# Patient Record
Sex: Male | Born: 1935 | Race: White | Hispanic: No | Marital: Married | Smoking: Former smoker
Health system: Southern US, Community
[De-identification: ages and names within clinical notes are randomized; demographics above are authoritative.]

## PROBLEM LIST (undated history)

## (undated) DIAGNOSIS — I358 Other nonrheumatic aortic valve disorders: Secondary | ICD-10-CM

## (undated) DIAGNOSIS — J189 Pneumonia, unspecified organism: Secondary | ICD-10-CM

## (undated) DIAGNOSIS — K219 Gastro-esophageal reflux disease without esophagitis: Secondary | ICD-10-CM

## (undated) DIAGNOSIS — J349 Unspecified disorder of nose and nasal sinuses: Secondary | ICD-10-CM

## (undated) DIAGNOSIS — I509 Heart failure, unspecified: Secondary | ICD-10-CM

## (undated) DIAGNOSIS — C787 Secondary malignant neoplasm of liver and intrahepatic bile duct: Secondary | ICD-10-CM

## (undated) DIAGNOSIS — I517 Cardiomegaly: Secondary | ICD-10-CM

## (undated) DIAGNOSIS — Z9981 Dependence on supplemental oxygen: Secondary | ICD-10-CM

## (undated) DIAGNOSIS — I10 Essential (primary) hypertension: Secondary | ICD-10-CM

## (undated) DIAGNOSIS — C3491 Malignant neoplasm of unspecified part of right bronchus or lung: Secondary | ICD-10-CM

## (undated) DIAGNOSIS — R202 Paresthesia of skin: Secondary | ICD-10-CM

## (undated) DIAGNOSIS — D509 Iron deficiency anemia, unspecified: Secondary | ICD-10-CM

## (undated) DIAGNOSIS — N179 Acute kidney failure, unspecified: Secondary | ICD-10-CM

## (undated) DIAGNOSIS — H9190 Unspecified hearing loss, unspecified ear: Secondary | ICD-10-CM

## (undated) DIAGNOSIS — D649 Anemia, unspecified: Secondary | ICD-10-CM

## (undated) DIAGNOSIS — G43909 Migraine, unspecified, not intractable, without status migrainosus: Secondary | ICD-10-CM

## (undated) DIAGNOSIS — Z7189 Other specified counseling: Secondary | ICD-10-CM

## (undated) DIAGNOSIS — R0600 Dyspnea, unspecified: Secondary | ICD-10-CM

## (undated) DIAGNOSIS — Z9289 Personal history of other medical treatment: Secondary | ICD-10-CM

## (undated) DIAGNOSIS — Z95828 Presence of other vascular implants and grafts: Secondary | ICD-10-CM

## (undated) DIAGNOSIS — I340 Nonrheumatic mitral (valve) insufficiency: Secondary | ICD-10-CM

## (undated) HISTORY — DX: Unspecified hearing loss, unspecified ear: H91.90

## (undated) HISTORY — DX: Other specified counseling: Z71.89

## (undated) HISTORY — PX: CATARACT EXTRACTION W/ INTRAOCULAR LENS  IMPLANT, BILATERAL: SHX1307

## (undated) HISTORY — DX: Unspecified disorder of nose and nasal sinuses: J34.9

## (undated) HISTORY — PX: LIVER BIOPSY: SHX301

## (undated) HISTORY — DX: Malignant neoplasm of unspecified part of right bronchus or lung: C34.91

## (undated) HISTORY — PX: TONSILLECTOMY: SUR1361

## (undated) HISTORY — PX: COLONOSCOPY: SHX174

## (undated) HISTORY — PX: VASECTOMY: SHX75

## (undated) HISTORY — DX: Secondary malignant neoplasm of liver and intrahepatic bile duct: C78.7

---

## 2013-11-03 ENCOUNTER — Ambulatory Visit (INDEPENDENT_AMBULATORY_CARE_PROVIDER_SITE_OTHER): Payer: Medicare Other

## 2013-11-03 VITALS — BP 155/84 | HR 79 | Resp 18

## 2013-11-03 DIAGNOSIS — M2040 Other hammer toe(s) (acquired), unspecified foot: Secondary | ICD-10-CM

## 2013-11-03 DIAGNOSIS — Q828 Other specified congenital malformations of skin: Secondary | ICD-10-CM

## 2013-11-03 NOTE — Progress Notes (Signed)
   Subjective:    Patient ID: Harry Hinton, male    DOB: 05-19-1933, 80 y.o.   MRN: [id number]  HPI I have a 3rd toe on right foot and I went to doctor and he shaved it off and then it came back and it has been 5 years ago and painful with shoes and I have tried everything over the counter    Review of Systems  Constitutional: Negative.   HENT: Positive for hearing loss and sinus pressure.   Eyes: Negative.   Respiratory: Negative.   Cardiovascular: Negative.   Gastrointestinal:       Bloating  Endocrine: Negative.   Genitourinary: Negative.   Musculoskeletal: Negative.   Skin: Negative.   Allergic/Immunologic: Negative.   Neurological: Negative.   Hematological: Bruises/bleeds easily.  Psychiatric/Behavioral: Negative.        Objective:   Physical Exam Neurovascular status is intact with pedal pulses palpable DP postal for PT +24 bilateral Refill time 3 seconds all digits neurologically epicritic and progressive sensations intact and symmetric bilateral. There is normal plantar response DTRs not elicited dermatologically there is a distal clavus and keratoses distal aspect third digit right foot slight flexible contractures identified. Should note on the right foot is second third toes are significant longer than the hallux is slight bunion and the hallux as well the left foot has a slightly shorter second and third toe comparison to the hallux. Patient has no significant promontory changes has been wearing a size 9-1/2 or 10 shoe in the past however this time measurements taken patient is actually a size 10 a half to on more accurately an 11 D. or 11 medium shoe. Patient is advised she is to allow for space the end of the shoe from his longest toe not the great toe. No secondary infections no is a size lymphangitis although there was suspicion of a verruca this does not appear to have enucleation or verruca at this time although cannot rule out without it actually biopsy or excision.  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

## 2013-11-03 NOTE — Patient Instructions (Signed)
Corns and Calluses A thickening of the skin layer (usually over bony areas, such as toe joints) is known as a corn. Two types of corns exist: hard corns and soft corns. Calluses are painless areas of skin thickening that are caused by repeated pressure or irritation. Corns tend to affect toe joints and the skin between the toes; whereas, a callus can appear on any part of the body (especially the hands, feet, or knees).  SYMPTOMS   Corn:  Presence of a small (1/8 to 3/8 inch [3 to 10 mm in diameter]), painful bump on the side or over the joint of a toe.  Hard corns are more common on the outer portion of the little (fifth) toe at the joint.  Soft corns are more common between bony bumps (prominences), usually between the fourth and fifth toes or between the second and third toes.  Callus:  A rough, thickened area of skin that appears after repeated pressure or irritation. CAUSES  The purpose of corns and calluses is to protect an area of skin from injury caused by repeated irritation (rubbing or squeezing). The presence of pressure causes the skin cells to grow at a faster rate than the cells of unaffected areas. This leads to an overgrowth (corn or callus). As apposed to hard corns, soft corns tend to develop between toes, because there is more moisture. Soft corns are often the result of prolonged shoe wear, which leads to increased perspiration and moisture.  RISK INCREASES WITH:  Shoes that are too tight.  Occupations or sports that involve repetitive pressure on the hands (racquetball and baseball) or sudden stops on hard surfaces (track and tennis).  Sports that require the athlete to wear shoes, perspire, or wear clothing or protective gear that causes the production of heat and friction. PREVENTION  Properly fitted shoes and equipment.  Modify activities to prevent constant pressure on specific areas of skin.  If possible, wear padding over areas of skin that are exposed to  repeated pressure or irritation.  Keep the area between the toes dry (with powder or by removing shoes often).  Relieve shoe pressure by stretching the areas of the shoe that cause the pressure and or use ointments to soften leather shoes. PROGNOSIS  Corns and calluses typically subside if the activity that causes them is eliminated. Recovery may take up to 3 weeks. Recurrence is likely even with treatment if the cause is not removed.  RELATED COMPLICATIONS  If one overcompensates in an attempt to avoid pain, he or she may experience pain in other areas due to the changes in body movements (mechanics). TREATMENT  The best way to treat corns and calluses is to remove the source of pressure. Corn and callus pads may be helpful in reducing pressure on the affected skin. For soft corns, try to keep the affected area dry. If you cannot find shoes that fit properly, a shoe repair shop may be able to alter your shoes to reduce pressure. Occasionally a cushion for the bottom of the foot (metatarsal bar) worn within the shoe may relieve pressure on corns or calluses of the foot. For calluses, you may be able to peel or rub the thickened area with a pumice stone, sandstone, callus file, or with sandpaper to remove the callus; wetting the affected area may make this process more effective. Do not cut the corn or callus with a razor or knife. If the corn or callus must be removed, then a medically trained person should perform  the procedure. After peeling away the upper layers of a corn once or twice a day, it may be recommended to apply a non-prescription 5% to 84% salicylic ointment and cover the area with a bandage. It very uncommon to have the bony bumps (at toe joints) surgically removed. MEDICATION   If pain medication is necessary, nonsteroidal anti-inflammatory medications, such as aspirin and ibuprofen, or other minor pain relievers, such as acetaminophen, are often recommended. Contact your caregiver  immediately if any bleeding, stomach upset, or signs of an allergic reaction occur.  Topical salicylic ointments (5% to 10%) may be of benefit.  Prescription pain medications may be given by a caregiver. Use only as directed and only as much as you need.  Soak the foot for 20 minutes, twice a day, in a gallon of warm water. This may help to soften corns and calluses. Care should be taken to thoroughly dry the foot, especially between the toes, after soaking. SEEK MEDICAL CARE IF:   Symptoms get worse or do not improve in 2 weeks despite treatment.  Any signs of infection develop, including redness, swelling, increased pain or tenderness, or increased warmth around the corn or callus.  New, unexplained symptoms develop (drugs used in treatment may produce side effects). Document Released: 09/29/2005 Document Revised: 12/22/2011 Document Reviewed: 01/11/2009 [facility] Patient Information 2014 [place], [place].

## 2015-01-04 DIAGNOSIS — H43813 Vitreous degeneration, bilateral: Secondary | ICD-10-CM | POA: Diagnosis not present

## 2015-01-04 DIAGNOSIS — H5203 Hypermetropia, bilateral: Secondary | ICD-10-CM | POA: Diagnosis not present

## 2015-03-30 DIAGNOSIS — H25811 Combined forms of age-related cataract, right eye: Secondary | ICD-10-CM | POA: Diagnosis not present

## 2015-03-30 DIAGNOSIS — H04123 Dry eye syndrome of bilateral lacrimal glands: Secondary | ICD-10-CM | POA: Diagnosis not present

## 2015-04-10 DIAGNOSIS — Z87891 Personal history of nicotine dependence: Secondary | ICD-10-CM | POA: Diagnosis not present

## 2015-04-10 DIAGNOSIS — H25811 Combined forms of age-related cataract, right eye: Secondary | ICD-10-CM | POA: Diagnosis not present

## 2015-04-10 DIAGNOSIS — H9190 Unspecified hearing loss, unspecified ear: Secondary | ICD-10-CM | POA: Diagnosis not present

## 2015-04-10 DIAGNOSIS — Z7982 Long term (current) use of aspirin: Secondary | ICD-10-CM | POA: Diagnosis not present

## 2015-04-10 DIAGNOSIS — H259 Unspecified age-related cataract: Secondary | ICD-10-CM | POA: Diagnosis not present

## 2015-05-22 DIAGNOSIS — H269 Unspecified cataract: Secondary | ICD-10-CM | POA: Diagnosis not present

## 2015-05-22 DIAGNOSIS — H25812 Combined forms of age-related cataract, left eye: Secondary | ICD-10-CM | POA: Diagnosis not present

## 2015-05-22 DIAGNOSIS — Z7982 Long term (current) use of aspirin: Secondary | ICD-10-CM | POA: Diagnosis not present

## 2015-05-22 DIAGNOSIS — Z87891 Personal history of nicotine dependence: Secondary | ICD-10-CM | POA: Diagnosis not present

## 2015-05-22 DIAGNOSIS — H9190 Unspecified hearing loss, unspecified ear: Secondary | ICD-10-CM | POA: Diagnosis not present

## 2015-06-21 DIAGNOSIS — Z961 Presence of intraocular lens: Secondary | ICD-10-CM | POA: Diagnosis not present

## 2016-03-20 DIAGNOSIS — H353111 Nonexudative age-related macular degeneration, right eye, early dry stage: Secondary | ICD-10-CM | POA: Diagnosis not present

## 2016-03-20 DIAGNOSIS — Z961 Presence of intraocular lens: Secondary | ICD-10-CM | POA: Diagnosis not present

## 2016-11-03 DIAGNOSIS — Z961 Presence of intraocular lens: Secondary | ICD-10-CM | POA: Diagnosis not present

## 2016-11-03 DIAGNOSIS — H5203 Hypermetropia, bilateral: Secondary | ICD-10-CM | POA: Diagnosis not present

## 2017-03-09 ENCOUNTER — Encounter (HOSPITAL_COMMUNITY): Payer: Self-pay | Admitting: *Deleted

## 2017-03-09 ENCOUNTER — Inpatient Hospital Stay (HOSPITAL_COMMUNITY)
Admission: AD | Admit: 2017-03-09 | Discharge: 2017-03-12 | DRG: 166 | Disposition: A | Discharge status: Home / Self Care | Payer: Medicare Other | Source: Other Acute Inpatient Hospital | Attending: Internal Medicine | Admitting: Internal Medicine

## 2017-03-09 ENCOUNTER — Inpatient Hospital Stay (HOSPITAL_COMMUNITY): Payer: Medicare Other

## 2017-03-09 DIAGNOSIS — R918 Other nonspecific abnormal finding of lung field: Secondary | ICD-10-CM | POA: Diagnosis not present

## 2017-03-09 DIAGNOSIS — J9811 Atelectasis: Secondary | ICD-10-CM | POA: Diagnosis not present

## 2017-03-09 DIAGNOSIS — I744 Embolism and thrombosis of arteries of extremities, unspecified: Secondary | ICD-10-CM | POA: Diagnosis not present

## 2017-03-09 DIAGNOSIS — C3490 Malignant neoplasm of unspecified part of unspecified bronchus or lung: Secondary | ICD-10-CM | POA: Diagnosis not present

## 2017-03-09 DIAGNOSIS — C3402 Malignant neoplasm of left main bronchus: Secondary | ICD-10-CM | POA: Diagnosis present

## 2017-03-09 DIAGNOSIS — Z7982 Long term (current) use of aspirin: Secondary | ICD-10-CM

## 2017-03-09 DIAGNOSIS — R42 Dizziness and giddiness: Secondary | ICD-10-CM

## 2017-03-09 DIAGNOSIS — I358 Other nonrheumatic aortic valve disorders: Secondary | ICD-10-CM

## 2017-03-09 DIAGNOSIS — D638 Anemia in other chronic diseases classified elsewhere: Secondary | ICD-10-CM | POA: Diagnosis present

## 2017-03-09 DIAGNOSIS — H9190 Unspecified hearing loss, unspecified ear: Secondary | ICD-10-CM | POA: Diagnosis present

## 2017-03-09 DIAGNOSIS — E875 Hyperkalemia: Secondary | ICD-10-CM | POA: Diagnosis present

## 2017-03-09 DIAGNOSIS — R896 Abnormal cytological findings in specimens from other organs, systems and tissues: Secondary | ICD-10-CM | POA: Diagnosis not present

## 2017-03-09 DIAGNOSIS — I160 Hypertensive urgency: Secondary | ICD-10-CM | POA: Diagnosis not present

## 2017-03-09 DIAGNOSIS — R590 Localized enlarged lymph nodes: Secondary | ICD-10-CM | POA: Diagnosis not present

## 2017-03-09 DIAGNOSIS — Z720 Tobacco use: Secondary | ICD-10-CM

## 2017-03-09 DIAGNOSIS — J189 Pneumonia, unspecified organism: Secondary | ICD-10-CM | POA: Diagnosis not present

## 2017-03-09 DIAGNOSIS — I7102 Dissection of abdominal aorta: Secondary | ICD-10-CM | POA: Diagnosis not present

## 2017-03-09 DIAGNOSIS — J9809 Other diseases of bronchus, not elsewhere classified: Secondary | ICD-10-CM | POA: Diagnosis not present

## 2017-03-09 DIAGNOSIS — I10 Essential (primary) hypertension: Secondary | ICD-10-CM | POA: Diagnosis not present

## 2017-03-09 DIAGNOSIS — Z87891 Personal history of nicotine dependence: Secondary | ICD-10-CM | POA: Diagnosis not present

## 2017-03-09 DIAGNOSIS — R0789 Other chest pain: Secondary | ICD-10-CM | POA: Diagnosis not present

## 2017-03-09 DIAGNOSIS — N179 Acute kidney failure, unspecified: Secondary | ICD-10-CM | POA: Diagnosis not present

## 2017-03-09 DIAGNOSIS — R072 Precordial pain: Secondary | ICD-10-CM | POA: Diagnosis not present

## 2017-03-09 DIAGNOSIS — C779 Secondary and unspecified malignant neoplasm of lymph node, unspecified: Secondary | ICD-10-CM | POA: Diagnosis present

## 2017-03-09 DIAGNOSIS — C3432 Malignant neoplasm of lower lobe, left bronchus or lung: Secondary | ICD-10-CM | POA: Diagnosis not present

## 2017-03-09 DIAGNOSIS — R042 Hemoptysis: Secondary | ICD-10-CM | POA: Diagnosis present

## 2017-03-09 DIAGNOSIS — I714 Abdominal aortic aneurysm, without rupture, unspecified: Secondary | ICD-10-CM

## 2017-03-09 DIAGNOSIS — E860 Dehydration: Secondary | ICD-10-CM | POA: Diagnosis not present

## 2017-03-09 DIAGNOSIS — I2510 Atherosclerotic heart disease of native coronary artery without angina pectoris: Secondary | ICD-10-CM | POA: Diagnosis not present

## 2017-03-09 DIAGNOSIS — E871 Hypo-osmolality and hyponatremia: Secondary | ICD-10-CM | POA: Diagnosis not present

## 2017-03-09 DIAGNOSIS — I77811 Abdominal aortic ectasia: Secondary | ICD-10-CM | POA: Diagnosis not present

## 2017-03-09 DIAGNOSIS — I739 Peripheral vascular disease, unspecified: Secondary | ICD-10-CM | POA: Diagnosis not present

## 2017-03-09 DIAGNOSIS — R079 Chest pain, unspecified: Secondary | ICD-10-CM | POA: Diagnosis not present

## 2017-03-09 HISTORY — DX: Other nonrheumatic aortic valve disorders: I35.8

## 2017-03-09 LAB — VITAMIN B12: Vitamin B-12: 409 pg/mL (ref 180–914)

## 2017-03-09 LAB — COMPREHENSIVE METABOLIC PANEL
ALBUMIN: 3.1 g/dL — AB (ref 3.5–5.0)
ALT: 14 U/L — ABNORMAL LOW (ref 17–63)
ANION GAP: 6 (ref 5–15)
AST: 19 U/L (ref 15–41)
Alkaline Phosphatase: 88 U/L (ref 38–126)
BILIRUBIN TOTAL: 0.3 mg/dL (ref 0.3–1.2)
BUN: 20 mg/dL (ref 6–20)
CO2: 28 mmol/L (ref 22–32)
Calcium: 8.4 mg/dL — ABNORMAL LOW (ref 8.9–10.3)
Chloride: 101 mmol/L (ref 101–111)
Creatinine, Ser: 1.18 mg/dL (ref 0.61–1.24)
GFR calc Af Amer: >60 mL/min (ref >60)
GFR calc non Af Amer: 56 mL/min — ABNORMAL LOW (ref >60)
GLUCOSE: 116 mg/dL — AB (ref 65–99)
POTASSIUM: 4.4 mmol/L (ref 3.5–5.1)
SODIUM: 135 mmol/L (ref 135–145)
Total Protein: 8 g/dL (ref 6.5–8.1)

## 2017-03-09 LAB — PROTIME-INR
INR: 1.03
Prothrombin Time: 13.5 seconds (ref 11.4–15.2)

## 2017-03-09 LAB — TSH: TSH: 1.295 u[IU]/mL (ref 0.350–4.500)

## 2017-03-09 LAB — CBC
HCT: 35.9 % — ABNORMAL LOW (ref 39.0–52.0)
Hemoglobin: 11.8 g/dL — ABNORMAL LOW (ref 13.0–17.0)
MCH: 28.3 pg (ref 26.0–34.0)
MCHC: 32.9 g/dL (ref 30.0–36.0)
MCV: 86.1 fL (ref 78.0–100.0)
Platelets: 266 10*3/uL (ref 150–400)
RBC: 4.17 MIL/uL — ABNORMAL LOW (ref 4.22–5.81)
RDW: 14.1 % (ref 11.5–15.5)
WBC: 10.6 10*3/uL — ABNORMAL HIGH (ref 4.0–10.5)

## 2017-03-09 LAB — IRON AND TIBC
IRON: 28 ug/dL — AB (ref 45–182)
Saturation Ratios: 11 % — ABNORMAL LOW (ref 17.9–39.5)
TIBC: 246 ug/dL — ABNORMAL LOW (ref 250–450)
UIBC: 218 ug/dL

## 2017-03-09 LAB — LACTATE DEHYDROGENASE: LDH: 126 U/L (ref 98–192)

## 2017-03-09 LAB — RETICULOCYTES
RBC.: 4.17 MIL/uL — ABNORMAL LOW (ref 4.22–5.81)
RETIC COUNT ABSOLUTE: 50 10*3/uL (ref 19.0–186.0)
Retic Ct Pct: 1.2 % (ref 0.4–3.1)

## 2017-03-09 LAB — FOLATE: Folate: 37.5 ng/mL (ref >5.9)

## 2017-03-09 LAB — FERRITIN: FERRITIN: 107 ng/mL (ref 24–336)

## 2017-03-09 MED ORDER — ALBUTEROL SULFATE (2.5 MG/3ML) 0.083% IN NEBU: 2.5000 mg | INHALATION_SOLUTION | RESPIRATORY_TRACT | Status: DC | PRN

## 2017-03-09 MED ORDER — HYDROCODONE-ACETAMINOPHEN 5-325 MG PO TABS: 1.0000 | ORAL_TABLET | ORAL | Status: DC | PRN

## 2017-03-09 MED ORDER — NITROGLYCERIN 0.4 MG SL SUBL: 0.4000 mg | SUBLINGUAL_TABLET | SUBLINGUAL | Status: DC | PRN

## 2017-03-09 MED ORDER — ONDANSETRON HCL 4 MG/2ML IJ SOLN: 4.0000 mg | Freq: Four times a day (QID) | INTRAMUSCULAR | Status: DC | PRN

## 2017-03-09 MED ORDER — AMLODIPINE BESYLATE 10 MG PO TABS
10.0000 mg | ORAL_TABLET | Freq: Every day | ORAL | Status: DC
  Administered 2017-03-09 – 2017-03-12 (×3): 10 mg via ORAL
  Filled 2017-03-09 (×3): qty 1

## 2017-03-09 MED ORDER — CARVEDILOL 3.125 MG PO TABS
3.1250 mg | ORAL_TABLET | Freq: Two times a day (BID) | ORAL | Status: DC
  Administered 2017-03-09 – 2017-03-12 (×6): 3.125 mg via ORAL
  Filled 2017-03-09 (×6): qty 1

## 2017-03-09 MED ORDER — ONDANSETRON HCL 4 MG PO TABS: 4.0000 mg | ORAL_TABLET | Freq: Four times a day (QID) | ORAL | Status: DC | PRN

## 2017-03-09 MED ORDER — HEPARIN SODIUM (PORCINE) 5000 UNIT/ML IJ SOLN
5000.0000 [IU] | Freq: Three times a day (TID) | INTRAMUSCULAR | Status: DC
  Administered 2017-03-09 – 2017-03-11 (×6): 5000 [IU] via SUBCUTANEOUS
  Filled 2017-03-09 (×6): qty 1

## 2017-03-09 NOTE — Consult Note (Signed)
PULMONARY / CRITICAL CARE MEDICINE   Name: Harry Hinton MRN: [id number] DOB: March 29, 1933    ADMISSION DATE:  03/09/2017 CONSULTATION DATE:  03/09/17  REFERRING MD:  [facility]  CHIEF COMPLAINT:  Lung mass  HISTORY OF PRESENT ILLNESS:   43 yowm with remote smoking hx and atypical chest and abd pains fleeting in nature x one month PTA with spell of light headedness to [employer] with w/u there Pos L hilar mass with L PA involvement.  He has had sev episodes over the last month of traces of blood when he clears his throat assoc with mild epistaxis but no frank hemoptysis/ mucus plugs or excess/ purulent sputum and quite active and still playing golf one week pta.   No obvious day to day or daytime variability or assoc sob or classically pleuritc or ex  cp or chest tightness, subjective wheeze or overt sinus or hb symptoms. No unusual exp hx or h/o childhood pna/ asthma or knowledge of premature birth.  Sleeping ok without nocturnal  or early am exacerbation  of respiratory  c/o's or need for noct saba. Also denies any obvious fluctuation of symptoms with weather or environmental changes or other aggravating or alleviating factors except as outlined above   Current Medications, Allergies, Complete Past Medical History, Past Surgical History, Family History, and Social History were reviewed in [employer] record.  ROS  The following are not active complaints unless bolded sore throat, dysphagia, dental problems, itching, sneezing,  nasal congestion or excess/ purulent secretions, ear ache,   fever, chills, sweats, unintended wt loss, classically pleuritic or exertional cp,  orthopnea pnd or leg swelling, presyncope, palpitations, abdominal pain, anorexia, nausea, vomiting, diarrhea  or change in bowel or bladder habits, change in stools or urine, dysuria,hematuria,  rash, arthralgias, visual complaints, headache, numbness, weakness or ataxia or problems with walking or  coordination,  change in mood/affect or memory.         PAST MEDICAL HISTORY :  He  has a past medical history of Hearing loss and Sinus problem.  PAST SURGICAL HISTORY: He  has no past surgical history on file.  No Known Allergies  No current facility-administered medications on file prior to encounter.    No current outpatient prescriptions on file prior to encounter.    FAMILY HISTORY:  His has no family status information on file.    SOCIAL HISTORY: He  reports that he has never smoked. He has never used smokeless tobacco. He reports that he drinks alcohol. He reports that he does not use drugs.     SUBJECTIVE:  Comfortable at 45 degrees HOB on RA   VITAL SIGNS: BP 112/67 (BP Location: Right Arm)   Pulse 78   Temp 98 F (36.7 C) (Oral)   Resp 18   Ht 6\' 1"  (1.854 m)   Wt 182 lb 8.7 oz (82.8 kg)   SpO2 97%   BMI 24.08 kg/m   HEMODYNAMICS:    VENTILATOR SETTINGS:    INTAKE / OUTPUT: No intake/output data recorded.  PHYSICAL EXAMINATION: General:  Well preserved, nad  Wt Readings from Last 3 Encounters:  03/09/17 182 lb 8.7 oz (82.8 kg)    Vital signs reviewed  HEENT: nl dentition, turbinates bilaterally, and oropharynx. Nl external ear canals without cough reflex   NECK :  without JVD/Nodes/TM/ nl carotid upstrokes bilaterally   LUNGS: no acc muscle use,  Nl contour chest which is clear to A and P bilaterally without cough on insp or  exp maneuvers   CV:  RRR  no s3 or murmur or increase in P2, and no edema   ABD:  soft and nontender with nl inspiratory excursion in the supine position. No bruits or organomegaly appreciated, bowel sounds nl  MS:  Nl gait/ ext warm without deformities, calf tenderness, cyanosis or clubbing No obvious joint restrictions   SKIN: warm and dry without lesions    NEURO:  alert, approp, nl sensorium with  no motor or cerebellar deficits apparent.     LABS:  BMET  Recent Labs Lab 03/09/17 0928  NA 135  K  4.4  CL 101  CO2 28  BUN 20  CREATININE 1.18  GLUCOSE 116*    Electrolytes  Recent Labs Lab 03/09/17 0928  CALCIUM 8.4*    CBC  Recent Labs Lab 03/09/17 0928  WBC 10.6*  HGB 11.8*  HCT 35.9*  PLT 266    Coag's  Recent Labs Lab 03/09/17 0928  INR 1.03    Sepsis Markers No results for input(s): LATICACIDVEN, PROCALCITON, O2SATVEN in the last 168 hours.  ABG No results for input(s): PHART, PCO2ART, PO2ART in the last 168 hours.  Liver Enzymes  Recent Labs Lab 03/09/17 0928  AST 19  ALT 14*  ALKPHOS 88  BILITOT 0.3  ALBUMIN 3.1*    Cardiac Enzymes No results for input(s): TROPONINI, PROBNP in the last 168 hours.  Glucose No results for input(s): GLUCAP in the last 168 hours.  Imaging No results found.   I personally reviewed images and agree with radiology impression as follows:  CT available on canopy 5/28 L hilar mass involving AP window and ? 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

## 2017-03-09 NOTE — H&P (Addendum)
TRH H&P   Patient Demographics:    Harry Hinton, is a 83 y.o. male  MRN: [id number]   DOB - 07/20/1933  Admit Date - 03/09/2017  Outpatient Primary MD for the patient is Harry Broker, MD      Patient coming from: [facility]  No chief complaint on file.     HPI:    Harry Hinton  is a 83 y.o. male, With no previous medical problems, has not seen a physician in many years, and on no home medications, does smoke one pack of cigarettes daily quit about 2 weeks ago, has had issues with mild chronic cough with 2 episodes of hemoptysis about 2 weeks ago. He basically seek medical attention today after experiencing some lightheadedness this morning associated with weight chest and left-sided scapular/shoulder pain ongoing for 12-14 hours. 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  [facility] ER physician discussed the case with pulmonary physician on call Dr. Lake Hinton who requested the patient to be transferred to [facility] ER under hospitalist service. Patient currently symptom-free.    Review of systems:    In addition to the HPI above, Currently negative No Fever-chills, No Headache, No changes with Vision or hearing, No problems swallowing food or Liquids, No Chest pain, Cough or Shortness of Breath, No Abdominal pain, No  Nausea or Vommitting, Bowel movements are regular, No Blood in stool or Urine, No dysuria, No new skin rashes or bruises, No new joints pains-aches,  No new weakness, tingling, numbness in any extremity, No recent weight gain or loss, No polyuria, polydypsia or polyphagia, No significant Mental Stressors.  A full 10 point Review of Systems was done, except as stated above, all other Review of Systems were negative.   With Past History of the following :    Past Medical History:  Diagnosis Date  . Hearing loss   . Sinus problem       No past surgical history on file.    Social History:  Social History  Substance Use Topics  . Smoking status: Never Smoker  . Smokeless tobacco: Never Used  . Alcohol use Yes         Family History :   No family history of lung cancer, sister had breast cancer   Home Medications:   Prior to Admission medications   Medication Sig Start Date End Date Taking? Authorizing Provider  aspirin EC 81 MG tablet Take 81 mg by mouth daily.   Yes [provider]  Multiple Vitamins-Minerals (CENTRUM SILVER 50+MEN) TABS Take 1 tablet by mouth daily.   Yes [provider]  vitamin E 200 UNIT capsule Take 200 Units by mouth daily.   Yes [provider]     Allergies:    No Known Allergies   Physical Exam:   Vitals  Blood pressure (!) 150/75, pulse 79, temperature 97.6 F (36.4 C), temperature source Oral, height 6\' 1"  (1.854 m), weight 82.8 kg (182 lb 8.7 oz), SpO2 99 %.   1. General Middle-aged well-built white male lying in bed in NAD,     2. Normal affect and insight, Not Suicidal or Homicidal, Awake Alert, Oriented X 3.  3. No F.N deficits, ALL C.Nerves Intact, Strength 5/5 all 4 extremities, Sensation intact all 4 extremities, Plantars down going.  4. Ears and Eyes appear Normal, Conjunctivae clear, PERRLA. Moist Oral Mucosa.  5. Supple Neck, No JVD, No cervical lymphadenopathy appriciated, No Carotid  Bruits.  6. Symmetrical Chest wall movement, Good air movement bilaterally, CTAB.  7. RRR, No Gallops, Rubs or Murmurs, No Parasternal Heave.  8. Positive Bowel Sounds, Abdomen Soft, No tenderness, No organomegaly appriciated,No rebound -guarding or rigidity.  9.  No Cyanosis, Normal Skin Turgor, No Skin Rash or Bruise.  10. Good muscle tone,  joints appear normal , no effusions, Normal ROM.  11. No Palpable Lymph Nodes in Neck or Axillae      Data Review:   Labs at [facility] white count 8.8 hemoglobin 11.4 hematocrit 34.4 platelets 243, sodium 139 potassium 4.6, chloride 101 bicarbonate 28, BUN 22 creatinine 1.1, glucose 107. INR was 1.  EKG was reported as unremarkable.  ---------------------------------------------------------------------------------------------------------------  Urinalysis No results found for: COLORURINE, APPEARANCEUR, LABSPEC, [place], GLUCOSEU, HGBUR, BILIRUBINUR, KETONESUR, PROTEINUR, UROBILINOGEN, NITRITE, LEUKOCYTESUR  ----------------------------------------------------------------------------------------------------------------   Imaging Results:    No results found.  Baseline EKG ordered   Assessment & Plan:     1. Atypical chest pain due to left-sided hilar mass suspicious for primary bronchogenic lung cancer with tumor invasion into left pulmonary artery and 4 simple aortopulmonary metastatic lymphadenopathy.  Have called Dr. Shyrl Hinton pulmonary & Dr Harry Hinton Onc to evaluate the patient, most likely will require palliative radiation treatments, will involve oncology tomorrow, patient is guarded as tumor has already invaded into pulmonary artery. This was explained to the patient. I am ordering heparin for DVT prophylaxis with caution (ok by Oncology) will request pulmonary to comment on this if needed.  2. Hypertension. Placed on Coreg and Norvasc monitor.  3. Dizziness. Likely due to hypertensive urgency upon arrival, blood pressure control,  completely symptom-free now, if reoccurs will do MRI head to rule out any metastatic disease.  4. Smoking. Counseled to quit.  5. Anemia of chronic disease. Anemia panel ordered along with LDH.    DVT Prophylaxis Heparin with caution  AM Labs Ordered, also please review Full Orders  Family Communication: Admission, patients condition and plan of care including tests being ordered have been discussed with the patient and  who  indicates understanding and agree with the plan and Code Status.  Code Status Full  Likely DC to  TBD  Condition GUARDED  +++  Consults called: PCCM    Admission status: Inpt    Time spent in minutes : 30   Lala Lund M.D on 03/09/2017 at 9:36 AM  Between 7am to 7pm - Pager - [phone] ( page via [facility], text pages only, please mention full 10 digit call back number).  After 7pm go to [email or web address] - password [email or web address]  [facility] - Office  [phone]

## 2017-03-09 NOTE — Plan of Care (Signed)
83 year old male who has not been to a physician for many years presents to the ER at [facility] with chest pain. Patient is also found to be hypertensive and a nitroglycerin patch was placed following which blood pressure improved. CT angiogram of the chest abdomen and pelvis was done which shows lung mass invading the pulmonary artery. ER physician had discussed with pulmonologist at [facility]. Patient will be admitted to [facility] for further management of lung mass most likely malignant. As per ER physician patient is hemodynamically stable at transfer. Please notify PCCM after patient arrives.  Gean Birchwood.

## 2017-03-09 NOTE — Progress Notes (Signed)
Attempted calls to MRI & [place], no answer. Will f/u.

## 2017-03-09 NOTE — Consult Note (Addendum)
Referral MD  Reason for Referral: Left hilar mass with tumor thrombus extending into the left pulmonary artery   No chief complaint on file. : I feel good. I don't know what could be wrong.  HPI: Harry Hinton is a very nice 83 year old white male. He was in the [employer] for 4 years. He was in between the [place] War and [place] War. He is a true [place] hero for serving our country.  He gets his care at the [facility] hospital. He was going down to [facility]. He now will be going up to the [place] to reestablish himself. He is supposed to have an appointment on Friday.  He says been having some chest wall pressure. He had some lightheadedness. He said he coughed up a little bit of blood on 2 occasions.  He had no obvious shortness of breath. He is had no travel history. He used to work in [employer] but did not have any obvious exposure.  He thinks he may have had some asbestos exposure in the [employer].  He stopped smoking back in 1987. He probably has a 35-pack-year history of tobacco use. Says that on occasion he still may have smoked.  He does live in [place]. He went to the ER. I have no way of accessing the CT scan that was done. However, on the very thorough H&P by Dr. Candiss Norse, it is mentioned that a CT scan was done which showed a left hilar mass suspicious for bronchogenic cancer with a tumor thrombus extending into the left pulmonary artery. There might be possible chronic aortic dissection at the distal abdominal aorta with a mural thrombus. Enlarged AP window nodes were noted.  He was transferred up to [facility]. He has seen Dr. Melvyn Novas of pulmonary medicine. I think that he will undergo a bronchoscopy tomorrow.  He's had no weight loss. He's had no fever. He's had no headache. He plays golf once a week.  I must say that he looks incredibly fit. As such, aggressive intervention can be entertained.  His appetite is okay. He's had no nausea or vomiting. He's had no rashes.  Overall, his  performance status is ECOG 0.    Past Medical History:  Diagnosis Date  . Hearing loss   . Sinus problem   :  No past surgical history on file.:   Current Facility-Administered Medications:  .  albuterol (PROVENTIL) (2.5 MG/3ML) 0.083% nebulizer solution 2.5 mg, 2.5 mg, Nebulization, Q4H PRN, Lala Lund K, MD .  amLODipine (NORVASC) tablet 10 mg, 10 mg, Oral, Daily, Thurnell Lose, MD, 10 mg at 03/09/17 1100 .  carvedilol (COREG) tablet 3.125 mg, 3.125 mg, Oral, BID WC, Lala Lund K, MD, 3.125 mg at 03/09/17 1100 .  heparin injection 5,000 Units, 5,000 Units, Subcutaneous, Q8H, Thurnell Lose, MD, 5,000 Units at 03/09/17 1100 .  HYDROcodone-acetaminophen (NORCO/VICODIN) 5-325 MG per tablet 1-2 tablet, 1-2 tablet, Oral, Q4H PRN, Thurnell Lose, MD .  nitroGLYCERIN (NITROSTAT) SL tablet 0.4 mg, 0.4 mg, Sublingual, Q5 min PRN, Candiss Norse, Prashant K, MD .  ondansetron (ZOFRAN) tablet 4 mg, 4 mg, Oral, Q6H PRN **OR** ondansetron (ZOFRAN) injection 4 mg, 4 mg, Intravenous, Q6H PRN, Thurnell Lose, MD:  . amLODipine  10 mg Oral Daily  . carvedilol  3.125 mg Oral BID WC  . heparin subcutaneous  5,000 Units Subcutaneous Q8H  :  No Known Allergies:  No family history on file.:  Social History   Social History  . Marital status: Married  Spouse name: N/A  . Number of children: N/A  . Years of education: N/A   Occupational History  . Not on file.   Social History Main Topics  . Smoking status: Never Smoker  . Smokeless tobacco: Never Used  . Alcohol use Yes  . Drug use: No  . Sexual activity: Not on file   Other Topics Concern  . Not on file   Social History Narrative  . No narrative on file  :  Pertinent items are noted in HPI.  Exam: Patient Vitals for the past 24 hrs:  BP Temp Temp src Pulse Resp SpO2 Height Weight  03/09/17 0900 (!) 150/75 97.6 F (36.4 C) Oral 79 16 99 % 6\' 1"  (1.854 m) 182 lb 8.7 oz (82.8 kg)    As above    Recent  Labs  03/09/17 0928  WBC 10.6*  HGB 11.8*  HCT 35.9*  PLT 266    Recent Labs  03/09/17 0928  NA 135  K 4.4  CL 101  CO2 28  GLUCOSE 116*  BUN 20  CREATININE 1.18  CALCIUM 8.4*    Blood smear review:  None  Pathology: Pending     Assessment and Plan:  Harry Hinton is an 84 year old white male. He presents with a left hilar mass and tumor thrombus extending into the left pulmonary artery.  This certainly sounds like a malignancy. The real question is whether or not this is a bronchogenic carcinoma or if it is something different. Not many cancers can invade into the pulmonary vasculature. Bronchogenic carcinoma certainly can.  If this is bronchogenic carcinoma, we will have to decide if this is small cell or non-small cell lung cancer. Given his tobacco history, or lack of smoking for about 30 years, I would have to say that a non-small cell lung cancer would be more likely.  He clearly will need radiation therapy. Given his overall good health, I might even entertain combination chemotherapy and radiation therapy depending on the pathology.  If this is bronchogenic carcinoma, he will need an MRI of the brain.  A PET scan certainly would be helpful. However, this could not be done as an inpatient.  The aortic issue is a whole separate problem. I suppose that putting him on heparin would be okay for right now. He needs an abdominal ultrasound to better look at this problem. He may need vascular surgery to be involved.  I spoke to he and his son at length. I spent about an hour with them. Again, it really would be nice to have the CT scan.  I think MRI of the chest would help [place] out. Possibly, better definition can be obtained to see exactly what, or if, there truly is invasion into a pulmonary artery.  Again, for 83 years old, he looks incredibly fit. He plays golf weekly. He is incredibly active. As such, we can proceed aggressively.  Since he lives in [place], I would  really prefer that all of his treatments be done down there. It would be a whole lot easier for his wife if he could stay local. He may need some radiation therapy up here initially and then be transitioned down to [place]. Again, this is a somewhat complicated case.  He does have a very strong faith. This will definitely help him out.   Lattie Haw, MD  Darlyn Chamber 33:6

## 2017-03-10 ENCOUNTER — Inpatient Hospital Stay (HOSPITAL_COMMUNITY): Payer: Medicare Other

## 2017-03-10 DIAGNOSIS — R42 Dizziness and giddiness: Secondary | ICD-10-CM

## 2017-03-10 DIAGNOSIS — E871 Hypo-osmolality and hyponatremia: Secondary | ICD-10-CM

## 2017-03-10 DIAGNOSIS — N179 Acute kidney failure, unspecified: Secondary | ICD-10-CM

## 2017-03-10 DIAGNOSIS — Z720 Tobacco use: Secondary | ICD-10-CM

## 2017-03-10 DIAGNOSIS — I517 Cardiomegaly: Secondary | ICD-10-CM

## 2017-03-10 DIAGNOSIS — R072 Precordial pain: Secondary | ICD-10-CM

## 2017-03-10 HISTORY — DX: Acute kidney failure, unspecified: N17.9

## 2017-03-10 HISTORY — DX: Cardiomegaly: I51.7

## 2017-03-10 LAB — BASIC METABOLIC PANEL
ANION GAP: 8 (ref 5–15)
BUN: 23 mg/dL — ABNORMAL HIGH (ref 6–20)
CO2: 26 mmol/L (ref 22–32)
Calcium: 8.6 mg/dL — ABNORMAL LOW (ref 8.9–10.3)
Chloride: 99 mmol/L — ABNORMAL LOW (ref 101–111)
Creatinine, Ser: 1.37 mg/dL — ABNORMAL HIGH (ref 0.61–1.24)
GFR calc Af Amer: 54 mL/min — ABNORMAL LOW (ref >60)
GFR, EST NON AFRICAN AMERICAN: 47 mL/min — AB (ref >60)
GLUCOSE: 119 mg/dL — AB (ref 65–99)
POTASSIUM: 5 mmol/L (ref 3.5–5.1)
Sodium: 133 mmol/L — ABNORMAL LOW (ref 135–145)

## 2017-03-10 LAB — CBC
HEMATOCRIT: 36.3 % — AB (ref 39.0–52.0)
HEMOGLOBIN: 12 g/dL — AB (ref 13.0–17.0)
MCH: 28.4 pg (ref 26.0–34.0)
MCHC: 33.1 g/dL (ref 30.0–36.0)
MCV: 85.8 fL (ref 78.0–100.0)
Platelets: 294 10*3/uL (ref 150–400)
RBC: 4.23 MIL/uL (ref 4.22–5.81)
RDW: 14.1 % (ref 11.5–15.5)
WBC: 10.5 10*3/uL (ref 4.0–10.5)

## 2017-03-10 LAB — ECHOCARDIOGRAM COMPLETE
HEIGHTINCHES: 73 in
Weight: 2913.6 oz

## 2017-03-10 MED ORDER — SODIUM CHLORIDE 0.9 % IV SOLN
INTRAVENOUS | Status: DC
  Administered 2017-03-10 – 2017-03-11 (×2): via INTRAVENOUS

## 2017-03-10 NOTE — Progress Notes (Signed)
   [facility] PCCM  I extensively d/w pt and family risks of bronchoscopy given his co - morbidities ( CAD, HTN, Aneurysm. AKI).  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  Dr. Martha Clan  saw pt yesterday and he is off Tuesday pm.  If Dr. Martha Clan thinks that treatment will be palliative then pt will NOT want bronchoscopy.  If he thinks that treatment will be curative, then pt and family are OK with bronchoscopy.  Dr. Martha Clan will see him tomorrow.  Pt is scheduled for bronchoscopy with EBUS  at [facility] at 2:45 pm tomorrow with Dr. Lamonte Sakai  Plan : 1. Will let Dr. Martha Clan see pt in am and family will decide re: doing bronchoscopy or not 2. Keep pt NPO just in case he will need bronchoscopy tomorrow pm.  If family decides bronchoscopy tomorrow, will facilitate transfer to [place], MD 03/10/2017, 4:08 PM [facility] Pager [phone] After 3 pm or if no answer, call [phone]

## 2017-03-10 NOTE — Progress Notes (Signed)
PULMONARY / CRITICAL CARE MEDICINE   Name: Harry Hinton MRN: [id number] DOB: May 26, 1933    ADMISSION DATE:  03/09/2017 CONSULTATION DATE:  03/09/17  REFERRING MD:  [facility]  CHIEF COMPLAINT:  Lung mass  HISTORY OF PRESENT ILLNESS:   69 yowm with remote smoking hx and atypical chest and abd pains fleeting in nature x one month PTA with spell of light headedness to [employer] with w/u there Pos L hilar mass with L PA involvement.  He has had sev episodes over the last month of traces of blood when he clears his throat assoc with mild epistaxis but no frank hemoptysis/ mucus plugs or excess/ purulent sputum and quite active and still playing golf one week pta.   No obvious day to day or daytime variability or assoc sob or classically pleuritc or ex  cp or chest tightness, subjective wheeze or overt sinus or hb symptoms. No unusual exp hx or h/o childhood pna/ asthma or knowledge of premature birth.  Sleeping ok without nocturnal  or early am exacerbation  of respiratory  c/o's or need for noct saba. Also denies any obvious fluctuation of symptoms with weather or environmental changes or other aggravating or alleviating factors except as outlined above   Current Medications, Allergies, Complete Past Medical History, Past Surgical History, Family History, and Social History were reviewed in [employer] record.  ROS  The following are not active complaints unless bolded sore throat, dysphagia, dental problems, itching, sneezing,  nasal congestion or excess/ purulent secretions, ear ache,   fever, chills, sweats, unintended wt loss, classically pleuritic or exertional cp,  orthopnea pnd or leg swelling, presyncope, palpitations, abdominal pain, anorexia, nausea, vomiting, diarrhea  or change in bowel or bladder habits, change in stools or urine, dysuria,hematuria,  rash, arthralgias, visual complaints, headache, numbness, weakness or ataxia or problems with walking or  coordination,  change in mood/affect or memory.         SUBJECTIVE:  No issues overnight. Comfortable.  No subjective complaints.  BP better controlled   Vitals:  Vitals:   03/09/17 1800 03/09/17 2042 03/10/17 0424 03/10/17 1002  BP: (!) 153/75 (!) 150/75 (!) 160/92 (!) 162/88  Pulse: 78 79 75   Resp:  17 16   Temp:  98.2 F (36.8 C) 98.4 F (36.9 C)   TempSrc:  Oral Oral   SpO2:  96% (!) 64%   Weight:   82.6 kg (182 lb 1.6 oz)   Height:        Constitutional/General:  Pleasant, well-nourished, well-developed, not in any distress,  Comfortably seating.  Well kempt  Body mass index is 24.03 kg/m. Wt Readings from Last 3 Encounters:  03/10/17 82.6 kg (182 lb 1.6 oz)     HEENT: Pupils equal and reactive to light and accommodation. Anicteric sclerae. Normal nasal mucosa.   No oral  lesions,  mouth clear,  oropharynx clear, no postnasal drip. (-) Oral thrush. No dental caries.  Airway - Mallampati class III  Neck: No masses. Midline trachea. No JVD, (-) LAD. (-) bruits appreciated.  Respiratory/Chest: Grossly normal chest. (-) deformity. (-) Accessory muscle use.  Symmetric expansion. (-) Tenderness on palpation.  Resonant on percussion.  Diminished BS on both lower lung zones. (-) wheezing, crackles, rhonchi (-) egophony  Cardiovascular: Regular rate and  rhythm, heart sounds normal, no murmur or gallops, no peripheral edema  Gastrointestinal:  Normal bowel sounds. Soft, non-tender. No hepatosplenomegaly.  (-) masses.   Musculoskeletal:  Normal muscle tone. Normal gait.  Extremities: Grossly normal. (-) clubbing, cyanosis.  (-) edema  Skin: (-) rash,lesions seen.   Neurological/Psychiatric : alert, oriented to time, place, person. Normal mood and affect    CBC Recent Labs     03/09/17  0928  03/10/17  0545  WBC  10.6*  10.5  HGB  11.8*  12.0*  HCT  35.9*  36.3*  PLT  266  294    Coag's Recent Labs     03/09/17  0928  INR  1.03     BMET Recent Labs     03/09/17  0928  03/10/17  0545  NA  135  133*  K  4.4  5.0  CL  101  99*  CO2  28  26  BUN  20  23*  CREATININE  1.18  1.37*  GLUCOSE  116*  119*    Electrolytes Recent Labs     03/09/17  0928  03/10/17  0545  CALCIUM  8.4*  8.6*    Sepsis Markers No results for input(s): PROCALCITON, O2SATVEN in the last 72 hours.  Invalid input(s): LACTICACIDVEN  ABG No results for input(s): PHART, PCO2ART, PO2ART in the last 72 hours.  Liver Enzymes Recent Labs     03/09/17  0928  AST  19  ALT  14*  ALKPHOS  88  BILITOT  0.3  ALBUMIN  3.1*    Cardiac Enzymes No results for input(s): TROPONINI, PROBNP in the last 72 hours.  Glucose No results for input(s): GLUCAP in the last 72 hours.  Imaging US Aorta  Result Date: 03/09/2017 CLINICAL DATA:  Assess aortic dissection and mural thrombus. History of chest mass. EXAM: ULTRASOUND OF ABDOMINAL AORTA TECHNIQUE: Ultrasound examination of the abdominal aorta was performed to evaluate for abdominal aortic aneurysm. COMPARISON:  CT angiogram of the chest, abdomen and pelvis Mar 09, 2017 at 0503 hours FINDINGS: Abdominal aortic measurements as follows: Maximum aortic dimension: 2.6 x 2.5 cm (as seen on prior CT). Echogenic calcific atherosclerosis. RIGHT Common iliac: Maximal dimension 1.1 cm. LEFT Common iliac colon maximum dimension 1.1 cm. Normal appearance of the kidneys. Technologist reports generally limited examination due to bowel gas. IMPRESSION: Mildly ectatic infrarenal aorta without aneurysm. Please note, small dissections could be obscured by bowel gas. Electronically Signed   By: Elon Alas M.D.   On: 03/09/2017 22:49       ASSESSMENT / PLAN: Left Hilar mass, 6 x 4 x 6 cm, extending into the left infra hilar region, possibly extending into LLLobe and left posterior pleura. Non occlusive thrombus into the pulmonary artery to the left lower lobe. Possible extension into the  esophagus/descending thoracic aorta. Question small chronic aortic dissection at distal abd aorta vs penetrating aortic ulceration. Diffuse calcifications (coronary). 1.2 cm subcarinal LN + 1.3 cm AP node. 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  We will try to schedule for Wed am. Keep NPO after MN.  - pls get 2Decho.  - pt is at high risk with cx with bronchoscopy (given CT scan findings + AKI + HTN + CAD) but pt and family understand the risks of procedure and accept the risks of procedure and want to proceed  with bronchoscopy.   AKI, likely related to above + HTN - suggest IVF 36 mls/hr - rpt BMET in am  ? Aneurysm, thoracic aorta.  Possible small chronic aortic dissection - may need input from Cards vs Vascular.  Family is very involved.   Cont with heparin sq for DVT prophylaxis.  Plan extensively d/w pt and family.    Monica Becton, MD 03/10/2017, 11:16 AM [facility] Pager [phone] After 3 pm or if no answer, call [phone]

## 2017-03-10 NOTE — Progress Notes (Signed)
PROGRESS NOTE    Harry Hinton  WEX:[id number] DOB: 1932/11/06 DOA: 03/09/2017 PCP: Myrlene Broker, MD   Brief Narrative:  Harry Hinton  is a 83 y.o. male, with no previous medical problems, has not seen a physician in many years, and on no home medications, does smoke one pack of cigarettes daily quit about 2 weeks ago, has had issues with mild chronic cough with 2 episodes of hemoptysis about 2 weeks ago. He basically seek medical attention today after experiencing some lightheadedness this morning associated with weight chest and left-sided scapular/shoulder pain ongoing for 12-14 hours. He went to [facility] ER where his workup showed high blood pressure, after initial blood work which was unremarkable CT scan of chest abdomen pelvis was done showing left hilar mass suspicious for bronchogenic cancer with a tumor thrombus extending into the left pulmonary artery, possible chronic aortic dissection at the distal abdominal aorta with a mural thrombus, enlarged aortopulmonary nodes suspicious for metastatic disease.  [facility] ER physician discussed the case with pulmonary physician on call Dr. Lake Bells who requested the patient to be transferred to [facility] ER under hospitalist service. Patient currently symptom-free and Oncology Dr. Marin Olp has seen the patient. Patient may undergo bronchoscopy with EBUS in AM pending Dr. 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   Assessment & Plan:   Principal Problem:   Cough with hemoptysis Active Problems:   Essential hypertension   Atypical chest pain   Lung mass  Atypical chest pain due to left-sided hilar mass suspicious for primary bronchogenic lung cancer (Small Cell Carcinoma) with tumor invasion into left pulmonary artery and 4 simple aortopulmonary metastatic lymphadenopathy. -Chest Pain is improved slightly, has had recent cough with very little hemoptysis  -Dr. Marin Olp Consulted for evaluation and  saw the patient yesterday -He ordered Cest MRI today which is still pending to be done -Pulmonary Dr. Varney Baas Dios evaluated and appreciate recc's -Recommended to getting an ECHOCardiogram -Will Likely need Bronch and EBUS; scheduled tentatively for 2:45 pm with Dr. Lamonte Sakai at [facility] -Most likely will require palliative radiation treatments but unclear as patient is guarded as tumor has already invaded into pulmonary artery. -Per Oncology and Pulmonary Recc's ok to continue Heparin 5,000 units sq q8h -Continue to Monitor -Dr. Marin Olp to see patient in AM about helping decide about Bronch, per Dr. Varney Baas Dios note if Dr. Marin Olp things the treatment will be pallaitave then patient does not want bronchoscopy and if he thinks treatment is curative then ok to proceed with Bronchoscopy.   Hypertension -Placed on Coreg 3.125 mg po BID and Norvasc 10 mg po Daily -Continue to Monitor  Mild AKI  -Patient's BUN/Cr went from 20/1.18 -> 23/1.37 -Will start on Low Dose IVF with NS at 50 mL/hr -Avoid Nephrotoxic Medications  ?Chronic Aortic Dissection at the Distal Abdominal Aorta with Mural Thrombus -Abdominal Ultrasound showed Mildly ectatic infrarenal aorta without aneurysm. Please note, small dissections could be obscured by bowel gas -Cannot view CT done in [place]  Dizziness. -Likely due to hypertensive urgency upon arrival,  -Blood pressure control as above, completely symptom-free now,  -If reoccurs will do MRI head to rule out any metastatic disease.  Tobacco Abuse -Counseled to quit.  Anemia of chronic disease.  -Anemia panel ordered along with LDH. -Anemia Panel showed Iron Level of 28, UIBC of 218, TIBC 246, Saturation Ratios of 11%, Ferritin of 107, Folate 37.5 and Vitamin B12 of 409 -Hb/Hct went from 11.8/35.9 ->  12.0/36.3 -Repeat CBC in AM  Mild Hyponatremia -Patient's Na+ was 133 -Started on IVF with NS at 50 mL/hr -Repeat CMP in AM  DVT prophylaxis: Heparin 5,000 units  sq q8h  Code Status: FULL CODE Family Communication: Discussed with wife and daughter at bedside Disposition Plan: Pending further workup  Consultants:  Oncology Dr. Sigurd Sos PCCM/Pulmonary Dr. Elza Rafter A de Dios  Procedures:   Possible Bronchoscopy with EBUS at [facility] with Dr. Lamonte Sakai   Antimicrobials: Anti-infectives    None     Subjective: Seen and examined at beside and was improved. Had no complaints this AM. No CP or SOB. Denied any recent hemoptysis and states dizziness resolved.   Objective: Vitals:   03/09/17 2042 03/10/17 0424 03/10/17 1002 03/10/17 1507  BP: (!) 150/75 (!) 160/92 (!) 162/88 128/73  Pulse: 79 75  74  Resp: 17 16  17   Temp: 98.2 F (36.8 C) 98.4 F (36.9 C)  98.9 F (37.2 C)  TempSrc: Oral Oral  Oral  SpO2: 96% (!) 64%  98%  Weight:  82.6 kg (182 lb 1.6 oz)    Height:        Intake/Output Summary (Last 24 hours) at 03/10/17 1715 Last data filed at 03/10/17 1500  Gross per 24 hour  Intake             1060 ml  Output                0 ml  Net             1060 ml   Filed Weights   03/09/17 0900 03/10/17 0424  Weight: 82.8 kg (182 lb 8.7 oz) 82.6 kg (182 lb 1.6 oz)    Examination: Physical Exam:  Constitutional: NAD and appears calm and comfortable Eyes: Llids and conjunctivae normal, sclerae anicteric  ENMT: External Ears, Nose appear normal. Grossly normal hearing. Mucous membranes are moist.  Neck: Appears normal, supple, no cervical masses, normal ROM, no appreciable thyromegaly, no JVD Respiratory: Diminished to auscultation bilaterally, no wheezing, rales, rhonchi or crackles. Normal respiratory effort and patient is not tachypenic. No accessory muscle use.  Cardiovascular: RRR, no murmurs / rubs / gallops. S1 and S2 auscultated. No extremity edema.   Abdomen: Soft, non-tender, non-distended. No masses palpated. No appreciable hepatosplenomegaly. Bowel sounds positive x4.  GU: Deferred. Musculoskeletal: No clubbing /  cyanosis of digits/nails. No joint deformity upper and lower extremities.  Skin: No rashes, lesions, ulcers. No induration; Warm and dry.  Neurologic: CN 2-12 grossly intact with no focal deficits. Romberg sign cerebellar reflexes not assessed.  Psychiatric: Normal judgment and insight. Alert and oriented x 3. Normal mood and appropriate affect.   Data Reviewed: I have personally reviewed following labs and imaging studies  CBC:  Recent Labs Lab 03/09/17 0928 03/10/17 0545  WBC 10.6* 10.5  HGB 11.8* 12.0*  HCT 35.9* 36.3*  MCV 86.1 85.8  PLT 266 400   Basic Metabolic Panel:  Recent Labs Lab 03/09/17 0928 03/10/17 0545  NA 135 133*  K 4.4 5.0  CL 101 99*  CO2 28 26  GLUCOSE 116* 119*  BUN 20 23*  CREATININE 1.18 1.37*  CALCIUM 8.4* 8.6*   GFR: Estimated Creatinine Clearance: 47.8 mL/min (A) (by C-G formula based on SCr of 1.37 mg/dL (H)). Liver Function Tests:  Recent Labs Lab 03/09/17 0928  AST 19  ALT 14*  ALKPHOS 88  BILITOT 0.3  PROT 8.0  ALBUMIN 3.1*   No results for  input(s): LIPASE, AMYLASE in the last 168 hours. No results for input(s): AMMONIA in the last 168 hours. Coagulation Profile:  Recent Labs Lab 03/09/17 0928  INR 1.03   Cardiac Enzymes: No results for input(s): CKTOTAL, CKMB, CKMBINDEX, TROPONINI in the last 168 hours. BNP (last 3 results) No results for input(s): PROBNP in the last 8760 hours. HbA1C: No results for input(s): HGBA1C in the last 72 hours. CBG: No results for input(s): GLUCAP in the last 168 hours. Lipid Profile: No results for input(s): CHOL, HDL, LDLCALC, TRIG, CHOLHDL, LDLDIRECT in the last 72 hours. Thyroid Function Tests:  Recent Labs  03/09/17 0928  TSH 1.295   Anemia Panel:  Recent Labs  03/09/17 0928  VITAMINB12 409  FOLATE 37.5  FERRITIN 107  TIBC 246*  IRON 28*  RETICCTPCT 1.2   Sepsis Labs: No results for input(s): PROCALCITON, LATICACIDVEN in the last 168 hours.  No results found for  this or any previous visit (from the past 240 hour(s)).   Radiology Studies: US Aorta  Result Date: 03/09/2017 CLINICAL DATA:  Assess aortic dissection and mural thrombus. History of chest mass. EXAM: ULTRASOUND OF ABDOMINAL AORTA TECHNIQUE: Ultrasound examination of the abdominal aorta was performed to evaluate for abdominal aortic aneurysm. COMPARISON:  CT angiogram of the chest, abdomen and pelvis Mar 09, 2017 at 0503 hours FINDINGS: Abdominal aortic measurements as follows: Maximum aortic dimension: 2.6 x 2.5 cm (as seen on prior CT). Echogenic calcific atherosclerosis. RIGHT Common iliac: Maximal dimension 1.1 cm. LEFT Common iliac colon maximum dimension 1.1 cm. Normal appearance of the kidneys. Technologist reports generally limited examination due to bowel gas. IMPRESSION: Mildly ectatic infrarenal aorta without aneurysm. Please note, small dissections could be obscured by bowel gas. Electronically Signed   By: Elon Alas M.D.   On: 03/09/2017 22:49   Scheduled Meds: . amLODipine  10 mg Oral Daily  . carvedilol  3.125 mg Oral BID WC  . heparin subcutaneous  5,000 Units Subcutaneous Q8H   Continuous Infusions: . sodium chloride 50 mL/hr at 03/10/17 1151    LOS: 1 day   Kerney Elbe, DO [facility] Pager [phone]  If 7PM-7AM, please contact night-coverage [email or web address] Password [email or web address] 03/10/2017, 5:15 PM

## 2017-03-10 NOTE — Progress Notes (Signed)
  Echocardiogram 2D Echocardiogram has been performed.  Darlina Sicilian M 03/10/2017, 2:27 PM

## 2017-03-11 ENCOUNTER — Inpatient Hospital Stay (HOSPITAL_COMMUNITY): Payer: Medicare Other | Admitting: Anesthesiology

## 2017-03-11 ENCOUNTER — Encounter (HOSPITAL_COMMUNITY)
Admission: AD | Disposition: A | Discharge status: Home / Self Care | Payer: Self-pay | Source: Other Acute Inpatient Hospital | Attending: Internal Medicine

## 2017-03-11 DIAGNOSIS — I7102 Dissection of abdominal aorta: Secondary | ICD-10-CM

## 2017-03-11 HISTORY — PX: VIDEO BRONCHOSCOPY WITH ENDOBRONCHIAL ULTRASOUND: SHX6177

## 2017-03-11 LAB — COMPREHENSIVE METABOLIC PANEL
ALBUMIN: 2.9 g/dL — AB (ref 3.5–5.0)
ALK PHOS: 77 U/L (ref 38–126)
ALT: 12 U/L — AB (ref 17–63)
AST: 18 U/L (ref 15–41)
Anion gap: 6 (ref 5–15)
BUN: 17 mg/dL (ref 6–20)
CALCIUM: 8.7 mg/dL — AB (ref 8.9–10.3)
CO2: 28 mmol/L (ref 22–32)
CREATININE: 1.14 mg/dL (ref 0.61–1.24)
Chloride: 102 mmol/L (ref 101–111)
GFR calc non Af Amer: 58 mL/min — ABNORMAL LOW (ref >60)
GLUCOSE: 118 mg/dL — AB (ref 65–99)
Potassium: 5.2 mmol/L — ABNORMAL HIGH (ref 3.5–5.1)
SODIUM: 136 mmol/L (ref 135–145)
Total Bilirubin: 0.4 mg/dL (ref 0.3–1.2)
Total Protein: 7.8 g/dL (ref 6.5–8.1)

## 2017-03-11 LAB — CBC WITH DIFFERENTIAL/PLATELET
Basophils Absolute: 0.1 10*3/uL (ref 0.0–0.1)
Basophils Relative: 1 %
Eosinophils Absolute: 0.4 10*3/uL (ref 0.0–0.7)
Eosinophils Relative: 5 %
HCT: 35.9 % — ABNORMAL LOW (ref 39.0–52.0)
Hemoglobin: 11.8 g/dL — ABNORMAL LOW (ref 13.0–17.0)
Lymphocytes Relative: 28 %
Lymphs Abs: 2.5 10*3/uL (ref 0.7–4.0)
MCH: 28.4 pg (ref 26.0–34.0)
MCHC: 32.9 g/dL (ref 30.0–36.0)
MCV: 86.5 fL (ref 78.0–100.0)
Monocytes Absolute: 0.7 10*3/uL (ref 0.1–1.0)
Monocytes Relative: 8 %
NEUTROS PCT: 58 %
Neutro Abs: 5.3 10*3/uL (ref 1.7–7.7)
Platelets: 262 10*3/uL (ref 150–400)
RBC: 4.15 MIL/uL — AB (ref 4.22–5.81)
RDW: 14.2 % (ref 11.5–15.5)
WBC: 8.9 10*3/uL (ref 4.0–10.5)

## 2017-03-11 LAB — MRSA PCR SCREENING: MRSA by PCR: NEGATIVE

## 2017-03-11 LAB — PHOSPHORUS: Phosphorus: 3 mg/dL (ref 2.5–4.6)

## 2017-03-11 LAB — MAGNESIUM: Magnesium: 2 mg/dL (ref 1.7–2.4)

## 2017-03-11 SURGERY — BRONCHOSCOPY, WITH EBUS
Anesthesia: General

## 2017-03-11 MED ORDER — ROCURONIUM BROMIDE 10 MG/ML (PF) SYRINGE
PREFILLED_SYRINGE | INTRAVENOUS | Status: AC
  Filled 2017-03-11: qty 5

## 2017-03-11 MED ORDER — FENTANYL CITRATE (PF) 250 MCG/5ML IJ SOLN
INTRAMUSCULAR | Status: DC | PRN
  Administered 2017-03-11: 100 ug via INTRAVENOUS

## 2017-03-11 MED ORDER — PROMETHAZINE HCL 25 MG/ML IJ SOLN: 6.2500 mg | INTRAMUSCULAR | Status: DC | PRN

## 2017-03-11 MED ORDER — SUGAMMADEX SODIUM 200 MG/2ML IV SOLN
INTRAVENOUS | Status: AC
  Filled 2017-03-11: qty 2

## 2017-03-11 MED ORDER — ONDANSETRON HCL 4 MG/2ML IJ SOLN
INTRAMUSCULAR | Status: DC | PRN
  Administered 2017-03-11: 4 mg via INTRAVENOUS

## 2017-03-11 MED ORDER — FENTANYL CITRATE (PF) 250 MCG/5ML IJ SOLN
INTRAMUSCULAR | Status: AC
  Filled 2017-03-11: qty 5

## 2017-03-11 MED ORDER — SUGAMMADEX SODIUM 500 MG/5ML IV SOLN
INTRAVENOUS | Status: DC | PRN
  Administered 2017-03-11: 300 mg via INTRAVENOUS

## 2017-03-11 MED ORDER — PHENYLEPHRINE 40 MCG/ML (10ML) SYRINGE FOR IV PUSH (FOR BLOOD PRESSURE SUPPORT)
PREFILLED_SYRINGE | INTRAVENOUS | Status: AC
  Filled 2017-03-11: qty 10

## 2017-03-11 MED ORDER — ARTIFICIAL TEARS OPHTHALMIC OINT
TOPICAL_OINTMENT | OPHTHALMIC | Status: AC
  Filled 2017-03-11: qty 3.5

## 2017-03-11 MED ORDER — ONDANSETRON HCL 4 MG/2ML IJ SOLN
INTRAMUSCULAR | Status: AC
  Filled 2017-03-11: qty 2

## 2017-03-11 MED ORDER — EPHEDRINE SULFATE 50 MG/ML IJ SOLN
INTRAMUSCULAR | Status: DC | PRN
  Administered 2017-03-11: 10 mg via INTRAVENOUS
  Administered 2017-03-11: 5 mg via INTRAVENOUS

## 2017-03-11 MED ORDER — EPHEDRINE 5 MG/ML INJ
INTRAVENOUS | Status: AC
  Filled 2017-03-11: qty 30

## 2017-03-11 MED ORDER — PHENYLEPHRINE HCL 10 MG/ML IJ SOLN
INTRAVENOUS | Status: DC | PRN
  Administered 2017-03-11: 20 ug/min via INTRAVENOUS

## 2017-03-11 MED ORDER — PROPOFOL 10 MG/ML IV BOLUS
INTRAVENOUS | Status: AC
  Filled 2017-03-11: qty 20

## 2017-03-11 MED ORDER — PROPOFOL 10 MG/ML IV BOLUS
INTRAVENOUS | Status: DC | PRN
  Administered 2017-03-11: 150 mg via INTRAVENOUS

## 2017-03-11 MED ORDER — LIDOCAINE 2% (20 MG/ML) 5 ML SYRINGE
INTRAMUSCULAR | Status: AC
  Filled 2017-03-11: qty 5

## 2017-03-11 MED ORDER — GLYCOPYRROLATE 0.2 MG/ML IJ SOLN
INTRAMUSCULAR | Status: DC | PRN
  Administered 2017-03-11: .2 mg via INTRAVENOUS

## 2017-03-11 MED ORDER — FENTANYL CITRATE (PF) 100 MCG/2ML IJ SOLN: 25.0000 ug | INTRAMUSCULAR | Status: DC | PRN

## 2017-03-11 MED ORDER — LIDOCAINE HCL (CARDIAC) 20 MG/ML IV SOLN
INTRAVENOUS | Status: DC | PRN
  Administered 2017-03-11: 5 mL via INTRATRACHEAL

## 2017-03-11 MED ORDER — ROCURONIUM BROMIDE 100 MG/10ML IV SOLN
INTRAVENOUS | Status: DC | PRN
  Administered 2017-03-11: 40 mg via INTRAVENOUS
  Administered 2017-03-11: 10 mg via INTRAVENOUS

## 2017-03-11 MED ORDER — 0.9 % SODIUM CHLORIDE (POUR BTL) OPTIME
TOPICAL | Status: DC | PRN
  Administered 2017-03-11: 1000 mL

## 2017-03-11 MED ORDER — LACTATED RINGERS IV SOLN
INTRAVENOUS | Status: DC
  Administered 2017-03-11 (×2): via INTRAVENOUS

## 2017-03-11 MED ORDER — PHENYLEPHRINE HCL 10 MG/ML IJ SOLN
INTRAMUSCULAR | Status: DC | PRN
  Administered 2017-03-11: 120 ug via INTRAVENOUS

## 2017-03-11 SURGICAL SUPPLY — 29 items
BRUSH CYTOL CELLEBRITY 1.5X140 (MISCELLANEOUS) ×4 IMPLANT
CANISTER SUCT 3000ML PPV (MISCELLANEOUS) ×4 IMPLANT
CONT SPEC 4OZ CLIKSEAL STRL BL (MISCELLANEOUS) ×8 IMPLANT
COVER BACK TABLE 60X90IN (DRAPES) ×4 IMPLANT
COVER DOME SNAP 22 D (MISCELLANEOUS) ×4 IMPLANT
FORCEPS BIOP RJ4 1.8 (CUTTING FORCEPS) IMPLANT
GAUZE SPONGE 4X4 12PLY STRL (GAUZE/BANDAGES/DRESSINGS) ×4 IMPLANT
GLOVE BIO SURGEON STRL SZ7.5 (GLOVE) ×4 IMPLANT
GOWN STRL REUS W/ TWL LRG LVL3 (GOWN DISPOSABLE) ×2 IMPLANT
GOWN STRL REUS W/TWL LRG LVL3 (GOWN DISPOSABLE) ×2
KIT CLEAN ENDO COMPLIANCE (KITS) ×8 IMPLANT
KIT ROOM TURNOVER OR (KITS) ×4 IMPLANT
MARKER SKIN DUAL TIP RULER LAB (MISCELLANEOUS) ×4 IMPLANT
NEEDLE EBUS SONO TIP PENTAX (NEEDLE) ×4 IMPLANT
NEEDLE ECHOTIP HI DEF 22GA (NEEDLE) ×4 IMPLANT
NS IRRIG 1000ML POUR BTL (IV SOLUTION) ×4 IMPLANT
OIL SILICONE PENTAX (PARTS (SERVICE/REPAIRS)) ×4 IMPLANT
PAD ARMBOARD 7.5X6 YLW CONV (MISCELLANEOUS) ×8 IMPLANT
SYR 20CC LL (SYRINGE) ×8 IMPLANT
SYR 20ML ECCENTRIC (SYRINGE) ×8 IMPLANT
SYR 50ML LL SCALE MARK (SYRINGE) IMPLANT
SYR 50ML SLIP (SYRINGE) IMPLANT
SYR 5ML LUER SLIP (SYRINGE) ×4 IMPLANT
TOWEL OR 17X24 6PK STRL BLUE (TOWEL DISPOSABLE) ×4 IMPLANT
TRAP SPECIMEN MUCOUS 40CC (MISCELLANEOUS) IMPLANT
TUBE CONNECTING 20'X1/4 (TUBING) ×1
TUBE CONNECTING 20X1/4 (TUBING) ×3 IMPLANT
UNDERPAD 30X30 (UNDERPADS AND DIAPERS) ×4 IMPLANT
WATER STERILE IRR 1000ML POUR (IV SOLUTION) ×4 IMPLANT

## 2017-03-11 NOTE — Op Note (Signed)
Video Bronchoscopy with Endobronchial Ultrasound Procedure Note  Date of Operation: 03/11/2017  Pre-op Diagnosis: L hilar mass, mediastinal LAD  Post-op Diagnosis: same  Surgeon: Baltazar Apo  Assistants: none  Anesthesia: General endotracheal anesthesia  Operation: Flexible video fiberoptic bronchoscopy with endobronchial ultrasound and biopsies.  Estimated Blood Loss: Minimal  Complications: none apparent   Indications and History: Harry Hinton is a 83 y.o. male with tobacco. He experienced some hemoptysis possibly one month ago. He was then evaluated for lightheadedness. Chest x-ray and then CT scan identified a left hilar mass with associated mediastinal lymphadenopathy. Recommendation was made to perform tissue biopsies via endobronchial ultrasound. The risks, benefits, complications, treatment options and expected outcomes were discussed with the patient.  The possibilities of pneumothorax, pneumonia, reaction to medication, pulmonary aspiration, perforation of a viscus, bleeding, failure to diagnose a condition and creating a complication requiring transfusion or operation were discussed with the patient who freely signed the consent.    Description of Procedure: The patient was examined in the preoperative area and history and data from the preprocedure consultation were reviewed. It was deemed appropriate to proceed.  The patient was taken to OR 10, identified as Harry Hinton and the procedure verified as Flexible Video Fiberoptic Bronchoscopy.  A Time Out was held and the above information confirmed. After being taken to the operating room general anesthesia was initiated and the patient  was orally intubated. The video fiberoptic bronchoscope was introduced via the endotracheal tube and a general inspection was performed which showed scant secretions. All airways were normal with the exception of some narrowing of the left lower lobe airway. The mucosal wall of the left lower lobe  airway was also somewhat irregular. Endobronchial brushings and endobronchial biopsies were performed in the left lower lobe airway for cytology and pathology.. The standard scope was then withdrawn and the endobronchial ultrasound was used to identify and characterize the peritracheal, hilar and bronchial lymph nodes. Inspection showed enlargement of 4L, 7, 10 L nodes. I was also able to identify a left hilar mass in the left lower lobe airway using ultrasound. Using real-time ultrasound guidance Wang needle biopsies were take from Station 4L, 7, nodes and were sent for cytology. Needle biopsies were then performed on the left hilar mass by extending the scope into the left lower lobe airway. The patient tolerated the procedure well without apparent complications. There was no significant blood loss. The bronchoscope was withdrawn. Anesthesia was reversed and the patient was taken to the PACU for recovery.   Samples: 1. Wang needle biopsies from 4L node 2. Wang needle biopsies from 7 node 3. Wang needle biopsies from L hilar mass 4. Endobronchial biopsies from the left lower lobe airway 5. Endobronchial brushings from the left lower lobe airway  Plans:  The patient will be transferred from the PACU to his hospital room when recovered from anesthesia. We will review the cytology, pathology results with the patient when they become available. Outpatient followup will be with Dr Lamonte Sakai or Dr deDios.   Baltazar Apo, MD, PhD 03/11/2017, 4:41 PM  Pulmonary and Critical Care [phone] or if no answer [phone]

## 2017-03-11 NOTE — Progress Notes (Signed)
Pt arrived to 2w16 from [facility]. VSS. Pt oriented to room. Call bell and phone within reach. Family at bedside. Will continue to monitor.

## 2017-03-11 NOTE — Progress Notes (Signed)
PULMONARY / CRITICAL CARE MEDICINE   Name: Harry Hinton MRN: [id number] DOB: [birth year]    ADMISSION DATE:  03/09/2017 CONSULTATION DATE:  03/09/17  REFERRING MD:  [facility]ad  CHIEF COMPLAINT:  Lung mass  HISTORY OF PRESENT ILLNESS:   66 yowm with remote smoking hx and atypical chest and abd pains fleeting in nature x one month PTA with spell of light headedness to [employer] with w/u there Pos L hilar mass with L PA involvement.  He has had sev episodes over the last month of traces of blood when he clears his throat assoc with mild epistaxis but no frank hemoptysis/ mucus plugs or excess/ purulent sputum and quite active and still playing golf one week pta.   No obvious day to day or daytime variability or assoc sob or classically pleuritc or ex  cp or chest tightness, subjective wheeze or overt sinus or hb symptoms. No unusual exp hx or h/o childhood pna/ asthma or knowledge of premature birth.  Sleeping ok without nocturnal  or early am exacerbation  of respiratory  c/o's or need for noct saba. Also denies any obvious fluctuation of symptoms with weather or environmental changes or other aggravating or alleviating factors except as outlined above           SUBJECTIVE:  BP better controlled.  No isues overnight.  (-) cp, SOB   Vitals:  Vitals:   03/10/17 1002 03/10/17 1507 03/10/17 2059 03/11/17 0500  BP: (!) 162/88 128/73 127/65   Pulse:  74 73   Resp:  17 18   Temp:  98.9 F (37.2 C) 98.3 F (36.8 C)   TempSrc:  Oral Oral   SpO2:  98% 96%   Weight:    83.7 kg (184 lb 8.4 oz)  Height:        Constitutional/General:  Pleasant, well-nourished, well-developed, not in any distress,  Comfortably seating.  Well kempt  Body mass index is 24.35 kg/m. Wt Readings from Last 3 Encounters:  03/11/17 83.7 kg (184 lb 8.4 oz)     HEENT:PERLA. Anicteric sclerae. Normal nasal mucosa.   No oral  lesions,  mouth clear,  oropharynx clear, no postnasal drip. (-) Oral thrush. No  dental caries.  Airway - Mallampati class III  Neck: No masses. Midline trachea. No JVD, (-) LAD. (-) bruits appreciated.  Respiratory/Chest: Grossly normal chest. (-) deformity. (-) Accessory muscle use.  Symmetric expansion. (-) Tenderness on palpation.  Resonant on percussion.  Diminished BS on both lower lung zones. (-) wheezing, crackles, rhonchi (-) egophony  Cardiovascular: Regular rate and  rhythm, heart sounds normal, no murmur or gallops, no peripheral edema  Gastrointestinal:  Normal bowel sounds. Soft, non-tender. No hepatosplenomegaly.  (-) masses.   Musculoskeletal:  Normal muscle tone. Normal gait.   Extremities: Grossly normal. (-) clubbing, cyanosis.  (-) edema  Skin: (-) rash,lesions seen.   Neurological/Psychiatric : alert, oriented to time, place, person. Normal mood and affect    CBC Recent Labs     03/09/17  0928  03/10/17  0545  03/11/17  0607  WBC  10.6*  10.5  8.9  HGB  11.8*  12.0*  11.8*  HCT  35.9*  36.3*  35.9*  PLT  266  294  262    Coag's Recent Labs     03/09/17  0928  INR  1.03    BMET Recent Labs     03/09/17  0928  03/10/17  0545  03/11/17  0607  NA  135  133*  136  K  4.4  5.0  5.2*  CL  101  99*  102  CO2  28  26  28   BUN  20  23*  17  CREATININE  1.18  1.37*  1.14  GLUCOSE  116*  119*  118*    Electrolytes Recent Labs     03/09/17  0928  03/10/17  0545  03/11/17  0607  CALCIUM  8.4*  8.6*  8.7*  MG   --    --   2.0  PHOS   --    --   3.0    Sepsis Markers No results for input(s): PROCALCITON, O2SATVEN in the last 72 hours.  Invalid input(s): LACTICACIDVEN  ABG No results for input(s): PHART, PCO2ART, PO2ART in the last 72 hours.  Liver Enzymes Recent Labs     03/09/17  0928  03/11/17  0607  AST  19  18  ALT  14*  12*  ALKPHOS  88  77  BILITOT  0.3  0.4  ALBUMIN  3.1*  2.9*    Cardiac Enzymes No results for input(s): TROPONINI, PROBNP in the last 72 hours.  Glucose No results for  input(s): GLUCAP in the last 72 hours.  Imaging US Aorta  Result Date: 03/09/2017 CLINICAL DATA:  Assess aortic dissection and mural thrombus. History of chest mass. EXAM: ULTRASOUND OF ABDOMINAL AORTA TECHNIQUE: Ultrasound examination of the abdominal aorta was performed to evaluate for abdominal aortic aneurysm. COMPARISON:  CT angiogram of the chest, abdomen and pelvis Mar 09, 2017 at 0503 hours FINDINGS: Abdominal aortic measurements as follows: Maximum aortic dimension: 2.6 x 2.5 cm (as seen on prior CT). Echogenic calcific atherosclerosis. RIGHT Common iliac: Maximal dimension 1.1 cm. LEFT Common iliac colon maximum dimension 1.1 cm. Normal appearance of the kidneys. Technologist reports generally limited examination due to bowel gas. IMPRESSION: Mildly ectatic infrarenal aorta without aneurysm. Please note, small dissections could be obscured by bowel gas. Electronically Signed   By: Elon Alas M.D.   On: 03/09/2017 22:49       ASSESSMENT / PLAN: Left Hilar mass, 6 x 4 x 6 cm, extending into the left infra hilar region, possibly extending into LLLobe and left posterior pleura. Non occlusive thrombus into the pulmonary artery to the left lower lobe. Possible extension into the esophagus/descending thoracic aorta. Question small chronic aortic dissection at distal abd aorta vs penetrating aortic ulceration. Diffuse calcifications (coronary). 1.2 cm subcarinal LN + 1.3 cm AP node. 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  He is at increased risk for Cx given his CAD (based on ct scan; echo looks good) + possibility of chronic aortic dissection + recent AKI.  He has good functional capacity however. They discussed the case with Dr. Martha Clan as well this am.  Pt and family have decided to pursue bronchoscopy + EBUS.  Plan to do it today at [facility] c/o Dr. Baltazar Apo at 2:45pm.  - keep NPO - plan  d/w Dr. Dyann Kief. He will facilitate transfer to [facility].    AKI, likely related to above + HTN. Improved - cont  IVF 50 mls/hr - as he is NPO, suggest lopressor IV prn   ? Aneurysm, thoracic aorta.  Possible small chronic aortic dissection - may need input from Cards vs Vascular.  Family is very involved. Will await results of bronchoscopy.    Cont with heparin  sq for DVT prophylaxis.  Plan extensively d/w pt and family.    Monica Becton, MD 03/11/2017, 8:29 AM [place] [facility] Pager [phone] After 3 pm or if no answer, call [phone]

## 2017-03-11 NOTE — H&P (View-Only) (Signed)
PULMONARY / CRITICAL CARE MEDICINE   Name: Harry Hinton MRN: [id number] DOB: 07/25/1933    ADMISSION DATE:  03/09/2017 CONSULTATION DATE:  03/09/17  REFERRING MD:  [facility]  CHIEF COMPLAINT:  Lung mass  HISTORY OF PRESENT ILLNESS:   76 yowm with remote smoking hx and atypical chest and abd pains fleeting in nature x one month PTA with spell of light headedness to [employer] with w/u there Pos L hilar mass with L PA involvement.  He has had sev episodes over the last month of traces of blood when he clears his throat assoc with mild epistaxis but no frank hemoptysis/ mucus plugs or excess/ purulent sputum and quite active and still playing golf one week pta.   No obvious day to day or daytime variability or assoc sob or classically pleuritc or ex  cp or chest tightness, subjective wheeze or overt sinus or hb symptoms. No unusual exp hx or h/o childhood pna/ asthma or knowledge of premature birth.  Sleeping ok without nocturnal  or early am exacerbation  of respiratory  c/o's or need for noct saba. Also denies any obvious fluctuation of symptoms with weather or environmental changes or other aggravating or alleviating factors except as outlined above           SUBJECTIVE:  BP better controlled.  No isues overnight.  (-) cp, SOB   Vitals:  Vitals:   03/10/17 1002 03/10/17 1507 03/10/17 2059 03/11/17 0500  BP: (!) 162/88 128/73 127/65   Pulse:  74 73   Resp:  17 18   Temp:  98.9 F (37.2 C) 98.3 F (36.8 C)   TempSrc:  Oral Oral   SpO2:  98% 96%   Weight:    83.7 kg (184 lb 8.4 oz)  Height:        Constitutional/General:  Pleasant, well-nourished, well-developed, not in any distress,  Comfortably seating.  Well kempt  Body mass index is 24.35 kg/m. Wt Readings from Last 3 Encounters:  03/11/17 83.7 kg (184 lb 8.4 oz)     HEENT:PERLA. Anicteric sclerae. Normal nasal mucosa.   No oral  lesions,  mouth clear,  oropharynx clear, no postnasal drip. (-) Oral thrush. No  dental caries.  Airway - Mallampati class III  Neck: No masses. Midline trachea. No JVD, (-) LAD. (-) bruits appreciated.  Respiratory/Chest: Grossly normal chest. (-) deformity. (-) Accessory muscle use.  Symmetric expansion. (-) Tenderness on palpation.  Resonant on percussion.  Diminished BS on both lower lung zones. (-) wheezing, crackles, rhonchi (-) egophony  Cardiovascular: Regular rate and  rhythm, heart sounds normal, no murmur or gallops, no peripheral edema  Gastrointestinal:  Normal bowel sounds. Soft, non-tender. No hepatosplenomegaly.  (-) masses.   Musculoskeletal:  Normal muscle tone. Normal gait.   Extremities: Grossly normal. (-) clubbing, cyanosis.  (-) edema  Skin: (-) rash,lesions seen.   Neurological/Psychiatric : alert, oriented to time, place, person. Normal mood and affect    CBC Recent Labs     03/09/17  0928  03/10/17  0545  03/11/17  0607  WBC  10.6*  10.5  8.9  HGB  11.8*  12.0*  11.8*  HCT  35.9*  36.3*  35.9*  PLT  266  294  262    Coag's Recent Labs     03/09/17  0928  INR  1.03    BMET Recent Labs     03/09/17  0928  03/10/17  0545  03/11/17  0607  NA  135  133*  136  K  4.4  5.0  5.2*  CL  101  99*  102  CO2  28  26  28   BUN  20  23*  17  CREATININE  1.18  1.37*  1.14  GLUCOSE  116*  119*  118*    Electrolytes Recent Labs     03/09/17  0928  03/10/17  0545  03/11/17  0607  CALCIUM  8.4*  8.6*  8.7*  MG   --    --   2.0  PHOS   --    --   3.0    Sepsis Markers No results for input(s): PROCALCITON, O2SATVEN in the last 72 hours.  Invalid input(s): LACTICACIDVEN  ABG No results for input(s): PHART, PCO2ART, PO2ART in the last 72 hours.  Liver Enzymes Recent Labs     03/09/17  0928  03/11/17  0607  AST  19  18  ALT  14*  12*  ALKPHOS  88  77  BILITOT  0.3  0.4  ALBUMIN  3.1*  2.9*    Cardiac Enzymes No results for input(s): TROPONINI, PROBNP in the last 72 hours.  Glucose No results for  input(s): GLUCAP in the last 72 hours.  Imaging US Aorta  Result Date: 03/09/2017 CLINICAL DATA:  Assess aortic dissection and mural thrombus. History of chest mass. EXAM: ULTRASOUND OF ABDOMINAL AORTA TECHNIQUE: Ultrasound examination of the abdominal aorta was performed to evaluate for abdominal aortic aneurysm. COMPARISON:  CT angiogram of the chest, abdomen and pelvis Mar 09, 2017 at 0503 hours FINDINGS: Abdominal aortic measurements as follows: Maximum aortic dimension: 2.6 x 2.5 cm (as seen on prior CT). Echogenic calcific atherosclerosis. RIGHT Common iliac: Maximal dimension 1.1 cm. LEFT Common iliac colon maximum dimension 1.1 cm. Normal appearance of the kidneys. Technologist reports generally limited examination due to bowel gas. IMPRESSION: Mildly ectatic infrarenal aorta without aneurysm. Please note, small dissections could be obscured by bowel gas. Electronically Signed   By: Elon Alas M.D.   On: 03/09/2017 22:49       ASSESSMENT / PLAN: Left Hilar mass, 6 x 4 x 6 cm, extending into the left infra hilar region, possibly extending into LLLobe and left posterior pleura. Non occlusive thrombus into the pulmonary artery to the left lower lobe. Possible extension into the esophagus/descending thoracic aorta. Question small chronic aortic dissection at distal abd aorta vs penetrating aortic ulceration. Diffuse calcifications (coronary). 1.2 cm subcarinal LN + 1.3 cm AP node. 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  He is at increased risk for Cx given his CAD (based on ct scan; echo looks good) + possibility of chronic aortic dissection + recent AKI.  He has good functional capacity however. They discussed the case with Dr. Martha Clan as well this am.  Pt and family have decided to pursue bronchoscopy + EBUS.  Plan to do it today at [facility] c/o Dr. Baltazar Apo at 2:45pm.  - keep NPO - plan  d/w Dr. Dyann Kief. He will facilitate transfer to [facility].    AKI, likely related to above + HTN. Improved - cont  IVF 50 mls/hr - as he is NPO, suggest lopressor IV prn   ? Aneurysm, thoracic aorta.  Possible small chronic aortic dissection - may need input from Cards vs Vascular.  Family is very involved. Will await results of bronchoscopy.    Cont with heparin  sq for DVT prophylaxis.  Plan extensively d/w pt and family.    Monica Becton, MD 03/11/2017, 8:29 AM [place] [facility] Critical [facility] Pager [phone] After 3 pm or if no answer, call [phone]

## 2017-03-11 NOTE — Anesthesia Procedure Notes (Signed)
Procedure Name: Intubation Date/Time: 03/11/2017 3:19 PM Performed by: Mariea Clonts Pre-anesthesia Checklist: Patient identified, Emergency Drugs available, Suction available and Patient being monitored Patient Re-evaluated:Patient Re-evaluated prior to inductionOxygen Delivery Method: Circle System Utilized Preoxygenation: Pre-oxygenation with 100% oxygen Intubation Type: IV induction Ventilation: Mask ventilation without difficulty Laryngoscope Size: Miller and 3 Grade View: Grade I Tube type: Oral Tube size: 8.5 mm Number of attempts: 1 Airway Equipment and Method: Stylet and Oral airway Placement Confirmation: ETT inserted through vocal cords under direct vision,  positive ETCO2 and breath sounds checked- equal and bilateral Tube secured with: Tape Dental Injury: Teeth and Oropharynx as per pre-operative assessment

## 2017-03-11 NOTE — Transfer of Care (Signed)
Immediate Anesthesia Transfer of Care Note  Patient: Harry Hinton  Procedure(s) Performed: Procedure(s): VIDEO BRONCHOSCOPY WITH ENDOBRONCHIAL ULTRASOUND  Patient Location: PACU  Anesthesia Type:General  Level of Consciousness: awake, alert  and oriented  Airway & Oxygen Therapy: Patient Spontanous Breathing and Patient connected to nasal cannula oxygen  Post-op Assessment: Report given to RN and Post -op Vital signs reviewed and stable  Post vital signs: Reviewed and stable  Last Vitals:  Vitals:   03/11/17 1152 03/11/17 1307  BP: (!) 143/65 (!) 165/74  Pulse: 73   Resp: 19 18  Temp: 37.1 C 36.8 C    Last Pain:  Vitals:   03/11/17 1307  TempSrc: Oral  PainSc:          Complications: No apparent anesthesia complications

## 2017-03-11 NOTE — Progress Notes (Signed)
PROGRESS NOTE    Harry Hinton  YKD:[id number] DOB: 1932/10/22 DOA: 03/09/2017 PCP: Myrlene Broker, MD   Brief Narrative:  Harry Hinton  is a 83 y.o. male, with no previous medical problems, has not seen a physician in many years, and on no home medications, does smoke one pack of cigarettes daily quit about 2 weeks ago, has had issues with mild chronic cough with 2 episodes of hemoptysis about 2 weeks ago. He basically seek medical attention today after experiencing some lightheadedness this morning associated with weight chest and left-sided scapular/shoulder pain ongoing for 12-14 hours. He went to [facility] ER where his workup showed high blood pressure, after initial blood work which was unremarkable CT scan of chest abdomen pelvis was done showing left hilar mass suspicious for bronchogenic cancer with a tumor thrombus extending into the left pulmonary artery, possible chronic aortic dissection at the distal abdominal aorta with a mural thrombus, enlarged aortopulmonary nodes suspicious for metastatic disease.  [facility] ER physician discussed the case with pulmonary physician on call Dr. Lake Bells who requested the patient to be transferred to [facility] ER under hospitalist service. Patient currently symptom-free and Oncology Dr. Marin Olp has seen the patient. Patient may undergo bronchoscopy with EBUS in AM pending Dr. 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 Transferred to [facility] for bronchoscopy and biopsy; will follow pulmonary service and oncology service..  Assessment & Plan:   Principal Problem:   Cough with hemoptysis Active Problems:   Essential hypertension   Atypical chest pain   Lung mass   AKI (acute kidney injury) (HCC)   Tobacco abuse   Dizziness   Hyponatremia   Abdominal aortic aneurysm dissection (HCC)  Atypical chest pain due to left-sided hilar mass suspicious for primary bronchogenic lung cancer (Small Cell Carcinoma) with  tumor invasion into left pulmonary artery and 4 simple aortopulmonary metastatic lymphadenopathy. -no further CP and essentially resolution of hemoptysis  -patient seen by Dr. Marin Olp and is looking to have bronchoscopy and biopsy done -per PCCM will transfer to [facility] for bronchoscopy and biopsy -if stable overnight, patient can be discharge in am and follow up with Dr. Marin Olp for further staging and base on biopsy results determine treatment option. -good O2 sat on RA and no using accessory muscles -as an outpatient will need PET scana nd probably head MRI. To be done/scheduled by oncology service as an outpatient .  Hypertension -improved and well controlled now -will continue coreg and norvasc  Mild AKI  -Resolved -most likely associated with dehydration  -will monitor renal function trend   ?Chronic Aortic Dissection at the Distal Abdominal Aorta with Mural Thrombus -abd [place] w/o aneurysm -will need outpatient follow up.  Dizziness. -resolved now -probably associated with dehydration, low sodium and elevated BP on arrival -BP controlled, IVF's given -no orthostatic -will monitor VS and symptomatology   Tobacco Abuse -cessation counseling provided   Anemia of chronic disease.  -Due to anemia of chronic disease, ferritin 107 -Hgb stable and w/o signs of bleeding -will monitor Hgb intermittently -no transfusion needed   Mild Hyponatremia -improved and stable -feel to be secondary to underlying lung malignancy  -will monitor trend   DVT prophylaxis: Heparin 5,000 units sq q8h  Code Status: FULL CODE Family Communication: Discussed with wife and daughter at bedside Disposition Plan: Patient will be transfer to [facility] for bronchoscopy and EBUS biopsy; will follow further recommendations from pulmonary and oncology service. Most likely discharge tomorrow if stable.  Consultants:  Oncology Dr. Sigurd Sos PCCM/Pulmonary Dr. Elza Rafter A de Dios  Procedures:    Planned bronchoscopy and biopsy with Dr. Lamonte Sakai later today    Antimicrobials: Anti-infectives    None     Subjective: Patient seen and examined. No acute complaints. Reports very little if any hemoptysis, no CP, no SOB.  Objective: Vitals:   03/11/17 0500 03/11/17 0730 03/11/17 1152 03/11/17 1307  BP:  125/60 (!) 143/65 (!) 165/74  Pulse:   73   Resp:   19 18  Temp:   98.7 F (37.1 C) 98.3 F (36.8 C)  TempSrc:   Oral Oral  SpO2:   96% 97%  Weight: 83.7 kg (184 lb 8.4 oz)     Height:        Intake/Output Summary (Last 24 hours) at 03/11/17 1428 Last data filed at 03/11/17 0200  Gross per 24 hour  Intake           1367.5 ml  Output                0 ml  Net           1367.5 ml   Filed Weights   03/09/17 0900 03/10/17 0424 03/11/17 0500  Weight: 82.8 kg (182 lb 8.7 oz) 82.6 kg (182 lb 1.6 oz) 83.7 kg (184 lb 8.4 oz)    Examination:  Constitutional: afebrile, in no distress. Reports very little sputum production and in fact denies further hemoptysis. Neck: Supple, no JVD, no thyromegaly, no masses  Respiratory: no wheezing, no crackles, decrease Bs at bases. normal resp effort and not using accessory muscles.  Cardiovascular: RRR, no rubs, no gallops, no murmurs. Abdomen: soft, NT, ND, positive BS, no edema, no cyanosis, no clubbing Musculoskeletal: FROM, no edema, no cyanosis  Skin: no open wounds, no rashes, no petechiae  Neurologic: no focal deficits, CN intact. Psychiatric: good insight, AAOX3, mood stable and appropriate.  Data Reviewed: I have personally reviewed following labs and imaging studies  CBC:  Recent Labs Lab 03/09/17 0928 03/10/17 0545 03/11/17 0607  WBC 10.6* 10.5 8.9  NEUTROABS  --   --  5.3  HGB 11.8* 12.0* 11.8*  HCT 35.9* 36.3* 35.9*  MCV 86.1 85.8 86.5  PLT 266 294 629   Basic Metabolic Panel:  Recent Labs Lab 03/09/17 0928 03/10/17 0545 03/11/17 0607  NA 135 133* 136  K 4.4 5.0 5.2*  CL 101 99* 102  CO2 28 26 28    GLUCOSE 116* 119* 118*  BUN 20 23* 17  CREATININE 1.18 1.37* 1.14  CALCIUM 8.4* 8.6* 8.7*  MG  --   --  2.0  PHOS  --   --  3.0   GFR: Estimated Creatinine Clearance: 57.4 mL/min (by C-G formula based on SCr of 1.14 mg/dL).   Liver Function Tests:  Recent Labs Lab 03/09/17 0928 03/11/17 0607  AST 19 18  ALT 14* 12*  ALKPHOS 88 77  BILITOT 0.3 0.4  PROT 8.0 7.8  ALBUMIN 3.1* 2.9*   Coagulation Profile:  Recent Labs Lab 03/09/17 0928  INR 1.03   Thyroid Function Tests:  Recent Labs  03/09/17 0928  TSH 1.295   Anemia Panel:  Recent Labs  03/09/17 0928  VITAMINB12 409  FOLATE 37.5  FERRITIN 107  TIBC 246*  IRON 28*  RETICCTPCT 1.2    Radiology Studies: US Aorta  Result Date: 03/09/2017 CLINICAL DATA:  Assess aortic dissection and mural thrombus. History of chest mass. EXAM: ULTRASOUND OF ABDOMINAL  AORTA TECHNIQUE: Ultrasound examination of the abdominal aorta was performed to evaluate for abdominal aortic aneurysm. COMPARISON:  CT angiogram of the chest, abdomen and pelvis Mar 09, 2017 at 0503 hours FINDINGS: Abdominal aortic measurements as follows: Maximum aortic dimension: 2.6 x 2.5 cm (as seen on prior CT). Echogenic calcific atherosclerosis. RIGHT Common iliac: Maximal dimension 1.1 cm. LEFT Common iliac colon maximum dimension 1.1 cm. Normal appearance of the kidneys. Technologist reports generally limited examination due to bowel gas. IMPRESSION: Mildly ectatic infrarenal aorta without aneurysm. Please note, small dissections could be obscured by bowel gas. Electronically Signed   By: Elon Alas M.D.   On: 03/09/2017 22:49   Scheduled Meds: . [MAR Hold] amLODipine  10 mg Oral Daily  . [MAR Hold] carvedilol  3.125 mg Oral BID WC   Continuous Infusions: . sodium chloride 50 mL/hr at 03/11/17 0616    LOS: 2 days   Barton Dubois, MD [facility] Pager [phone]  If 7PM-7AM, please contact night-coverage [email or web address] Password  [email or web address] 03/11/2017, 2:28 PM

## 2017-03-11 NOTE — Interval H&P Note (Signed)
PCCM Interval Note  I have seen and evaluated the patient. Have reviewed the risks and benefits of the procedure. He agrees to proceed. No barrier identified  Baltazar Apo, MD, PhD 03/11/2017, 2:41 PM [facility] [phone] or if no answer [phone]

## 2017-03-11 NOTE — Care Management Note (Signed)
Case Management Note  Patient Details  Name: Anthonny Schiller MRN: [id number] Date of Birth: 1933-06-18  Subjective/Objective: 83 y/o m admitted w/cough, hemoptysis, lung mass. From home. Pulmonology following. Transfer to [facility] for bronchoscopy. No CM needs.                  Action/Plan:Transfer to [facility]   Expected Discharge Date:   (unknown)               Expected Discharge Plan:  Acute to Acute Transfer  In-House Referral:     Discharge planning Services  CM Consult  Post Acute Care Choice:    Choice offered to:     DME Arranged:    DME Agency:     HH Arranged:    HH Agency:     Status of Service:  In process, will continue to follow  If discussed at Long Length of Stay Meetings, dates discussed:    Additional Comments:  Dessa Phi, RN 03/11/2017, 11:45 AM

## 2017-03-11 NOTE — Anesthesia Procedure Notes (Signed)
Arterial Line Insertion Start/End5/30/2018 2:57 PM, 03/11/2017 3:03 PM Performed by: Suzette Battiest, anesthesiologist  Patient location: Pre-op. Preanesthetic checklist: patient identified, IV checked, site marked, risks and benefits discussed, surgical consent, monitors and equipment checked, pre-op evaluation, timeout performed and anesthesia consent Lidocaine 1% used for infiltration radial was placed Catheter size: 20 Fr Hand hygiene performed  and maximum sterile barriers used   Attempts: 1 Procedure performed without using ultrasound guided technique. Following insertion, dressing applied. Post procedure assessment: normal and unchanged

## 2017-03-11 NOTE — Anesthesia Preprocedure Evaluation (Addendum)
Anesthesia Evaluation  Patient identified by MRN, date of birth, ID band Patient awake    Reviewed: Allergy & Precautions, NPO status , Patient's Chart, lab work & pertinent test results  Airway Mallampati: II  TM Distance: >3 FB Neck ROM: Full    Dental  (+) Dental Advisory Given   Pulmonary former smoker,  Lung mass with extension into PA   breath sounds clear to auscultation       Cardiovascular hypertension, Pt. on medications + Peripheral Vascular Disease (?Chronic descending aortic dissection)   Rhythm:Regular Rate:Normal  LVEF 55-60%, mild LVH, normal wall motion, grade 1 DD,   indeterminate LV filling pressure, aortic valve sclerosis,   trivial MR, normal LA size, normal IVC.   Neuro/Psych negative neurological ROS     GI/Hepatic negative GI ROS, Neg liver ROS,   Endo/Other  negative endocrine ROS  Renal/GU Renal disease     Musculoskeletal   Abdominal   Peds  Hematology negative hematology ROS (+)   Anesthesia Other Findings   Reproductive/Obstetrics                            Lab Results  Component Value Date   WBC 8.9 03/11/2017   HGB 11.8 (L) 03/11/2017   HCT 35.9 (L) 03/11/2017   MCV 86.5 03/11/2017   PLT 262 03/11/2017   Lab Results  Component Value Date   CREATININE 1.14 03/11/2017   BUN 17 03/11/2017   NA 136 03/11/2017   K 5.2 (H) 03/11/2017   CL 102 03/11/2017   CO2 28 03/11/2017    Anesthesia Physical Anesthesia Plan  ASA: IV  Anesthesia Plan: General   Post-op Pain Management:    Induction: Intravenous  Airway Management Planned: Oral ETT  Additional Equipment: Arterial line  Intra-op Plan:   Post-operative Plan: Extubation in OR  Informed Consent: I have reviewed the patients History and Physical, chart, labs and discussed the procedure including the risks, benefits and alternatives for the proposed anesthesia with the patient or authorized  representative who has indicated his/her understanding and acceptance.   Dental advisory given  Plan Discussed with: CRNA  Anesthesia Plan Comments:         Anesthesia Quick Evaluation

## 2017-03-11 NOTE — Progress Notes (Signed)
Mr. Dunavant will be going for his bronchoscopy/EBUS today. This will be at [facility].  I had a very long talk with the patient and family this morning. I offered my opinion and recommendations. I think that the overriding factor here is that he is in very good shape. He is very active. I'll see anything that looks like coronary artery disease that is a problem. His echocardiogram looked okay. His blood pressure is under good control.  I not sure he really needs heparin. There is no obvious thrombus that is seen on aortic ultrasound.  I realize that we might be looking at metastatic disease. I think the only way for [place] to know that is to get a PET scan on him. Unfortunately, a PET scan cannot be done with the patient in the hospital.  Radiology said that an MRI would not help [place] out with respect to the hilar mass. As such, this was canceled.  I think that we really need to find out what we are dealing with. This is the only way for [place] to tell the patient and family what the options are for therapy if that is to be entertained.  Again, I just don't see that there is a lot of risk for doing the EBUS/bronchoscopy. I know that there is always risk when you dealing with somebody who is 83 years old. However, he appears to be in very good shape. I note that even this has been done before in patients who have a lot more health issues.  We really have to know what we are dealing with here. I would be surprised that this was small cell lung cancer. I would think non-small cell, probably squamous cell cancer would be more likely.  I just wish I could look at his CT scan that was done down at [facility].  He really would like something to drink. He is nothing by mouth. Apparently, his procedure will not be done until mid afternoon. Maybe, he have some ice chips.  His labs look good. His creatinine is 1.14. His potassium is up a little bit at 5.2. His hemoglobin is 11.8 and holding steady. He's  had no hemoptysis.  As far as therapy goes, I would have to think that the primary mode of therapy will be radiation. This might be considered palliative but at least it should be quite effective in causing tumor regression and decreasing risk for complication. Whether or not to use chemotherapy will really be dictated by the pathology.  I appreciate everybody's input. I really thought that when I saw him on Monday, that this was fairly straightforward as to what needed to be done.  I will continue pray hard for him. He and his family do have a lot of faith.   Lattie Haw, MD  Jeneen Rinks 1:5-7

## 2017-03-11 NOTE — Progress Notes (Signed)
Report called to [place] on [place] called for transport.  Barbee Shropshire. Brigitte Pulse, RN

## 2017-03-12 ENCOUNTER — Encounter (HOSPITAL_COMMUNITY): Payer: Self-pay | Admitting: Emergency Medicine

## 2017-03-12 ENCOUNTER — Other Ambulatory Visit: Payer: Self-pay | Admitting: Hematology & Oncology

## 2017-03-12 DIAGNOSIS — R0789 Other chest pain: Secondary | ICD-10-CM

## 2017-03-12 DIAGNOSIS — I7102 Dissection of abdominal aorta: Secondary | ICD-10-CM

## 2017-03-12 DIAGNOSIS — R42 Dizziness and giddiness: Secondary | ICD-10-CM

## 2017-03-12 DIAGNOSIS — Z720 Tobacco use: Secondary | ICD-10-CM

## 2017-03-12 DIAGNOSIS — I10 Essential (primary) hypertension: Secondary | ICD-10-CM

## 2017-03-12 DIAGNOSIS — N179 Acute kidney failure, unspecified: Secondary | ICD-10-CM

## 2017-03-12 DIAGNOSIS — E871 Hypo-osmolality and hyponatremia: Secondary | ICD-10-CM

## 2017-03-12 DIAGNOSIS — C3402 Malignant neoplasm of left main bronchus: Secondary | ICD-10-CM

## 2017-03-12 LAB — POTASSIUM: Potassium: 4.5 mmol/L (ref 3.5–5.1)

## 2017-03-12 MED ORDER — ATORVASTATIN CALCIUM 10 MG PO TABS: 10.0000 mg | ORAL_TABLET | Freq: Every day | ORAL | 0 refills | Status: DC

## 2017-03-12 MED ORDER — CARVEDILOL 3.125 MG PO TABS: 3.1250 mg | ORAL_TABLET | Freq: Two times a day (BID) | ORAL | 0 refills | Status: AC

## 2017-03-12 MED ORDER — AMLODIPINE BESYLATE 10 MG PO TABS: 10.0000 mg | ORAL_TABLET | Freq: Every day | ORAL | 0 refills | Status: DC

## 2017-03-12 NOTE — Progress Notes (Signed)
Discharge order received. Patient's Iv and telemetry monitor removed. CCMD notified. Patient provided with a copy of his AVS discharge paperwork. Patient's wife, daughter, and granddaughter, all at the bedside. Patient and his family verbalized understanding of discharge information and education and patient was able to teach back info. Patient verbalized understanding that he needed to go pick up his prescriptions from his pharmacy. Patient taken down in a  Wheelchair to front lobby where his family will be driving him home.

## 2017-03-12 NOTE — Progress Notes (Signed)
Mr. Feldhaus had his bronchoscopy/EBUS yesterday. There were no problems with this. He's had no problems with bleeding. From the bronchoscopy report, there is no mention of any endobronchial mass. Biopsies were taken from a couple lymph nodes in the hilar mass.  He has had no hemoptysis. He is eating well. He's not having any nausea or vomiting.  There is no problems with pain. He's had no abdominal pain. He's had no leg swelling.  There is no shortness of breath.  His vital signs all look stable. His blood pressure is 149/70.  On his physical exam, there really is no change. He has decent breath sounds bilaterally. Cardiac exam is regular rate and rhythm. Abdomen is soft. There is no abdominal mass. There is no palpable liver or spleen tip. Extremities shows no clubbing, cyanosis or edema.  At this point, we have to await the results of the pathology. This may take a day or so.  I cannot think of any inpatient tests that we need to do on him. I think if he wants to go home, that would be okay from my point of view. The main test that we really need to do is a PET scan and this cannot be done as an inpatient.  Once we have pathology, I will get him back to the office so we can decide upon the best treatment options. I will think that radiation probably would be the main intervention to use area and if this is small cell lung cancer, then we will clearly use chemotherapy along with radiation. If this is non-small cell lung cancer, then the role of chemotherapy might be as radiation sensitizer. I would absolutely get genetic studies including PD-L1 and tumor mutational burden (TMB) along with genetic markers.  I know that he is getting fantastic care from everybody up on [facility].  Lattie Haw, MD  Hebrews 10:36

## 2017-03-12 NOTE — Consult Note (Signed)
Hospital Consult    Reason for Consult:  ? Chronic aortic dissection Requesting Physician:  Dr. Ree Kida [facility] MRN #:  [id number]  History of Present Illness: This is a 83 y.o. male who presented to the ED a few days ago.  He has a mild chronic cough with a couple episodes of hemoptysis.  He became lightheaded and that is why he came to the ER.  He was also having associated chest and left sided scapular/should pain as well.  He had a CT scan, which revealed a left hilar mass that was suspicious for bronchogenic cancer with a tumor thrombus extending in the the left PA and possible chronic aortic dissection at the distal abdominal aorta with mural thrombus.He had enlarged aortopulmonary nodes that were suspicious for metastatic disease.  The pt was transferred from [place] to [facility].  He was subsequently transferred to [facility] for bronchoscopy.   He states that he did cough up a clot last night, but he thinks this was from the procedure he had.   He also has undergone aorta u/s, which revealed a Mildly ectatic infrarenal aorta without aneurysm. Please note, small dissections could be obscured by bowel gas.  He denies any abdominal pain, pain in his legs or non healing wounds.  He says after riding in his wife's car for a long period of time, he starts to get a little pain in the right foot.  He takes a daily aspirin.  He also takes a multivitamin.  Since being in the hospital, he has been placed on a beta blocker and CCB.    Past Medical History:  Diagnosis Date  . Hearing loss   . Sinus problem     Past Surgical History:  Procedure Laterality Date  . EYE SURGERY     bilateral cataracts in approx. 2016  . TONSILLECTOMY     age 86  . VASECTOMY    . VIDEO BRONCHOSCOPY WITH ENDOBRONCHIAL ULTRASOUND  03/11/2017   Procedure: VIDEO BRONCHOSCOPY WITH ENDOBRONCHIAL ULTRASOUND;  Surgeon: Collene Gobble, MD;  Location: [facility] OR;  Service: Thoracic;;    Allergies  Allergen Reactions  . No  Known Allergies     Prior to Admission medications   Medication Sig Start Date End Date Taking? Authorizing Provider  aspirin EC 81 MG tablet Take 81 mg by mouth daily.   Yes [provider]  Multiple Vitamins-Minerals (CENTRUM SILVER 50+MEN) TABS Take 1 tablet by mouth daily.   Yes [provider]  vitamin E 200 UNIT capsule Take 200 Units by mouth daily.   Yes [provider]    Social History   Social History  . Marital status: Married    Spouse name: N/A  . Number of children: N/A  . Years of education: N/A   Occupational History  . Not on file.   Social History Main Topics  . Smoking status: Former Smoker    Types: Cigarettes    Quit date: 03/10/1987  . Smokeless tobacco: Never Used  . Alcohol use Yes     Comment: occasional beer or occasional wine  . Drug use: No  . Sexual activity: Not on file   Other Topics Concern  . Not on file   Social History Narrative  . No narrative on file    Family Hx: Both parents had a hx of heart disease  ROS: [x]  Positive   [ ]  Negative   [ ]  All sytems reviewed and are negative  Cardiac: []  chest  pain/pressure []  palpitations []  SOB lying flat []  DOE  Vascular: []  pain in legs while walking []  pain in legs at rest []  pain in legs at night []  non-healing ulcers []  hx of DVT []  swelling in legs  Pulmonary: [x]  productive cough-hemoptysis a couple of weeks ago x 2  []  asthma/wheezing []  home O2  Neurologic: []  weakness in []  arms []  legs []  numbness in []  arms []  legs []  hx of CVA []  mini stroke [] difficulty speaking or slurred speech []  temporary loss of vision in one eye []  dizziness  Hematologic: []  hx of cancer []  bleeding problems []  problems with blood clotting easily [x]  left hilar mass and mediastinal lymphadenopathy  Endocrine:   []  diabetes []  thyroid disease  GI []  vomiting blood []  blood in stool  GU: []  CKD/renal failure []  HD--[]  M/W/F or []  T/T/S []  burning  with urination []  blood in urine  Psychiatric: []  anxiety []  depression  Musculoskeletal: []  arthritis []  joint pain  Integumentary: []  rashes []  ulcers  Constitutional: []  fever []  chills   Physical Examination  Vitals:   03/11/17 2135 03/12/17 0517  BP: 127/68 (!) 149/70  Pulse: 71 73  Resp:  19  Temp:  98 F (36.7 C)   Body mass index is 24.32 kg/m.  General:  WDWN in NAD Gait: Normal HENT: WNL, normocephalic Pulmonary: normal non-labored breathing, without Rales, rhonchi,  wheezing Cardiac: regular, without  Murmurs, rubs or gallops; without carotid bruits Abdomen:  soft, NT/ND, no masses Skin: without rashes Vascular Exam/Pulses:  Right Left  Radial Unable to palpate due to bandage. 2+ (normal)  Ulnar Unable to palpate  Unable to palpate   Femoral 3+ (hyperdynamic) 3+ (hyperdynamic)  Popliteal Unable to palpate  Unable to palpate   DP 2+ (normal) 2+ (normal)  PT Unable to palpate  Unable to palpate    Extremities: without ischemic changes, without Gangrene , without cellulitis; without open wounds;  Musculoskeletal: no muscle wasting or atrophy  Neurologic: A&O X 3;  No focal weakness or paresthesias are detected; speech is fluent/normal Psychiatric:  The pt has Normal affect. Lymph:  No inguinal lympadenopathy   CBC    Component Value Date/Time   WBC 8.9 03/11/2017 0607   RBC 4.15 (L) 03/11/2017 0607   HGB 11.8 (L) 03/11/2017 0607   HCT 35.9 (L) 03/11/2017 0607   PLT 262 03/11/2017 0607   MCV 86.5 03/11/2017 0607   MCH 28.4 03/11/2017 0607   MCHC 32.9 03/11/2017 0607   RDW 14.2 03/11/2017 0607   LYMPHSABS 2.5 03/11/2017 0607   MONOABS 0.7 03/11/2017 0607   EOSABS 0.4 03/11/2017 0607   BASOSABS 0.1 03/11/2017 0607    BMET    Component Value Date/Time   NA 136 03/11/2017 0607   K 4.5 03/12/2017 1021   CL 102 03/11/2017 0607   CO2 28 03/11/2017 0607   GLUCOSE 118 (H) 03/11/2017 0607   BUN 17 03/11/2017 0607   CREATININE 1.14  03/11/2017 0607   CALCIUM 8.7 (L) 03/11/2017 0607   GFRNONAA 58 (L) 03/11/2017 0607   GFRAA >60 03/11/2017 6301    COAGS: Lab Results  Component Value Date   INR 1.03 03/09/2017     Non-Invasive Vascular Imaging:   CT scan chest/abdomen/pelvis at [facility]: CT scan of chest abdomen pelvis was done showing left hilar mass suspicious for bronchogenic cancer with a tumor thrombus extending into the left pulmonary artery, possible chronic aortic dissection at the distal abdominal aorta with a mural thrombus,  enlarged aortopulmonary nodes suspicious for metastatic disease.  Statin:  No. Beta Blocker:  Yes Aspirin:  Yes.   ACEI:  No. ARB:  No. CCB use:  Yes Other antiplatelets/anticoagulants:  No.    ASSESSMENT/PLAN: This is a 83 y.o. male with possible chronic aortic dissection of the distal aorta that is an incidental finding for workup for lung mass   -Dr. Donzetta Matters did view the CT scan from [place].  Pt has not had any abdominal pain and he does have 3+ palpable femoral pulses bilaterally. -we will have him f/u in 3 months with CTA of abdomen and pelvis.  He would like to have this done and followed up at the [facility]  If he is unable to get this done over there, then he can follow up with Dr. Donzetta Matters in 3 months. -recommend that he is discharged on aspirin and Statin -discussed with pt if he experiences sudden, unbearable abdominal pain, to contact 911 or get to nearest ER.  He and his family expressed understanding.    Leontine Locket, PA-C [facility] [phone]  I have independently interviewed patient and agree with PA assessment and plan above. Likely an incidental finding but should be followed in 3-6 months. He is planning on transitioning care to [place] and that is fine. Family and patient demonstrate good understanding and can f/u in our office if [place] does not work out.   Elisha Mcgruder C. Donzetta Matters, MD [facility] [place] Office:  [phone] Pager: [phone]

## 2017-03-12 NOTE — Consult Note (Signed)
           [facility] CM Primary Care Navigator  03/12/2017  Harry Hinton 1933/07/23 [id number]   Went to see patientat the bedsideto identify possible discharge needs but he was alreadydischarged.  Patient was discharged home today per RN report.  Primary care provider's office called (Dr. Janace Litten with [facility]) but was told that this provider is no longer with the practice since May 15 and was given a phone number to contact.  Attempted to call Dr. Janace Litten' new office (spoke to [place]). Provider is now with [facility] - an affiliate of [facility] which is not under the network of [place].    For questions, please contact:  Dannielle Huh, BSN, RN- [facility] Primary Care Navigator  Telephone: [phone] [place]

## 2017-03-12 NOTE — Care Management Note (Signed)
Case Management Note  Patient Details  Name: Harry Hinton MRN: [id number] Date of Birth: Apr 29, 1933  Subjective/Objective:                 Patient with order to DC to home, No CM needs identified at this time.    Action/Plan:   Expected Discharge Date:  03/12/17               Expected Discharge Plan:  Home/Self Care  In-House Referral:     Discharge planning Services  CM Consult  Post Acute Care Choice:    Choice offered to:     DME Arranged:    DME Agency:     HH Arranged:    HH Agency:     Status of Service:  Completed, signed off  If discussed at [employer] of Stay Meetings, dates discussed:    Additional Comments:  Carles Collet, RN 03/12/2017, 2:11 PM

## 2017-03-12 NOTE — Discharge Summary (Signed)
Physician Discharge Summary  Harry Hinton CZY:[id number] DOB: [birth year] DOA: 03/09/2017  PCP: Harry Broker, MD  Admit date: 03/09/2017 Discharge date: 03/12/2017  Time spent: 45 minutes  Recommendations for Outpatient Follow-up:  Patient will need to follow up with primary care provider within one week of discharge.  Follow up with Dr. Marin Olp, oncologist, within 1-2 weeks, will need PET scan  Follow up with Dr. Lamonte Sakai, pulmonlogist within 1- 2 weeks. Follow up with Dr. Donzetta Matters, vascular surgery, within 3-6 months and repeat imaging.   Patient should continue medications as prescribed.   Patient should follow a heart healthy diet.    Discharge Diagnoses:  Left-sided hilar mass with hemoptysis Atypical chest pain Essential hypertension Mild acute kidney injury ? Chronic aortic dissection, distal abdominal aorta with peripheral thrombus Dizziness Tobacco abuse Anemia of chronic disease Mild hyponatremia Hyperkalemia   Discharge Condition: Stable  Diet recommendation: Heart healthy  Filed Weights   03/10/17 0424 03/11/17 0500 03/12/17 0517  Weight: 82.6 kg (182 lb 1.6 oz) 83.7 kg (184 lb 8.4 oz) 83.6 kg (184 lb 4.8 oz)    History of present illness:  On 03/09/2017 by Dr. Lala Lund [name]  is a 83 y.o. male, With no previous medical problems, has not seen a physician in many years, and on no home medications, does smoke one pack of cigarettes daily quit about 2 weeks ago, has had issues with mild chronic cough with 2 episodes of hemoptysis about 2 weeks ago. He basically seek medical attention today after experiencing some lightheadedness this morning associated with weight chest and left-sided scapular/shoulder pain ongoing for 12-14 hours. He went to render also ER where his workup showed high blood pressure, after initial blood work which was unremarkable CT scan of chest abdomen pelvis was done showing left hilar mass suspicious for bronchogenic cancer with a tumor  thrombus extending into the left pulmonary artery, possible chronic aortic dissection at the distal abdominal aorta with a mural thrombus, enlarged aortopulmonary nodes suspicious for metastatic disease.  [facility] ER physician discussed the case with pulmonary physician on call Dr. Lake Bells who requested the patient to be transferred to [facility] ER under hospitalist service. Patient currently symptom-free.  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 Results pending -Patient will need outpatient PET scan -Currently saturations in the high 90s on room air  Atypical chest pain -No further chest pain, likely secondary to hilar mass. -Echocardiogram showed an EF of 09-32%, grade 1 diastolic dysfunction  Essential hypertension -Continue Coreg and Norvasc -Follow-up with primary care physician  Mild acute kidney injury -Likely secondary to dehydration -Resolved  ? Chronic aortic dissection, distal abdominal aorta with peripheral thrombus -Possibly secondary to the above -Discussed with Dr. Lamonte Sakai, no need for anticoagulation at this time. PET scan may help differentiate this further -Patient is a former smoker, have advised him to follow-up with vascular for repeat imaging within 3-6 months -Discussed with Dr. Donzetta Matters, vascular surgery, recommended repeat CTA in 3-6 months. Patient may want to follow up with the [facility].  -Have discussed this at length with patient and family members at bedside. -Currently not having abdominal pain -Continue aspirin -Will discharge with statin  Dizziness -Resolved, possibly due to dehydration as patient was mild hyponatremic on admission -Not orthostatic  Tobacco abuse -Discussed smoking cessation  Anemia  of chronic disease -  Ferritin 107 -Hemoglobin has remained stable, without signs of bleeding. No longer having hemoptysis  Mild hyponatremia -Improved with IV fluids  Hyperkalemia  -Resolved -Counseled patient on dietary intake of potassium  Procedures:  Bronchoscopy with EBUS  Echocardiogram  Consultations:  Pulmonary  Oncology  Vascular surgery  Discharge Exam: Vitals:   03/11/17 2135 03/12/17 0517  BP: 127/68 (!) 149/70  Pulse: 71 73  Resp:  19  Temp:  98 F (36.7 C)   Patient currently denies any chest pain, shortness of breath, abdominal pain, nausea or vomiting, diarrhea or constipation. Eager to get home.    General: Well developed, well nourished, NAD, appears stated age  HEENT: NCAT, mucous membranes moist.  Cardiovascular: S1 S2 auscultated, no rubs, murmurs or gallops. Regular rate and rhythm.  Respiratory: Diminished breath sounds lower left lung  Abdomen: Soft, nontender, nondistended, + bowel sounds  Extremities: warm dry without cyanosis clubbing. Mild nonpitting LE edema  Neuro: AAOx3, nonfocal  Psych: Normal affect and demeanor with intact judgement and insight  Discharge Instructions Discharge Instructions    Discharge instructions    Complete by:  As directed    Patient will need to follow up with primary care provider within one week of discharge.  Follow up with Dr. Marin Olp, oncologist, within 1-2 weeks, will need PET scan  Follow up with Dr. Lamonte Sakai, pulmonlogist within 1- 2 weeks. Follow up with Dr. Donzetta Matters, vascular surgery, within 3-6 months and repeat imaging.  Patient should continue medications as prescribed.   Patient should follow a heart healthy diet.     Current Discharge Medication List    START taking these medications   Details  amLODipine (NORVASC) 10 MG tablet Take 1 tablet (10 mg total) by mouth daily. Qty: 30 tablet, Refills: 0    atorvastatin (LIPITOR) 10 MG tablet Take 1 tablet (10 mg total) by mouth  daily. Qty: 30 tablet, Refills: 0    carvedilol (COREG) 3.125 MG tablet Take 1 tablet (3.125 mg total) by mouth 2 (two) times daily with a meal. Qty: 60 tablet, Refills: 0      CONTINUE these medications which have NOT CHANGED   Details  aspirin EC 81 MG tablet Take 81 mg by mouth daily.    Multiple Vitamins-Minerals (CENTRUM SILVER 50+MEN) TABS Take 1 tablet by mouth daily.    vitamin E 200 UNIT capsule Take 200 Units by mouth daily.       Allergies  Allergen Reactions  . No Known Allergies    Follow-up Information    Waynetta Sandy, MD Follow up in 3 month(s).   Specialties:  Vascular Surgery, Cardiology Why:  Call to schedule if you cannot get CTA of abdomen/pelvis for follow up at the [facility] hospital. Contact information: [address] [place] [address] [phone]        Harry Broker, MD. Schedule an appointment as soon as possible for a visit in 1 week(s).   Specialty:  Family Medicine Why:  Hospital follow up Contact information: [address] [place] [address]        Volanda Napoleon, MD. Schedule an appointment as soon as possible for a visit in 1 week(s).   Specialty:  Oncology Why:  Hospital follow up, PET scan Contact information: [place] [address] [phone]        Collene Gobble, MD. Schedule an appointment as soon as possible for a visit in 1 week(s).   Specialty:  Pulmonary Disease Why:  Hospital follow up Contact information: [address] [place] [address] [phone]            The results of significant diagnostics from this hospitalization (including imaging, microbiology, ancillary and laboratory) are listed below for reference.    Significant Diagnostic Studies: US Aorta  Result Date: 03/09/2017 CLINICAL DATA:  Assess aortic dissection and mural thrombus. History of chest mass. EXAM: ULTRASOUND OF ABDOMINAL AORTA TECHNIQUE: Ultrasound examination of the  abdominal aorta was performed to evaluate for abdominal aortic aneurysm. COMPARISON:  CT angiogram of the chest, abdomen and pelvis Mar 09, 2017 at 0503 hours FINDINGS: Abdominal aortic measurements as follows: Maximum aortic dimension: 2.6 x 2.5 cm (as seen on prior CT). Echogenic calcific atherosclerosis. RIGHT Common iliac: Maximal dimension 1.1 cm. LEFT Common iliac colon maximum dimension 1.1 cm. Normal appearance of the kidneys. Technologist reports generally limited examination due to bowel gas. IMPRESSION: Mildly ectatic infrarenal aorta without aneurysm. Please note, small dissections could be obscured by bowel gas. Electronically Signed   By: Elon Alas M.D.   On: 03/09/2017 22:49    Microbiology: Recent Results (from the past 240 hour(s))  MRSA PCR Screening     Status: None   Collection Time: 03/11/17  1:45 PM  Result Value Ref Range Status   MRSA by PCR NEGATIVE NEGATIVE Final    Comment:        The GeneXpert MRSA Assay (FDA approved for NASAL specimens only), is one component of a comprehensive MRSA colonization surveillance program. It is not intended to diagnose MRSA infection nor to guide or monitor treatment for MRSA infections.      Labs: Basic Metabolic Panel:  Recent Labs Lab 03/09/17 0928 03/10/17 0545 03/11/17 0607 03/12/17 1021  NA 135 133* 136  --   K 4.4 5.0 5.2* 4.5  CL 101 99* 102  --   CO2 28 26 28   --   GLUCOSE 116* 119* 118*  --   BUN 20 23* 17  --   CREATININE 1.18 1.37* 1.14  --   CALCIUM 8.4* 8.6* 8.7*  --   MG  --   --  2.0  --   PHOS  --   --  3.0  --    Liver Function Tests:  Recent Labs Lab 03/09/17 0928 03/11/17 0607  AST 19 18  ALT 14* 12*  ALKPHOS 88 77  BILITOT 0.3 0.4  PROT 8.0 7.8  ALBUMIN 3.1* 2.9*   No results for input(s): LIPASE, AMYLASE in the last 168 hours. No results for input(s): AMMONIA in the last 168 hours. CBC:  Recent Labs Lab 03/09/17 0928 03/10/17 0545 03/11/17 0607  WBC 10.6* 10.5 8.9   NEUTROABS  --   --  5.3  HGB 11.8* 12.0* 11.8*  HCT 35.9* 36.3* 35.9*  MCV 86.1 85.8 86.5  PLT 266 294 262   Cardiac Enzymes: No results for input(s): CKTOTAL, CKMB, CKMBINDEX, TROPONINI in the last 168 hours. BNP: BNP (last 3 results) No results for input(s): BNP in the last 8760 hours.  ProBNP (last 3 results) No results for input(s): PROBNP in the last 8760 hours.  CBG: No results for input(s): GLUCAP in the last 168 hours.     SignedCristal Ford  [facility] 03/12/2017, 1:06 PM

## 2017-03-12 NOTE — Anesthesia Postprocedure Evaluation (Signed)
Anesthesia Post Note  Patient: Harry Hinton  Procedure(s) Performed: Procedure(s): VIDEO BRONCHOSCOPY WITH ENDOBRONCHIAL ULTRASOUND  Patient location during evaluation: PACU Anesthesia Type: General Level of consciousness: awake and alert Pain management: pain level controlled Vital Signs Assessment: post-procedure vital signs reviewed and stable Respiratory status: spontaneous breathing, nonlabored ventilation, respiratory function stable and patient connected to nasal cannula oxygen Cardiovascular status: blood pressure returned to baseline and stable Postop Assessment: no signs of nausea or vomiting Anesthetic complications: no       Last Vitals:  Vitals:   03/11/17 2135 03/12/17 0517  BP: 127/68 (!) 149/70  Pulse: 71 73  Resp:  19  Temp:  36.7 C    Last Pain:  Vitals:   03/12/17 0517  TempSrc: Oral  PainSc:                  Catalina Gravel

## 2017-03-13 ENCOUNTER — Telehealth: Payer: Self-pay | Admitting: Emergency Medicine

## 2017-03-13 NOTE — Telephone Encounter (Signed)
Called and notified pt that his path results are still pending. Doubt they will be reported before the weekend. I will call him next week with the results once available.

## 2017-03-17 NOTE — Telephone Encounter (Signed)
Pathology and cytology results are still not back. Unclear to me why there is a delay. Try to contact pathology department but they are unavailable at this time. I will call them again tomorrow.

## 2017-03-18 NOTE — Telephone Encounter (Signed)
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 I reviewed with him. He has already spoken to Dr Katheran Awe, has a PET scan ordered. Unfortunately not enough tissue for molecular studies. He will plan to follow w Dr Katheran Awe to guide next steps.

## 2017-03-19 ENCOUNTER — Other Ambulatory Visit: Payer: Self-pay | Admitting: *Deleted

## 2017-03-19 ENCOUNTER — Telehealth: Payer: Self-pay | Admitting: Emergency Medicine

## 2017-03-19 DIAGNOSIS — R918 Other nonspecific abnormal finding of lung field: Secondary | ICD-10-CM

## 2017-03-19 DIAGNOSIS — I10 Essential (primary) hypertension: Secondary | ICD-10-CM | POA: Diagnosis not present

## 2017-03-19 DIAGNOSIS — I7102 Dissection of abdominal aorta: Secondary | ICD-10-CM | POA: Diagnosis not present

## 2017-03-19 DIAGNOSIS — C3490 Malignant neoplasm of unspecified part of unspecified bronchus or lung: Secondary | ICD-10-CM | POA: Diagnosis not present

## 2017-03-19 NOTE — Telephone Encounter (Signed)
Will route message to RB so that he can call the pt's daughter, Chong Sicilian.

## 2017-03-19 NOTE — Telephone Encounter (Signed)
Pt called back to give verbal consent to talk daughter Chong Sicilian), pt states it is ok to give PHI to daughter, and to Wife [name]), pt contact # [phone]...ert

## 2017-03-19 NOTE — Telephone Encounter (Signed)
Patient daughter Chong Sicilian called and wants to make sure RB has her phone number [phone] -pr

## 2017-03-19 NOTE — Telephone Encounter (Signed)
Spoke with pt's daughter, Chong Sicilian. Advised her that she is not listed on the pt's HIPAA form and I could not speak with her. She will have the pt call back and give [place] verbal consent to speak to her. Will await his call.

## 2017-03-20 NOTE — Telephone Encounter (Signed)
RB please advise once you have spoken with the pts daughter. Thanks

## 2017-03-23 ENCOUNTER — Encounter (HOSPITAL_COMMUNITY)
Admission: RE | Admit: 2017-03-23 | Discharge: 2017-03-23 | Disposition: A | Discharge status: Home / Self Care | Payer: Medicare Other | Source: Ambulatory Visit | Attending: Hematology & Oncology | Admitting: Hematology & Oncology

## 2017-03-23 ENCOUNTER — Encounter: Payer: Self-pay | Admitting: Hematology & Oncology

## 2017-03-23 DIAGNOSIS — C3402 Malignant neoplasm of left main bronchus: Secondary | ICD-10-CM | POA: Diagnosis not present

## 2017-03-23 DIAGNOSIS — C3492 Malignant neoplasm of unspecified part of left bronchus or lung: Secondary | ICD-10-CM | POA: Diagnosis not present

## 2017-03-23 LAB — GLUCOSE, CAPILLARY: GLUCOSE-CAPILLARY: 114 mg/dL — AB (ref 65–99)

## 2017-03-23 MED ORDER — FLUDEOXYGLUCOSE F - 18 (FDG) INJECTION
9.1000 | Freq: Once | INTRAVENOUS | Status: AC | PRN
  Administered 2017-03-23: 9.1 via INTRAVENOUS

## 2017-03-23 NOTE — Telephone Encounter (Signed)
Spoke with patient daughter Chong Sicilian, requesting a call from Dr Lamonte Sakai on biopsy results.  States that [facility] called on 03/19/17 and spoke with the patient but he did not have his hearing aides in and was not 100% what all he as he was unable to relay any information to his daughter. Patient has given verbal consent to speak to [facility] regarding these results - she requests a call back at [phone]

## 2017-03-23 NOTE — Telephone Encounter (Signed)
RB any updates on you speaking with the pts daughter ?  Thanks

## 2017-03-23 NOTE — Telephone Encounter (Signed)
Daughter Chong Sicilian) called back about results, contact # [phone].Marland Kitchenert

## 2017-03-24 NOTE — Telephone Encounter (Signed)
Called and left a message for Patty. Explained the bx results and that next steps would be to discus w dr Katheran Awe. I also told her that Dr Katheran Awe may not be able to make a complete action plan without more tissue. If that is the case then we could discuss pro / cons of another biopsy (either FOB or otherwise)  Please set pt up with an OV ASAP with Dr Katheran Awe. Thanks.

## 2017-03-24 NOTE — Telephone Encounter (Signed)
Referral has been placed for Dr. Marin Olp.

## 2017-03-25 ENCOUNTER — Encounter (HOSPITAL_COMMUNITY): Payer: Medicare Other

## 2017-03-26 ENCOUNTER — Ambulatory Visit: Payer: Medicare Other

## 2017-03-26 ENCOUNTER — Ambulatory Visit (HOSPITAL_BASED_OUTPATIENT_CLINIC_OR_DEPARTMENT_OTHER): Payer: Medicare Other | Admitting: Hematology & Oncology

## 2017-03-26 ENCOUNTER — Encounter: Payer: Self-pay | Admitting: Hematology & Oncology

## 2017-03-26 ENCOUNTER — Other Ambulatory Visit (HOSPITAL_BASED_OUTPATIENT_CLINIC_OR_DEPARTMENT_OTHER): Payer: Medicare Other

## 2017-03-26 DIAGNOSIS — Z7189 Other specified counseling: Secondary | ICD-10-CM

## 2017-03-26 DIAGNOSIS — C3492 Malignant neoplasm of unspecified part of left bronchus or lung: Secondary | ICD-10-CM | POA: Diagnosis not present

## 2017-03-26 DIAGNOSIS — D500 Iron deficiency anemia secondary to blood loss (chronic): Secondary | ICD-10-CM

## 2017-03-26 DIAGNOSIS — C3402 Malignant neoplasm of left main bronchus: Secondary | ICD-10-CM

## 2017-03-26 HISTORY — DX: Other specified counseling: Z71.89

## 2017-03-26 LAB — CBC WITH DIFFERENTIAL (CANCER CENTER ONLY)
BASO#: 0 10*3/uL (ref 0.0–0.2)
BASO%: 0.4 % (ref 0.0–2.0)
EOS%: 3.9 % (ref 0.0–7.0)
Eosinophils Absolute: 0.4 10*3/uL (ref 0.0–0.5)
HEMATOCRIT: 36.1 % — AB (ref 38.7–49.9)
HGB: 12 g/dL — ABNORMAL LOW (ref 13.0–17.1)
LYMPH#: 2.6 10*3/uL (ref 0.9–3.3)
LYMPH%: 27.8 % (ref 14.0–48.0)
MCH: 28.8 pg (ref 28.0–33.4)
MCHC: 33.2 g/dL (ref 32.0–35.9)
MCV: 87 fL (ref 82–98)
MONO#: 0.9 10*3/uL (ref 0.1–0.9)
MONO%: 8.9 % (ref 0.0–13.0)
NEUT#: 5.6 10*3/uL (ref 1.5–6.5)
NEUT%: 59 % (ref 40.0–80.0)
Platelets: 283 10*3/uL (ref 145–400)
RBC: 4.17 10*6/uL — ABNORMAL LOW (ref 4.20–5.70)
RDW: 13.9 % (ref 11.1–15.7)
WBC: 9.5 10*3/uL (ref 4.0–10.0)

## 2017-03-26 LAB — CMP (CANCER CENTER ONLY)
ALK PHOS: 92 U/L — AB (ref 26–84)
ALT(SGPT): 24 U/L (ref 10–47)
AST: 24 U/L (ref 11–38)
Albumin: 2.9 g/dL — ABNORMAL LOW (ref 3.3–5.5)
BUN, Bld: 18 mg/dL (ref 7–22)
CALCIUM: 9.3 mg/dL (ref 8.0–10.3)
CO2: 31 meq/L (ref 18–33)
Chloride: 95 mEq/L — ABNORMAL LOW (ref 98–108)
Creat: 1.2 mg/dl (ref 0.6–1.2)
Glucose, Bld: 97 mg/dL (ref 73–118)
POTASSIUM: 5.1 meq/L — AB (ref 3.3–4.7)
Sodium: 133 mEq/L (ref 128–145)
Total Bilirubin: 0.6 mg/dl (ref 0.20–1.60)
Total Protein: 8.3 g/dL — ABNORMAL HIGH (ref 6.4–8.1)

## 2017-03-26 LAB — FERRITIN: Ferritin: 173 ng/ml (ref 22–316)

## 2017-03-26 LAB — IRON AND TIBC
%SAT: 17 % — ABNORMAL LOW (ref 20–55)
Iron: 44 ug/dL (ref 42–163)
TIBC: 253 ug/dL (ref 202–409)
UIBC: 209 ug/dL (ref 117–376)

## 2017-03-26 NOTE — Progress Notes (Addendum)
Hematology and Oncology Follow Up Visit  Harry Hinton [id number] December 15, 1932 83 y.o. 03/26/2017   Principle Diagnosis:   Locally advanced non-small cell lung cancer of the left lung - Stage IIIB (T3N2M0)  - SCCa  Current Therapy:    Patient to start concurrent radiation chemotherapy     Interim History:  Harry Hinton is in for his first office visit. I saw him over the [facility] Day weekend. He is from [place]. He came in with some hemoptysis. He had a CT scan done down in [place]. This was done on May 28. This showed a left hilar mass measuring 5.7 x 3.8 x 5.6 cm. It extended into the left infrahilar region. It abuts the descending thoracic aorta.  He was brought up to [facility]. He ultimately had a bronchoscopy with EBUS. Unfortunately, very little tissue was biopsied. However, one cytology was positive.  The pathology report ([id number]) showed non-small cell lung cancer. It was felt to be squamous cell carcinoma. Unfortunately, no further tissue was available for additional studies.  He was given a PET scan on June 11. This showed a very metabolically active tumor in the left hilum. It measures 5.9 cm. There is a positive left paratracheal lymph node. Also noted was some focal subpleural soft tissue thickening in the left lung adjacent to the descending thoracic aorta. This measures 5.7 x 2.3 cm.  He has been in great shape. He plays golf every week. He is eating well. He has not lost weight. He has had no hemoptysis. He has had no change in bowel or bladder habits. There's been no headaches.  Overall, his performance status is ECOG 0.   Medications:  Current Outpatient Prescriptions:  .  amLODipine (NORVASC) 10 MG tablet, Take 1 tablet (10 mg total) by mouth daily., Disp: 30 tablet, Rfl: 0 .  aspirin EC 81 MG tablet, Take 81 mg by mouth daily., Disp: , Rfl:  .  atorvastatin (LIPITOR) 10 MG tablet, Take 1 tablet (10 mg total) by mouth daily., Disp: 30 tablet, Rfl: 0 .   carvedilol (COREG) 3.125 MG tablet, Take 1 tablet (3.125 mg total) by mouth 2 (two) times daily with a meal., Disp: 60 tablet, Rfl: 0 .  Multiple Vitamins-Minerals (CENTRUM SILVER 50+MEN) TABS, Take 1 tablet by mouth daily., Disp: , Rfl:  .  vitamin E 200 UNIT capsule, Take 200 Units by mouth daily., Disp: , Rfl:   Allergies:  Allergies  Allergen Reactions  . No Known Allergies     Past Medical History, Surgical history, Social history, and Family History were reviewed and updated.  Review of Systems:  As above   Physical Exam: 98.1,  71, 154/68,  Wt is 184lb  Wt Readings from Last 3 Encounters:  03/12/17 184 lb 4.8 oz (83.6 kg)     Well-developed and well-nourished white male in no obvious distress. Head and neck exam shows no ocular or oral lesions. He has no palpable cervical or supraclavicular lymph nodes. Lungs are clear bilaterally. Cardiac exam regular rate and rhythm with no murmurs, rubs or bruits. Abdomen is soft. Has good bowel sounds. There is no fluid wave. There is no palpable liver or spleen tip. Back exam shows no tenderness over the spine, ribs or hips. Extremities shows no clubbing, cyanosis or edema. Neurological exam shows no focal neurological deficits. Skin exam shows no rashes, ecchymoses or petechia.   Lab Results  Component Value Date   WBC 9.5 03/26/2017   HGB 12.0 (L) 03/26/2017  HCT 36.1 (L) 03/26/2017   MCV 87 03/26/2017   PLT 283 03/26/2017     Chemistry      Component Value Date/Time   NA 133 03/26/2017 1102   K 5.1 (H) 03/26/2017 1102   CL 95 (L) 03/26/2017 1102   CO2 31 03/26/2017 1102   BUN 18 03/26/2017 1102   CREATININE 1.2 03/26/2017 1102      Component Value Date/Time   CALCIUM 9.3 03/26/2017 1102   ALKPHOS 92 (H) 03/26/2017 1102   AST 24 03/26/2017 1102   ALT 24 03/26/2017 1102   BILITOT 0.60 03/26/2017 1102         Impression and Plan: Harry Hinton is a 83 year old white male. He has local advanced non-small cell lung  cancer of the left lung. Clinically, this looks like stage IIIB.  He is in fantastic shape. As such, I think that we have a very good chance of helping him and potentially curing this.  I think that we will need a combination of both radiation and chemotherapy.  I spent an hour with he and his family. I explained to them the situation. I showed them the PET scan.  I explained why both radiation and chemotherapy would be the recommended protocol. Because he is in great shape. He couldn't tolerate the side effects of combined therapy.  He will need to have a Port-A-Cath placed.  For chemotherapy, I would probably use carboplatin/etoposide. I think this would be very reasonable to do. We would give reduced dose therapy.  I spoke to the radiation oncologist down [place]. I the he should have his radiation done down there as he lives there and is to be a lot more convenient for him.  He these have an MRI or CT of the brain. I will have to get this set up for him.  It is unfortunate that we don't have a lot of material to send off additional studies. However, given that this is not metastatic disease, we should be able to get away with not having to run the panel of markers.  I would like to get started with treatment on June 25.  I gave Harry Hinton and his family information sheets about the chemotherapy protocol.  I would like to see him back when he starts treatment.   Volanda Napoleon, MD 6/14/20185:53 PM

## 2017-03-31 ENCOUNTER — Encounter: Payer: Self-pay | Admitting: *Deleted

## 2017-03-31 DIAGNOSIS — C44222 Squamous cell carcinoma of skin of right ear and external auricular canal: Secondary | ICD-10-CM | POA: Diagnosis not present

## 2017-03-31 DIAGNOSIS — L814 Other melanin hyperpigmentation: Secondary | ICD-10-CM | POA: Diagnosis not present

## 2017-03-31 DIAGNOSIS — C44629 Squamous cell carcinoma of skin of left upper limb, including shoulder: Secondary | ICD-10-CM | POA: Diagnosis not present

## 2017-04-03 ENCOUNTER — Other Ambulatory Visit: Payer: Self-pay | Admitting: Radiology

## 2017-04-03 DIAGNOSIS — C3482 Malignant neoplasm of overlapping sites of left bronchus and lung: Secondary | ICD-10-CM | POA: Diagnosis not present

## 2017-04-05 ENCOUNTER — Other Ambulatory Visit: Payer: Self-pay | Admitting: Radiology

## 2017-04-06 ENCOUNTER — Other Ambulatory Visit: Payer: Medicare Other

## 2017-04-06 ENCOUNTER — Encounter: Payer: Self-pay | Admitting: *Deleted

## 2017-04-06 ENCOUNTER — Other Ambulatory Visit: Payer: Self-pay | Admitting: Hematology & Oncology

## 2017-04-06 ENCOUNTER — Other Ambulatory Visit: Payer: Self-pay | Admitting: *Deleted

## 2017-04-06 ENCOUNTER — Ambulatory Visit (HOSPITAL_COMMUNITY)
Admission: RE | Admit: 2017-04-06 | Discharge: 2017-04-06 | Disposition: A | Discharge status: Home / Self Care | Payer: Medicare Other | Source: Ambulatory Visit | Attending: Hematology & Oncology | Admitting: Hematology & Oncology

## 2017-04-06 ENCOUNTER — Encounter (HOSPITAL_COMMUNITY): Payer: Self-pay

## 2017-04-06 DIAGNOSIS — Z7189 Other specified counseling: Secondary | ICD-10-CM

## 2017-04-06 DIAGNOSIS — C3492 Malignant neoplasm of unspecified part of left bronchus or lung: Secondary | ICD-10-CM | POA: Insufficient documentation

## 2017-04-06 DIAGNOSIS — Z87891 Personal history of nicotine dependence: Secondary | ICD-10-CM | POA: Diagnosis not present

## 2017-04-06 DIAGNOSIS — C090 Malignant neoplasm of tonsillar fossa: Secondary | ICD-10-CM

## 2017-04-06 DIAGNOSIS — Z7982 Long term (current) use of aspirin: Secondary | ICD-10-CM | POA: Insufficient documentation

## 2017-04-06 DIAGNOSIS — C3402 Malignant neoplasm of left main bronchus: Secondary | ICD-10-CM

## 2017-04-06 DIAGNOSIS — Z85118 Personal history of other malignant neoplasm of bronchus and lung: Secondary | ICD-10-CM | POA: Diagnosis not present

## 2017-04-06 DIAGNOSIS — Z452 Encounter for adjustment and management of vascular access device: Secondary | ICD-10-CM | POA: Diagnosis not present

## 2017-04-06 DIAGNOSIS — D500 Iron deficiency anemia secondary to blood loss (chronic): Secondary | ICD-10-CM

## 2017-04-06 HISTORY — PX: IR FLUORO GUIDE PORT INSERTION RIGHT: IMG5741

## 2017-04-06 HISTORY — PX: IR US GUIDE VASC ACCESS RIGHT: IMG2390

## 2017-04-06 LAB — BASIC METABOLIC PANEL
Anion gap: 10 (ref 5–15)
BUN: 23 mg/dL — ABNORMAL HIGH (ref 6–20)
CHLORIDE: 96 mmol/L — AB (ref 101–111)
CO2: 26 mmol/L (ref 22–32)
Calcium: 9 mg/dL (ref 8.9–10.3)
Creatinine, Ser: 1.2 mg/dL (ref 0.61–1.24)
GFR calc non Af Amer: 55 mL/min — ABNORMAL LOW (ref >60)
Glucose, Bld: 99 mg/dL (ref 65–99)
POTASSIUM: 4.5 mmol/L (ref 3.5–5.1)
SODIUM: 132 mmol/L — AB (ref 135–145)

## 2017-04-06 LAB — CBC WITH DIFFERENTIAL/PLATELET
Basophils Absolute: 0.1 10*3/uL (ref 0.0–0.1)
Basophils Relative: 1 %
Eosinophils Absolute: 0.6 10*3/uL (ref 0.0–0.7)
Eosinophils Relative: 5 %
HEMATOCRIT: 37.6 % — AB (ref 39.0–52.0)
HEMOGLOBIN: 12.7 g/dL — AB (ref 13.0–17.0)
LYMPHS PCT: 28 %
Lymphs Abs: 3.3 10*3/uL (ref 0.7–4.0)
MCH: 28.8 pg (ref 26.0–34.0)
MCHC: 33.8 g/dL (ref 30.0–36.0)
MCV: 85.3 fL (ref 78.0–100.0)
Monocytes Absolute: 0.9 10*3/uL (ref 0.1–1.0)
Monocytes Relative: 8 %
NEUTROS PCT: 58 %
Neutro Abs: 6.7 10*3/uL (ref 1.7–7.7)
Platelets: 297 10*3/uL (ref 150–400)
RBC: 4.41 MIL/uL (ref 4.22–5.81)
RDW: 14.4 % (ref 11.5–15.5)
WBC: 11.5 10*3/uL — AB (ref 4.0–10.5)

## 2017-04-06 LAB — PROTIME-INR
INR: 0.98
Prothrombin Time: 13 seconds (ref 11.4–15.2)

## 2017-04-06 MED ORDER — CEFAZOLIN SODIUM-DEXTROSE 2-4 GM/100ML-% IV SOLN: 2.0000 g | INTRAVENOUS | Status: DC

## 2017-04-06 MED ORDER — PROCHLORPERAZINE MALEATE 10 MG PO TABS: 10.0000 mg | ORAL_TABLET | Freq: Four times a day (QID) | ORAL | 1 refills | Status: DC | PRN

## 2017-04-06 MED ORDER — MIDAZOLAM HCL 2 MG/2ML IJ SOLN
INTRAMUSCULAR | Status: AC | PRN
  Administered 2017-04-06 (×2): 0.5 mg via INTRAVENOUS
  Administered 2017-04-06: 1 mg via INTRAVENOUS

## 2017-04-06 MED ORDER — FENTANYL CITRATE (PF) 100 MCG/2ML IJ SOLN
INTRAMUSCULAR | Status: AC
  Filled 2017-04-06: qty 4

## 2017-04-06 MED ORDER — LIDOCAINE HCL 1 % IJ SOLN
INTRAMUSCULAR | Status: AC
  Filled 2017-04-06: qty 20

## 2017-04-06 MED ORDER — SODIUM CHLORIDE 0.9 % IV SOLN
INTRAVENOUS | Status: DC
  Administered 2017-04-06: 13:00:00 via INTRAVENOUS

## 2017-04-06 MED ORDER — LIDOCAINE HCL 1 % IJ SOLN
INTRAMUSCULAR | Status: AC | PRN
  Administered 2017-04-06: 10 mL via INTRADERMAL

## 2017-04-06 MED ORDER — FENTANYL CITRATE (PF) 100 MCG/2ML IJ SOLN
INTRAMUSCULAR | Status: AC | PRN
  Administered 2017-04-06: 50 ug via INTRAVENOUS
  Administered 2017-04-06 (×2): 25 ug via INTRAVENOUS

## 2017-04-06 MED ORDER — LIDOCAINE-PRILOCAINE 2.5-2.5 % EX CREA: TOPICAL_CREAM | CUTANEOUS | 3 refills | Status: DC

## 2017-04-06 MED ORDER — FLUMAZENIL 0.5 MG/5ML IV SOLN
INTRAVENOUS | Status: AC
  Filled 2017-04-06: qty 5

## 2017-04-06 MED ORDER — CEFAZOLIN SODIUM-DEXTROSE 2-4 GM/100ML-% IV SOLN
INTRAVENOUS | Status: AC
  Administered 2017-04-06: 2000 mg
  Filled 2017-04-06: qty 100

## 2017-04-06 MED ORDER — MIDAZOLAM HCL 2 MG/2ML IJ SOLN
INTRAMUSCULAR | Status: AC
  Filled 2017-04-06: qty 4

## 2017-04-06 MED ORDER — NALOXONE HCL 0.4 MG/ML IJ SOLN
INTRAMUSCULAR | Status: AC
  Filled 2017-04-06: qty 1

## 2017-04-06 MED ORDER — HEPARIN SOD (PORK) LOCK FLUSH 100 UNIT/ML IV SOLN
INTRAVENOUS | Status: AC
  Filled 2017-04-06: qty 5

## 2017-04-06 MED ORDER — ONDANSETRON HCL 8 MG PO TABS: 8.0000 mg | ORAL_TABLET | Freq: Two times a day (BID) | ORAL | 1 refills | Status: DC | PRN

## 2017-04-06 MED ORDER — LORAZEPAM 0.5 MG PO TABS: 0.5000 mg | ORAL_TABLET | Freq: Four times a day (QID) | ORAL | 0 refills | Status: DC | PRN

## 2017-04-06 NOTE — Consult Note (Signed)
Chief Complaint: Patient was seen in consultation today for Port-A-Cath placement  Referring Physician(s): Ennever,Peter R  Supervising Physician: Corrie Mckusick  Patient Status: [facility] - Out-pt  History of Present Illness: Harry Hinton is a 83 y.o. male with history of locally advanced stage IIIB left lung squamous cell carcinoma who presents today for Port-A-Cath placement for chemotherapy.  Past Medical History:  Diagnosis Date  . Counseling regarding goals of care 03/26/2017  . Hearing loss   . Sinus problem     Past Surgical History:  Procedure Laterality Date  . EYE SURGERY     bilateral cataracts in approx. 2016  . TONSILLECTOMY     age 66  . VASECTOMY    . VIDEO BRONCHOSCOPY WITH ENDOBRONCHIAL ULTRASOUND  03/11/2017   Procedure: VIDEO BRONCHOSCOPY WITH ENDOBRONCHIAL ULTRASOUND;  Surgeon: Collene Gobble, MD;  Location: [facility] OR;  Service: Thoracic;;    Allergies: No known allergies  Medications: Prior to Admission medications   Medication Sig Start Date End Date Taking? Authorizing Provider  aspirin EC 81 MG tablet Take 81 mg by mouth daily.   Yes [provider]  carvedilol (COREG) 3.125 MG tablet Take 1 tablet (3.125 mg total) by mouth 2 (two) times daily with a meal. 03/12/17  Yes Mikhail, Clayton, DO  Multiple Vitamins-Minerals (CENTRUM SILVER 50+MEN) TABS Take 1 tablet by mouth daily.   Yes [provider]  vitamin E 200 UNIT capsule Take 200 Units by mouth daily.   Yes [provider]  amLODipine (NORVASC) 10 MG tablet Take 1 tablet (10 mg total) by mouth daily. 03/13/17   Mikhail, Velta Addison, DO  atorvastatin (LIPITOR) 10 MG tablet Take 1 tablet (10 mg total) by mouth daily. 03/12/17 04/11/17  Cristal Ford, DO  lidocaine-prilocaine (EMLA) cream Apply to affected area once 04/06/17   Volanda Napoleon, MD  LORazepam (ATIVAN) 0.5 MG tablet Take 1 tablet (0.5 mg total) by mouth every 6 (six) hours as needed (Nausea or vomiting). 04/06/17    Volanda Napoleon, MD  ondansetron (ZOFRAN) 8 MG tablet Take 1 tablet (8 mg total) by mouth 2 (two) times daily as needed for refractory nausea / vomiting. Start on day 3 after carboplatin chemo. 04/06/17   Volanda Napoleon, MD  prochlorperazine (COMPAZINE) 10 MG tablet Take 1 tablet (10 mg total) by mouth every 6 (six) hours as needed (Nausea or vomiting). 04/06/17   Volanda Napoleon, MD     History reviewed. No pertinent family history.  Social History   Social History  . Marital status: Married    Spouse name: N/A  . Number of children: N/A  . Years of education: N/A   Social History Main Topics  . Smoking status: Former Smoker    Types: Cigarettes    Quit date: 03/10/1987  . Smokeless tobacco: Never Used  . Alcohol use Yes     Comment: occasional beer or occasional wine  . Drug use: No  . Sexual activity: Not Asked   Other Topics Concern  . None   Social History Narrative  . None     Review of Systems denies fever, headache, chest pain, dyspnea, abdominal pain, nausea, vomiting or abnormal bleeding. He does have some posterior left shoulder discomfort, and occasional cough.  Vital Signs: BP (!) 186/92   Pulse 75   Temp 97.5 F (36.4 C) (Oral)   Resp 18   SpO2 100%   Physical Exam awake, alert. Chest with distant breath sounds bilaterally. Heart with normal  rate, occasional ectopy noted. Abdomen soft, positive bowel sounds, nontender. Lower extremities with 1+ pretibial edema bilaterally.  Mallampati Score:     Imaging: Nm Pet Image Initial (pi) Skull Base To Thigh  Result Date: 03/23/2017 CLINICAL DATA:  Initial treatment strategy for Lung cancer. EXAM: NUCLEAR MEDICINE PET SKULL BASE TO THIGH TECHNIQUE: 9.1 mCi F-18 FDG was injected intravenously. Full-ring PET imaging was performed from the skull base to thigh after the radiotracer. CT data was obtained and used for attenuation correction and anatomic localization. FASTING BLOOD GLUCOSE:  Value: 114 mg/dl  COMPARISON:  5/28/ 18. FINDINGS: NECK No hypermetabolic lymph nodes in the neck. CHEST Central right hilar lung mass measures 5.9 cm and has un SUV max equal to 16.4 cm. Left paratracheal node measures 8 mm and has mild FDG uptake within SUV max equal to 3.3. SUV max associated with the sub- carinal lymph node is equal to 2.19 Advanced changes of centrilobular and paraseptal emphysema. Focal subpleural soft tissue thickening involving the posteromedial left lung adjacent to the descending thoracic aorta has an SUV max equal to 4.7. This measures approximately 5.7 x 2.3 cm, image 62 of series 4. ABDOMEN/PELVIS No abnormal hypermetabolic activity within the liver, adrenal glands, pancreas, or spleen. Aortic atherosclerosis. Infrarenal abdominal aorta measures 2.6 cm, image 128 of series 4. No hypermetabolic lymph nodes in the abdomen or pelvis. Small stones are noted within the urinary bladder. SKELETON No focal hypermetabolic activity to suggest skeletal metastasis. IMPRESSION: 1. There is intense FDG uptake associated with the central left lung perihilar mass compatible with primary bronchogenic carcinoma. 2. Posteromedial subpleural increased soft tissue within the left lung exhibits mild increased FDG uptake within SUV max equal to 4.7. 3. Mild FDG uptake associated with subcentimeter left parent paratracheal lymph node. No contralateral hypermetabolic mediastinal or hilar lymph nodes and no evidence for distant metastatic disease. 4. Aortic Atherosclerosis (ICD10-I70.0) and Emphysema (ICD10-J43.9). 5. Ectatic abdominal aorta at risk for aneurysm development. Recommend followup by ultrasound in 5 years. This recommendation follows ACR consensus guidelines: White Paper of the ACR Incidental Findings Committee II on Vascular Findings. J Am Coll Radiol 2013; 10:789-794. Electronically Signed   By: Kerby Moors M.D.   On: 03/23/2017 09:29   US Aorta  Result Date: 03/09/2017 CLINICAL DATA:  Assess aortic  dissection and mural thrombus. History of chest mass. EXAM: ULTRASOUND OF ABDOMINAL AORTA TECHNIQUE: Ultrasound examination of the abdominal aorta was performed to evaluate for abdominal aortic aneurysm. COMPARISON:  CT angiogram of the chest, abdomen and pelvis Mar 09, 2017 at 0503 hours FINDINGS: Abdominal aortic measurements as follows: Maximum aortic dimension: 2.6 x 2.5 cm (as seen on prior CT). Echogenic calcific atherosclerosis. RIGHT Common iliac: Maximal dimension 1.1 cm. LEFT Common iliac colon maximum dimension 1.1 cm. Normal appearance of the kidneys. Technologist reports generally limited examination due to bowel gas. IMPRESSION: Mildly ectatic infrarenal aorta without aneurysm. Please note, small dissections could be obscured by bowel gas. Electronically Signed   By: Elon Alas M.D.   On: 03/09/2017 22:49    Labs:  CBC:  Recent Labs  03/10/17 0545 03/11/17 0607 03/26/17 1102 04/06/17 1233  WBC 10.5 8.9 9.5 11.5*  HGB 12.0* 11.8* 12.0* 12.7*  HCT 36.3* 35.9* 36.1* 37.6*  PLT 294 262 283 297    COAGS:  Recent Labs  03/09/17 0928 04/06/17 1233  INR 1.03 0.98    BMP:  Recent Labs  03/09/17 0928 03/10/17 0545 03/11/17 0607 03/12/17 1021 03/26/17 1102 04/06/17 1233  NA  135 133* 136  --  133 132*  K 4.4 5.0 5.2* 4.5 5.1* 4.5  CL 101 99* 102  --  95* 96*  CO2 28 26 28   --  31 26  GLUCOSE 116* 119* 118*  --  97 99  BUN 20 23* 17  --  18 23*  CALCIUM 8.4* 8.6* 8.7*  --  9.3 9.0  CREATININE 1.18 1.37* 1.14  --  1.2 1.20  GFRNONAA 56* 47* 58*  --   --  55*  GFRAA >60 54* >60  --   --  >60    LIVER FUNCTION TESTS:  Recent Labs  03/09/17 0928 03/11/17 0607 03/26/17 1102  BILITOT 0.3 0.4 0.60  AST 19 18 24   ALT 14* 12* 24  ALKPHOS 88 77 92*  PROT 8.0 7.8 8.3*  ALBUMIN 3.1* 2.9* 2.9*    TUMOR MARKERS: No results for input(s): AFPTM, CEA, CA199, CHROMGRNA in the last 8760 hours.  Assessment and Plan: 83 y.o. male with history of locally  advanced stage IIIB left lung squamous cell carcinoma who presents today for Port-A-Cath placement for chemotherapy.Risks and benefits discussed with the patient/wife including, but not limited to bleeding, infection, pneumothorax, or fibrin sheath development and need for additional procedures.All of the patient's questions were answered, patient is agreeable to proceed.Consent signed and in chart.     Thank you for this interesting consult.  I greatly enjoyed meeting Harry Hinton and look forward to participating in their care.  A copy of this report was sent to the requesting provider on this date.  Electronically Signed: D. Rowe Robert, PA-C 04/06/2017, 1:51 PM   I spent a total of 20 minutes  in face to face in clinical consultation, greater than 50% of which was counseling/coordinating care for Port-A-Cath placement

## 2017-04-06 NOTE — Procedures (Signed)
Interventional Radiology Procedure Note  Procedure: Placement of a right IJ approach single lumen PowerPort.  Tip is positioned at the superior cavoatrial junction and catheter is ready for immediate use.  Complications: None Recommendations:  - Ok to shower tomorrow - Do not submerge for 7 days - Routine line care   Signed,  Vidalia Serpas S. Page Pucciarelli, DO   

## 2017-04-06 NOTE — Discharge Instructions (Signed)
Moderate Conscious Sedation, Adult, Care After These instructions provide you with information about caring for yourself after your procedure. Your health care provider may also give you more specific instructions. Your treatment has been planned according to current medical practices, but problems sometimes occur. Call your health care provider if you have any problems or questions after your procedure. What can I expect after the procedure? After your procedure, it is common:  To feel sleepy for several hours.  To feel clumsy and have poor balance for several hours.  To have poor judgment for several hours.  To vomit if you eat too soon.  Follow these instructions at home: For at least 24 hours after the procedure:   Do not: ? Participate in activities where you could fall or become injured. ? Drive. ? Use heavy machinery. ? Drink alcohol. ? Take sleeping pills or medicines that cause drowsiness. ? Make important decisions or sign legal documents. ? Take care of children on your own.  Rest. Eating and drinking  Follow the diet recommended by your health care provider.  If you vomit: ? Drink water, juice, or soup when you can drink without vomiting. ? Make sure you have little or no nausea before eating solid foods. General instructions  Have a responsible adult stay with you until you are awake and alert.  Take over-the-counter and prescription medicines only as told by your health care provider.  If you smoke, do not smoke without supervision.  Keep all follow-up visits as told by your health care provider. This is important. Contact a health care provider if:  You keep feeling nauseous or you keep vomiting.  You feel light-headed.  You develop a rash.  You have a fever. Get help right away if:  You have trouble breathing. This information is not intended to replace advice given to you by your health care provider. Make sure you discuss any questions you have  with your health care provider. Document Released: 07/20/2013 Document Revised: 03/03/2016 Document Reviewed: 01/19/2016 Elsevier Interactive Patient Education  2018 [place] Insertion, Care After This sheet gives you information about how to care for yourself after your procedure. Your health care provider may also give you more specific instructions. If you have problems or questions, contact your health care provider. What can I expect after the procedure? After your procedure, it is common to have:  Discomfort at the port insertion site.  Bruising on the skin over the port. This should improve over 3-4 days.  Follow these instructions at home: [facility] care  After your port is placed, you will get a manufacturer's information card. The card has information about your port. Keep this card with you at all times.  Take care of the port as told by your health care provider. Ask your health care provider if you or a family member can get training for taking care of the port at home. A home health care nurse may also take care of the port.  Make sure to remember what type of port you have. Incision care  Follow instructions from your health care provider about how to take care of your port insertion site. Make sure you: ? Wash your hands with soap and water before you change your bandage (dressing). If soap and water are not available, use hand sanitizer. ? Change your dressing as told by your health care provider. ? Leave stitches (sutures), skin glue, or adhesive strips in place. These skin closures may  need to stay in place for 2 weeks or longer. If adhesive strip edges start to loosen and curl up, you may trim the loose edges. Do not remove adhesive strips completely unless your health care provider tells you to do that.  Check your port insertion site every day for signs of infection. Check for: ? More redness, swelling, or pain. ? More fluid or  blood. ? Warmth. ? Pus or a bad smell. General instructions  Do not take baths, swim, or use a hot tub until your health care provider approves.  Do not lift anything that is heavier than 10 lb (4.5 kg) for a week, or as told by your health care provider.  Ask your health care provider when it is okay to: ? Return to work or school. ? Resume usual physical activities or sports.  Do not drive for 24 hours if you were given a medicine to help you relax (sedative).  Take over-the-counter and prescription medicines only as told by your health care provider.  Wear a medical alert bracelet in case of an emergency. This will tell any health care providers that you have a port.  Keep all follow-up visits as told by your health care provider. This is important. Contact a health care provider if:  You cannot flush your port with saline as directed, or you cannot draw blood from the port.  You have a fever or chills.  You have more redness, swelling, or pain around your port insertion site.  You have more fluid or blood coming from your port insertion site.  Your port insertion site feels warm to the touch.  You have pus or a bad smell coming from the port insertion site. Get help right away if:  You have chest pain or shortness of breath.  You have bleeding from your port that you cannot control. Summary  Take care of the port as told by your health care provider.  Change your dressing as told by your health care provider.  Keep all follow-up visits as told by your health care provider. This information is not intended to replace advice given to you by your health care provider. Make sure you discuss any questions you have with your health care provider. Document Released: 07/20/2013 Document Revised: 08/20/2016 Document Reviewed: 08/20/2016 Elsevier Interactive Patient Education  2017 [employer].

## 2017-04-07 ENCOUNTER — Ambulatory Visit: Payer: Medicare Other

## 2017-04-07 ENCOUNTER — Other Ambulatory Visit (HOSPITAL_BASED_OUTPATIENT_CLINIC_OR_DEPARTMENT_OTHER): Payer: Medicare Other

## 2017-04-07 ENCOUNTER — Ambulatory Visit (HOSPITAL_BASED_OUTPATIENT_CLINIC_OR_DEPARTMENT_OTHER): Payer: Medicare Other

## 2017-04-07 VITALS — BP 147/72 | HR 65 | Temp 98.3°F | Resp 16

## 2017-04-07 DIAGNOSIS — C3492 Malignant neoplasm of unspecified part of left bronchus or lung: Secondary | ICD-10-CM | POA: Diagnosis not present

## 2017-04-07 DIAGNOSIS — C3402 Malignant neoplasm of left main bronchus: Secondary | ICD-10-CM

## 2017-04-07 DIAGNOSIS — Z7189 Other specified counseling: Secondary | ICD-10-CM

## 2017-04-07 DIAGNOSIS — Z5111 Encounter for antineoplastic chemotherapy: Secondary | ICD-10-CM

## 2017-04-07 DIAGNOSIS — D500 Iron deficiency anemia secondary to blood loss (chronic): Secondary | ICD-10-CM

## 2017-04-07 LAB — CMP (CANCER CENTER ONLY)
ALBUMIN: 2.8 g/dL — AB (ref 3.3–5.5)
ALT(SGPT): 19 U/L (ref 10–47)
AST: 21 U/L (ref 11–38)
Alkaline Phosphatase: 94 U/L — ABNORMAL HIGH (ref 26–84)
BUN, Bld: 21 mg/dL (ref 7–22)
CHLORIDE: 95 meq/L — AB (ref 98–108)
CO2: 29 meq/L (ref 18–33)
CREATININE: 1 mg/dL (ref 0.6–1.2)
Calcium: 8.7 mg/dL (ref 8.0–10.3)
GLUCOSE: 121 mg/dL — AB (ref 73–118)
Potassium: 4.2 mEq/L (ref 3.3–4.7)
SODIUM: 131 meq/L (ref 128–145)
Total Bilirubin: 0.5 mg/dl (ref 0.20–1.60)
Total Protein: 7.8 g/dL (ref 6.4–8.1)

## 2017-04-07 LAB — CBC WITH DIFFERENTIAL (CANCER CENTER ONLY)
BASO#: 0.1 10*3/uL (ref 0.0–0.2)
BASO%: 0.5 % (ref 0.0–2.0)
EOS ABS: 0.6 10*3/uL — AB (ref 0.0–0.5)
EOS%: 6.6 % (ref 0.0–7.0)
HCT: 33.6 % — ABNORMAL LOW (ref 38.7–49.9)
HEMOGLOBIN: 11.1 g/dL — AB (ref 13.0–17.1)
LYMPH#: 2.7 10*3/uL (ref 0.9–3.3)
LYMPH%: 29.2 % (ref 14.0–48.0)
MCH: 28.6 pg (ref 28.0–33.4)
MCHC: 33 g/dL (ref 32.0–35.9)
MCV: 87 fL (ref 82–98)
MONO#: 0.8 10*3/uL (ref 0.1–0.9)
MONO%: 9.2 % (ref 0.0–13.0)
NEUT#: 5 10*3/uL (ref 1.5–6.5)
NEUT%: 54.5 % (ref 40.0–80.0)
PLATELETS: 256 10*3/uL (ref 145–400)
RBC: 3.88 10*6/uL — ABNORMAL LOW (ref 4.20–5.70)
RDW: 14.3 % (ref 11.1–15.7)
WBC: 9.2 10*3/uL (ref 4.0–10.0)

## 2017-04-07 MED ORDER — HEPARIN SOD (PORK) LOCK FLUSH 100 UNIT/ML IV SOLN
500.0000 [IU] | Freq: Once | INTRAVENOUS | Status: AC | PRN
  Administered 2017-04-07: 500 [IU]
  Filled 2017-04-07: qty 5

## 2017-04-07 MED ORDER — PALONOSETRON HCL INJECTION 0.25 MG/5ML
INTRAVENOUS | Status: AC
  Filled 2017-04-07: qty 5

## 2017-04-07 MED ORDER — DEXAMETHASONE SODIUM PHOSPHATE 10 MG/ML IJ SOLN
10.0000 mg | Freq: Once | INTRAMUSCULAR | Status: AC
  Administered 2017-04-07: 10 mg via INTRAVENOUS

## 2017-04-07 MED ORDER — SODIUM CHLORIDE 0.9 % IV SOLN
350.0000 mg | Freq: Once | INTRAVENOUS | Status: AC
  Administered 2017-04-07: 350 mg via INTRAVENOUS
  Filled 2017-04-07: qty 35

## 2017-04-07 MED ORDER — DEXAMETHASONE SODIUM PHOSPHATE 10 MG/ML IJ SOLN
INTRAMUSCULAR | Status: AC
  Filled 2017-04-07: qty 1

## 2017-04-07 MED ORDER — PALONOSETRON HCL INJECTION 0.25 MG/5ML
0.2500 mg | Freq: Once | INTRAVENOUS | Status: AC
  Administered 2017-04-07: 0.25 mg via INTRAVENOUS

## 2017-04-07 MED ORDER — SODIUM CHLORIDE 0.9 % IV SOLN
60.0000 mg/m2 | Freq: Once | INTRAVENOUS | Status: AC
  Administered 2017-04-07: 120 mg via INTRAVENOUS
  Filled 2017-04-07: qty 6

## 2017-04-07 MED ORDER — SODIUM CHLORIDE 0.9 % IV SOLN
Freq: Once | INTRAVENOUS | Status: AC
  Administered 2017-04-07: 12:00:00 via INTRAVENOUS

## 2017-04-07 MED ORDER — SODIUM CHLORIDE 0.9% FLUSH
10.0000 mL | INTRAVENOUS | Status: DC | PRN
  Administered 2017-04-07: 10 mL
  Filled 2017-04-07: qty 10

## 2017-04-07 NOTE — Patient Instructions (Signed)
Implanted Port Home Guide An implanted port is a type of central line that is placed under the skin. Central lines are used to provide IV access when treatment or nutrition needs to be given through a person's veins. Implanted ports are used for long-term IV access. An implanted port may be placed because:  You need IV medicine that would be irritating to the small veins in your hands or arms.  You need long-term IV medicines, such as antibiotics.  You need IV nutrition for a long period.  You need frequent blood draws for lab tests.  You need dialysis.  Implanted ports are usually placed in the chest area, but they can also be placed in the upper arm, the abdomen, or the leg. An implanted port has two main parts:  Reservoir. The reservoir is round and will appear as a small, raised area under your skin. The reservoir is the part where a needle is inserted to give medicines or draw blood.  Catheter. The catheter is a thin, flexible tube that extends from the reservoir. The catheter is placed into a large vein. Medicine that is inserted into the reservoir goes into the catheter and then into the vein.  How will I care for my incision site? Do not get the incision site wet. Bathe or shower as directed by your health care provider. How is my port accessed? Special steps must be taken to access the port:  Before the port is accessed, a numbing cream can be placed on the skin. This helps numb the skin over the port site.  Your health care provider uses a sterile technique to access the port. ? Your health care provider must put on a mask and sterile gloves. ? The skin over your port is cleaned carefully with an antiseptic and allowed to dry. ? The port is gently pinched between sterile gloves, and a needle is inserted into the port.  Only "non-coring" port needles should be used to access the port. Once the port is accessed, a blood return should be checked. This helps ensure that the port  is in the vein and is not clogged.  If your port needs to remain accessed for a constant infusion, a clear (transparent) bandage will be placed over the needle site. The bandage and needle will need to be changed every week, or as directed by your health care provider.  Keep the bandage covering the needle clean and dry. Do not get it wet. Follow your health care provider's instructions on how to take a shower or bath while the port is accessed.  If your port does not need to stay accessed, no bandage is needed over the port.  What is flushing? Flushing helps keep the port from getting clogged. Follow your health care provider's instructions on how and when to flush the port. Ports are usually flushed with saline solution or a medicine called heparin. The need for flushing will depend on how the port is used.  If the port is used for intermittent medicines or blood draws, the port will need to be flushed: ? After medicines have been given. ? After blood has been drawn. ? As part of routine maintenance.  If a constant infusion is running, the port may not need to be flushed.  How long will my port stay implanted? The port can stay in for as long as your health care provider thinks it is needed. When it is time for the port to come out, surgery will be   done to remove it. The procedure is similar to the one performed when the port was put in. When should I seek immediate medical care? When you have an implanted port, you should seek immediate medical care if:  You notice a bad smell coming from the incision site.  You have swelling, redness, or drainage at the incision site.  You have more swelling or pain at the port site or the surrounding area.  You have a fever that is not controlled with medicine.  This information is not intended to replace advice given to you by your health care provider. Make sure you discuss any questions you have with your health care provider. Document  Released: 09/29/2005 Document Revised: 03/06/2016 Document Reviewed: 06/06/2013 Elsevier Interactive Patient Education  2017 [employer]  

## 2017-04-07 NOTE — Patient Instructions (Signed)
Etoposide, VP-16 injection What is this medicine? ETOPOSIDE, VP-16 (e toe POE side) is a chemotherapy drug. It is used to treat testicular cancer, lung cancer, and other cancers. This medicine may be used for other purposes; ask your health care provider or pharmacist if you have questions. COMMON BRAND NAME(S): Etopophos, Toposar, VePesid What should I tell my health care provider before I take this medicine? They need to know if you have any of these conditions: -infection -kidney disease -liver disease -low blood counts, like low white cell, platelet, or red cell counts -an unusual or allergic reaction to etoposide, other medicines, foods, dyes, or preservatives -pregnant or trying to get pregnant -breast-feeding How should I use this medicine? This medicine is for infusion into a vein. It is administered in a hospital or clinic by a specially trained health care professional. Talk to your pediatrician regarding the use of this medicine in children. Special care may be needed. Overdosage: If you think you have taken too much of this medicine contact a poison control center or emergency room at once. NOTE: This medicine is only for you. Do not share this medicine with others. What if I miss a dose? It is important not to miss your dose. Call your doctor or health care professional if you are unable to keep an appointment. What may interact with this medicine? -aspirin -certain medications for seizures like carbamazepine, phenobarbital, phenytoin, valproic acid -cyclosporine -levamisole -warfarin This list may not describe all possible interactions. Give your health care provider a list of all the medicines, herbs, non-prescription drugs, or dietary supplements you use. Also tell them if you smoke, drink alcohol, or use illegal drugs. Some items may interact with your medicine. What should I watch for while using this medicine? Visit your doctor for checks on your progress. This drug  may make you feel generally unwell. This is not uncommon, as chemotherapy can affect healthy cells as well as cancer cells. Report any side effects. Continue your course of treatment even though you feel ill unless your doctor tells you to stop. In some cases, you may be given additional medicines to help with side effects. Follow all directions for their use. Call your doctor or health care professional for advice if you get a fever, chills or sore throat, or other symptoms of a cold or flu. Do not treat yourself. This drug decreases your body's ability to fight infections. Try to avoid being around people who are sick. This medicine may increase your risk to bruise or bleed. Call your doctor or health care professional if you notice any unusual bleeding. Talk to your doctor about your risk of cancer. You may be more at risk for certain types of cancers if you take this medicine. Do not become pregnant while taking this medicine or for at least 6 months after stopping it. Women should inform their doctor if they wish to become pregnant or think they might be pregnant. Women of child-bearing potential will need to have a negative pregnancy test before starting this medicine. There is a potential for serious side effects to an unborn child. Talk to your health care professional or pharmacist for more information. Do not breast-feed an infant while taking this medicine. Men must use a latex condom during sexual contact with a woman while taking this medicine and for at least 4 months after stopping it. A latex condom is needed even if you have had a vasectomy. Contact your doctor right away if your partner becomes pregnant. Do   not donate sperm while taking this medicine and for at least 4 months after you stop taking this medicine. Men should inform their doctors if they wish to father a child. This medicine may lower sperm counts. What side effects may I notice from receiving this medicine? Side effects that  you should report to your doctor or health care professional as soon as possible: -allergic reactions like skin rash, itching or hives, swelling of the face, lips, or tongue -low blood counts - this medicine may decrease the number of white blood cells, red blood cells and platelets. You may be at increased risk for infections and bleeding. -signs of infection - fever or chills, cough, sore throat, pain or difficulty passing urine -signs of decreased platelets or bleeding - bruising, pinpoint red spots on the skin, black, tarry stools, blood in the urine -signs of decreased red blood cells - unusually weak or tired, fainting spells, lightheadedness -breathing problems -changes in vision -mouth or throat sores or ulcers -pain, redness, swelling or irritation at the injection site -pain, tingling, numbness in the hands or feet -redness, blistering, peeling or loosening of the skin, including inside the mouth -seizures -vomiting Side effects that usually do not require medical attention (report to your doctor or health care professional if they continue or are bothersome): -diarrhea -hair loss -loss of appetite -nausea -stomach pain This list may not describe all possible side effects. Call your doctor for medical advice about side effects. You may report side effects to FDA at 1-800-FDA-1088. Where should I keep my medicine? This drug is given in a hospital or clinic and will not be stored at home. NOTE: This sheet is a summary. It may not cover all possible information. If you have questions about this medicine, talk to your doctor, pharmacist, or health care provider.  2018 Elsevier/Gold Standard (2015-09-21 11:53:23) Carboplatin injection What is this medicine? CARBOPLATIN (KAR boe pla tin) is a chemotherapy drug. It targets fast dividing cells, like cancer cells, and causes these cells to die. This medicine is used to treat ovarian cancer and many other cancers. This medicine may be  used for other purposes; ask your health care provider or pharmacist if you have questions. COMMON BRAND NAME(S): Paraplatin What should I tell my health care provider before I take this medicine? They need to know if you have any of these conditions: -blood disorders -hearing problems -kidney disease -recent or ongoing radiation therapy -an unusual or allergic reaction to carboplatin, cisplatin, other chemotherapy, other medicines, foods, dyes, or preservatives -pregnant or trying to get pregnant -breast-feeding How should I use this medicine? This drug is usually given as an infusion into a vein. It is administered in a hospital or clinic by a specially trained health care professional. Talk to your pediatrician regarding the use of this medicine in children. Special care may be needed. Overdosage: If you think you have taken too much of this medicine contact a poison control center or emergency room at once. NOTE: This medicine is only for you. Do not share this medicine with others. What if I miss a dose? It is important not to miss a dose. Call your doctor or health care professional if you are unable to keep an appointment. What may interact with this medicine? -medicines for seizures -medicines to increase blood counts like filgrastim, pegfilgrastim, sargramostim -some antibiotics like amikacin, gentamicin, neomycin, streptomycin, tobramycin -vaccines Talk to your doctor or health care professional before taking any of these medicines: -acetaminophen -aspirin -ibuprofen -ketoprofen -naproxen  This list may not describe all possible interactions. Give your health care provider a list of all the medicines, herbs, non-prescription drugs, or dietary supplements you use. Also tell them if you smoke, drink alcohol, or use illegal drugs. Some items may interact with your medicine. What should I watch for while using this medicine? Your condition will be monitored carefully while you are  receiving this medicine. You will need important blood work done while you are taking this medicine. This drug may make you feel generally unwell. This is not uncommon, as chemotherapy can affect healthy cells as well as cancer cells. Report any side effects. Continue your course of treatment even though you feel ill unless your doctor tells you to stop. In some cases, you may be given additional medicines to help with side effects. Follow all directions for their use. Call your doctor or health care professional for advice if you get a fever, chills or sore throat, or other symptoms of a cold or flu. Do not treat yourself. This drug decreases your body's ability to fight infections. Try to avoid being around people who are sick. This medicine may increase your risk to bruise or bleed. Call your doctor or health care professional if you notice any unusual bleeding. Be careful brushing and flossing your teeth or using a toothpick because you may get an infection or bleed more easily. If you have any dental work done, tell your dentist you are receiving this medicine. Avoid taking products that contain aspirin, acetaminophen, ibuprofen, naproxen, or ketoprofen unless instructed by your doctor. These medicines may hide a fever. Do not become pregnant while taking this medicine. Women should inform their doctor if they wish to become pregnant or think they might be pregnant. There is a potential for serious side effects to an unborn child. Talk to your health care professional or pharmacist for more information. Do not breast-feed an infant while taking this medicine. What side effects may I notice from receiving this medicine? Side effects that you should report to your doctor or health care professional as soon as possible: -allergic reactions like skin rash, itching or hives, swelling of the face, lips, or tongue -signs of infection - fever or chills, cough, sore throat, pain or difficulty passing  urine -signs of decreased platelets or bleeding - bruising, pinpoint red spots on the skin, black, tarry stools, nosebleeds -signs of decreased red blood cells - unusually weak or tired, fainting spells, lightheadedness -breathing problems -changes in hearing -changes in vision -chest pain -high blood pressure -low blood counts - This drug may decrease the number of white blood cells, red blood cells and platelets. You may be at increased risk for infections and bleeding. -nausea and vomiting -pain, swelling, redness or irritation at the injection site -pain, tingling, numbness in the hands or feet -problems with balance, talking, walking -trouble passing urine or change in the amount of urine Side effects that usually do not require medical attention (report to your doctor or health care professional if they continue or are bothersome): -hair loss -loss of appetite -metallic taste in the mouth or changes in taste This list may not describe all possible side effects. Call your doctor for medical advice about side effects. You may report side effects to FDA at 1-800-FDA-1088. Where should I keep my medicine? This drug is given in a hospital or clinic and will not be stored at home. NOTE: This sheet is a summary. It may not cover all possible information. If you have  questions about this medicine, talk to your doctor, pharmacist, or health care provider.  2018 Elsevier/Gold Standard (2008-01-04 14:38:05)

## 2017-04-08 ENCOUNTER — Ambulatory Visit (HOSPITAL_BASED_OUTPATIENT_CLINIC_OR_DEPARTMENT_OTHER): Payer: Medicare Other

## 2017-04-08 VITALS — BP 131/59 | HR 79 | Temp 98.1°F | Resp 20

## 2017-04-08 DIAGNOSIS — Z5111 Encounter for antineoplastic chemotherapy: Secondary | ICD-10-CM

## 2017-04-08 DIAGNOSIS — Z510 Encounter for antineoplastic radiation therapy: Secondary | ICD-10-CM | POA: Diagnosis not present

## 2017-04-08 DIAGNOSIS — C3492 Malignant neoplasm of unspecified part of left bronchus or lung: Secondary | ICD-10-CM | POA: Diagnosis not present

## 2017-04-08 DIAGNOSIS — C3482 Malignant neoplasm of overlapping sites of left bronchus and lung: Secondary | ICD-10-CM | POA: Diagnosis not present

## 2017-04-08 DIAGNOSIS — C3402 Malignant neoplasm of left main bronchus: Secondary | ICD-10-CM

## 2017-04-08 MED ORDER — HEPARIN SOD (PORK) LOCK FLUSH 100 UNIT/ML IV SOLN
500.0000 [IU] | Freq: Once | INTRAVENOUS | Status: AC | PRN
  Administered 2017-04-08: 500 [IU]
  Filled 2017-04-08: qty 5

## 2017-04-08 MED ORDER — SODIUM CHLORIDE 0.9 % IV SOLN
Freq: Once | INTRAVENOUS | Status: AC
  Administered 2017-04-08: 11:00:00 via INTRAVENOUS

## 2017-04-08 MED ORDER — ETOPOSIDE CHEMO INJECTION 1 GM/50ML
60.0000 mg/m2 | Freq: Once | INTRAVENOUS | Status: AC
  Administered 2017-04-08: 120 mg via INTRAVENOUS
  Filled 2017-04-08: qty 6

## 2017-04-08 MED ORDER — SODIUM CHLORIDE 0.9% FLUSH
10.0000 mL | INTRAVENOUS | Status: DC | PRN
  Administered 2017-04-08: 10 mL
  Filled 2017-04-08: qty 10

## 2017-04-08 MED ORDER — DEXAMETHASONE SODIUM PHOSPHATE 10 MG/ML IJ SOLN
10.0000 mg | Freq: Once | INTRAMUSCULAR | Status: AC
  Administered 2017-04-08: 10 mg via INTRAVENOUS

## 2017-04-08 MED ORDER — DEXAMETHASONE SODIUM PHOSPHATE 10 MG/ML IJ SOLN
INTRAMUSCULAR | Status: AC
  Filled 2017-04-08: qty 1

## 2017-04-08 NOTE — Patient Instructions (Signed)
Etoposide, VP-16 injection What is this medicine? ETOPOSIDE, VP-16 (e toe POE side) is a chemotherapy drug. It is used to treat testicular cancer, lung cancer, and other cancers. This medicine may be used for other purposes; ask your health care provider or pharmacist if you have questions. COMMON BRAND NAME(S): Etopophos, Toposar, VePesid What should I tell my health care provider before I take this medicine? They need to know if you have any of these conditions: -infection -kidney disease -liver disease -low blood counts, like low white cell, platelet, or red cell counts -an unusual or allergic reaction to etoposide, other medicines, foods, dyes, or preservatives -pregnant or trying to get pregnant -breast-feeding How should I use this medicine? This medicine is for infusion into a vein. It is administered in a hospital or clinic by a specially trained health care professional. Talk to your pediatrician regarding the use of this medicine in children. Special care may be needed. Overdosage: If you think you have taken too much of this medicine contact a poison control center or emergency room at once. NOTE: This medicine is only for you. Do not share this medicine with others. What if I miss a dose? It is important not to miss your dose. Call your doctor or health care professional if you are unable to keep an appointment. What may interact with this medicine? -aspirin -certain medications for seizures like carbamazepine, phenobarbital, phenytoin, valproic acid -cyclosporine -levamisole -warfarin This list may not describe all possible interactions. Give your health care provider a list of all the medicines, herbs, non-prescription drugs, or dietary supplements you use. Also tell them if you smoke, drink alcohol, or use illegal drugs. Some items may interact with your medicine. What should I watch for while using this medicine? Visit your doctor for checks on your progress. This drug  may make you feel generally unwell. This is not uncommon, as chemotherapy can affect healthy cells as well as cancer cells. Report any side effects. Continue your course of treatment even though you feel ill unless your doctor tells you to stop. In some cases, you may be given additional medicines to help with side effects. Follow all directions for their use. Call your doctor or health care professional for advice if you get a fever, chills or sore throat, or other symptoms of a cold or flu. Do not treat yourself. This drug decreases your body's ability to fight infections. Try to avoid being around people who are sick. This medicine may increase your risk to bruise or bleed. Call your doctor or health care professional if you notice any unusual bleeding. Talk to your doctor about your risk of cancer. You may be more at risk for certain types of cancers if you take this medicine. Do not become pregnant while taking this medicine or for at least 6 months after stopping it. Women should inform their doctor if they wish to become pregnant or think they might be pregnant. Women of child-bearing potential will need to have a negative pregnancy test before starting this medicine. There is a potential for serious side effects to an unborn child. Talk to your health care professional or pharmacist for more information. Do not breast-feed an infant while taking this medicine. Men must use a latex condom during sexual contact with a woman while taking this medicine and for at least 4 months after stopping it. A latex condom is needed even if you have had a vasectomy. Contact your doctor right away if your partner becomes pregnant. Do   not donate sperm while taking this medicine and for at least 4 months after you stop taking this medicine. Men should inform their doctors if they wish to father a child. This medicine may lower sperm counts. What side effects may I notice from receiving this medicine? Side effects that  you should report to your doctor or health care professional as soon as possible: -allergic reactions like skin rash, itching or hives, swelling of the face, lips, or tongue -low blood counts - this medicine may decrease the number of white blood cells, red blood cells and platelets. You may be at increased risk for infections and bleeding. -signs of infection - fever or chills, cough, sore throat, pain or difficulty passing urine -signs of decreased platelets or bleeding - bruising, pinpoint red spots on the skin, black, tarry stools, blood in the urine -signs of decreased red blood cells - unusually weak or tired, fainting spells, lightheadedness -breathing problems -changes in vision -mouth or throat sores or ulcers -pain, redness, swelling or irritation at the injection site -pain, tingling, numbness in the hands or feet -redness, blistering, peeling or loosening of the skin, including inside the mouth -seizures -vomiting Side effects that usually do not require medical attention (report to your doctor or health care professional if they continue or are bothersome): -diarrhea -hair loss -loss of appetite -nausea -stomach pain This list may not describe all possible side effects. Call your doctor for medical advice about side effects. You may report side effects to FDA at 1-800-FDA-1088. Where should I keep my medicine? This drug is given in a hospital or clinic and will not be stored at home. NOTE: This sheet is a summary. It may not cover all possible information. If you have questions about this medicine, talk to your doctor, pharmacist, or health care provider.  2018 Elsevier/Gold Standard (2015-09-21 11:53:23)  

## 2017-04-09 ENCOUNTER — Ambulatory Visit (HOSPITAL_BASED_OUTPATIENT_CLINIC_OR_DEPARTMENT_OTHER): Payer: Medicare Other

## 2017-04-09 VITALS — BP 157/68 | HR 69 | Temp 97.9°F | Resp 18

## 2017-04-09 DIAGNOSIS — Z5111 Encounter for antineoplastic chemotherapy: Secondary | ICD-10-CM | POA: Diagnosis not present

## 2017-04-09 DIAGNOSIS — C3492 Malignant neoplasm of unspecified part of left bronchus or lung: Secondary | ICD-10-CM

## 2017-04-09 DIAGNOSIS — C3402 Malignant neoplasm of left main bronchus: Secondary | ICD-10-CM

## 2017-04-09 MED ORDER — DEXAMETHASONE SODIUM PHOSPHATE 10 MG/ML IJ SOLN
10.0000 mg | Freq: Once | INTRAMUSCULAR | Status: AC
  Administered 2017-04-09: 10 mg via INTRAVENOUS

## 2017-04-09 MED ORDER — HEPARIN SOD (PORK) LOCK FLUSH 100 UNIT/ML IV SOLN
250.0000 [IU] | Freq: Once | INTRAVENOUS | Status: DC | PRN
  Filled 2017-04-09: qty 5

## 2017-04-09 MED ORDER — SODIUM CHLORIDE 0.9 % IV SOLN
60.0000 mg/m2 | Freq: Once | INTRAVENOUS | Status: AC
  Administered 2017-04-09: 120 mg via INTRAVENOUS
  Filled 2017-04-09: qty 6

## 2017-04-09 MED ORDER — DEXAMETHASONE SODIUM PHOSPHATE 10 MG/ML IJ SOLN
INTRAMUSCULAR | Status: AC
  Filled 2017-04-09: qty 1

## 2017-04-09 MED ORDER — SODIUM CHLORIDE 0.9% FLUSH
10.0000 mL | INTRAVENOUS | Status: DC | PRN
  Administered 2017-04-09: 10 mL
  Filled 2017-04-09: qty 10

## 2017-04-09 MED ORDER — HEPARIN SOD (PORK) LOCK FLUSH 100 UNIT/ML IV SOLN
500.0000 [IU] | Freq: Once | INTRAVENOUS | Status: AC | PRN
  Administered 2017-04-09: 500 [IU]
  Filled 2017-04-09: qty 5

## 2017-04-09 MED ORDER — SODIUM CHLORIDE 0.9 % IV SOLN
Freq: Once | INTRAVENOUS | Status: AC
  Administered 2017-04-09: 13:00:00 via INTRAVENOUS

## 2017-04-09 NOTE — Patient Instructions (Signed)
Etoposide, VP-16 injection What is this medicine? ETOPOSIDE, VP-16 (e toe POE side) is a chemotherapy drug. It is used to treat testicular cancer, lung cancer, and other cancers. This medicine may be used for other purposes; ask your health care provider or pharmacist if you have questions. COMMON BRAND NAME(S): Etopophos, Toposar, VePesid What should I tell my health care provider before I take this medicine? They need to know if you have any of these conditions: -infection -kidney disease -liver disease -low blood counts, like low white cell, platelet, or red cell counts -an unusual or allergic reaction to etoposide, other medicines, foods, dyes, or preservatives -pregnant or trying to get pregnant -breast-feeding How should I use this medicine? This medicine is for infusion into a vein. It is administered in a hospital or clinic by a specially trained health care professional. Talk to your pediatrician regarding the use of this medicine in children. Special care may be needed. Overdosage: If you think you have taken too much of this medicine contact a poison control center or emergency room at once. NOTE: This medicine is only for you. Do not share this medicine with others. What if I miss a dose? It is important not to miss your dose. Call your doctor or health care professional if you are unable to keep an appointment. What may interact with this medicine? -aspirin -certain medications for seizures like carbamazepine, phenobarbital, phenytoin, valproic acid -cyclosporine -levamisole -warfarin This list may not describe all possible interactions. Give your health care provider a list of all the medicines, herbs, non-prescription drugs, or dietary supplements you use. Also tell them if you smoke, drink alcohol, or use illegal drugs. Some items may interact with your medicine. What should I watch for while using this medicine? Visit your doctor for checks on your progress. This drug  may make you feel generally unwell. This is not uncommon, as chemotherapy can affect healthy cells as well as cancer cells. Report any side effects. Continue your course of treatment even though you feel ill unless your doctor tells you to stop. In some cases, you may be given additional medicines to help with side effects. Follow all directions for their use. Call your doctor or health care professional for advice if you get a fever, chills or sore throat, or other symptoms of a cold or flu. Do not treat yourself. This drug decreases your body's ability to fight infections. Try to avoid being around people who are sick. This medicine may increase your risk to bruise or bleed. Call your doctor or health care professional if you notice any unusual bleeding. Talk to your doctor about your risk of cancer. You may be more at risk for certain types of cancers if you take this medicine. Do not become pregnant while taking this medicine or for at least 6 months after stopping it. Women should inform their doctor if they wish to become pregnant or think they might be pregnant. Women of child-bearing potential will need to have a negative pregnancy test before starting this medicine. There is a potential for serious side effects to an unborn child. Talk to your health care professional or pharmacist for more information. Do not breast-feed an infant while taking this medicine. Men must use a latex condom during sexual contact with a woman while taking this medicine and for at least 4 months after stopping it. A latex condom is needed even if you have had a vasectomy. Contact your doctor right away if your partner becomes pregnant. Do   not donate sperm while taking this medicine and for at least 4 months after you stop taking this medicine. Men should inform their doctors if they wish to father a child. This medicine may lower sperm counts. What side effects may I notice from receiving this medicine? Side effects that  you should report to your doctor or health care professional as soon as possible: -allergic reactions like skin rash, itching or hives, swelling of the face, lips, or tongue -low blood counts - this medicine may decrease the number of white blood cells, red blood cells and platelets. You may be at increased risk for infections and bleeding. -signs of infection - fever or chills, cough, sore throat, pain or difficulty passing urine -signs of decreased platelets or bleeding - bruising, pinpoint red spots on the skin, black, tarry stools, blood in the urine -signs of decreased red blood cells - unusually weak or tired, fainting spells, lightheadedness -breathing problems -changes in vision -mouth or throat sores or ulcers -pain, redness, swelling or irritation at the injection site -pain, tingling, numbness in the hands or feet -redness, blistering, peeling or loosening of the skin, including inside the mouth -seizures -vomiting Side effects that usually do not require medical attention (report to your doctor or health care professional if they continue or are bothersome): -diarrhea -hair loss -loss of appetite -nausea -stomach pain This list may not describe all possible side effects. Call your doctor for medical advice about side effects. You may report side effects to FDA at 1-800-FDA-1088. Where should I keep my medicine? This drug is given in a hospital or clinic and will not be stored at home. NOTE: This sheet is a summary. It may not cover all possible information. If you have questions about this medicine, talk to your doctor, pharmacist, or health care provider.  2018 Elsevier/Gold Standard (2015-09-21 11:53:23)  

## 2017-04-10 ENCOUNTER — Ambulatory Visit (HOSPITAL_BASED_OUTPATIENT_CLINIC_OR_DEPARTMENT_OTHER): Payer: Medicare Other

## 2017-04-10 VITALS — BP 161/61 | HR 66 | Temp 97.5°F | Resp 16

## 2017-04-10 DIAGNOSIS — C3482 Malignant neoplasm of overlapping sites of left bronchus and lung: Secondary | ICD-10-CM | POA: Diagnosis not present

## 2017-04-10 DIAGNOSIS — Z5111 Encounter for antineoplastic chemotherapy: Secondary | ICD-10-CM | POA: Diagnosis not present

## 2017-04-10 DIAGNOSIS — C3402 Malignant neoplasm of left main bronchus: Secondary | ICD-10-CM

## 2017-04-10 DIAGNOSIS — C3492 Malignant neoplasm of unspecified part of left bronchus or lung: Secondary | ICD-10-CM | POA: Diagnosis not present

## 2017-04-10 DIAGNOSIS — Z510 Encounter for antineoplastic radiation therapy: Secondary | ICD-10-CM | POA: Diagnosis not present

## 2017-04-10 MED ORDER — DEXAMETHASONE SODIUM PHOSPHATE 10 MG/ML IJ SOLN
10.0000 mg | Freq: Once | INTRAMUSCULAR | Status: AC
  Administered 2017-04-10: 10 mg via INTRAVENOUS

## 2017-04-10 MED ORDER — SODIUM CHLORIDE 0.9% FLUSH
10.0000 mL | INTRAVENOUS | Status: DC | PRN
  Administered 2017-04-10: 10 mL
  Filled 2017-04-10: qty 10

## 2017-04-10 MED ORDER — SODIUM CHLORIDE 0.9 % IV SOLN
60.0000 mg/m2 | Freq: Once | INTRAVENOUS | Status: AC
  Administered 2017-04-10: 120 mg via INTRAVENOUS
  Filled 2017-04-10: qty 5

## 2017-04-10 MED ORDER — HEPARIN SOD (PORK) LOCK FLUSH 100 UNIT/ML IV SOLN
500.0000 [IU] | Freq: Once | INTRAVENOUS | Status: AC | PRN
  Administered 2017-04-10: 500 [IU]
  Filled 2017-04-10: qty 5

## 2017-04-10 MED ORDER — SODIUM CHLORIDE 0.9 % IV SOLN
Freq: Once | INTRAVENOUS | Status: AC
  Administered 2017-04-10: 12:00:00 via INTRAVENOUS

## 2017-04-10 MED ORDER — FAMOTIDINE IN NACL 20-0.9 MG/50ML-% IV SOLN
INTRAVENOUS | Status: AC
  Filled 2017-04-10: qty 100

## 2017-04-10 MED ORDER — DEXAMETHASONE SODIUM PHOSPHATE 10 MG/ML IJ SOLN
INTRAMUSCULAR | Status: AC
  Filled 2017-04-10: qty 1

## 2017-04-10 MED ORDER — FAMOTIDINE IN NACL 20-0.9 MG/50ML-% IV SOLN
40.0000 mg | Freq: Once | INTRAVENOUS | Status: AC
  Administered 2017-04-10: 40 mg via INTRAVENOUS

## 2017-04-10 NOTE — Patient Instructions (Signed)
Etoposide, VP-16 injection What is this medicine? ETOPOSIDE, VP-16 (e toe POE side) is a chemotherapy drug. It is used to treat testicular cancer, lung cancer, and other cancers. This medicine may be used for other purposes; ask your health care provider or pharmacist if you have questions. COMMON BRAND NAME(S): Etopophos, Toposar, VePesid What should I tell my health care provider before I take this medicine? They need to know if you have any of these conditions: -infection -kidney disease -liver disease -low blood counts, like low white cell, platelet, or red cell counts -an unusual or allergic reaction to etoposide, other medicines, foods, dyes, or preservatives -pregnant or trying to get pregnant -breast-feeding How should I use this medicine? This medicine is for infusion into a vein. It is administered in a hospital or clinic by a specially trained health care professional. Talk to your pediatrician regarding the use of this medicine in children. Special care may be needed. Overdosage: If you think you have taken too much of this medicine contact a poison control center or emergency room at once. NOTE: This medicine is only for you. Do not share this medicine with others. What if I miss a dose? It is important not to miss your dose. Call your doctor or health care professional if you are unable to keep an appointment. What may interact with this medicine? -aspirin -certain medications for seizures like carbamazepine, phenobarbital, phenytoin, valproic acid -cyclosporine -levamisole -warfarin This list may not describe all possible interactions. Give your health care provider a list of all the medicines, herbs, non-prescription drugs, or dietary supplements you use. Also tell them if you smoke, drink alcohol, or use illegal drugs. Some items may interact with your medicine. What should I watch for while using this medicine? Visit your doctor for checks on your progress. This drug  may make you feel generally unwell. This is not uncommon, as chemotherapy can affect healthy cells as well as cancer cells. Report any side effects. Continue your course of treatment even though you feel ill unless your doctor tells you to stop. In some cases, you may be given additional medicines to help with side effects. Follow all directions for their use. Call your doctor or health care professional for advice if you get a fever, chills or sore throat, or other symptoms of a cold or flu. Do not treat yourself. This drug decreases your body's ability to fight infections. Try to avoid being around people who are sick. This medicine may increase your risk to bruise or bleed. Call your doctor or health care professional if you notice any unusual bleeding. Talk to your doctor about your risk of cancer. You may be more at risk for certain types of cancers if you take this medicine. Do not become pregnant while taking this medicine or for at least 6 months after stopping it. Women should inform their doctor if they wish to become pregnant or think they might be pregnant. Women of child-bearing potential will need to have a negative pregnancy test before starting this medicine. There is a potential for serious side effects to an unborn child. Talk to your health care professional or pharmacist for more information. Do not breast-feed an infant while taking this medicine. Men must use a latex condom during sexual contact with a woman while taking this medicine and for at least 4 months after stopping it. A latex condom is needed even if you have had a vasectomy. Contact your doctor right away if your partner becomes pregnant. Do   not donate sperm while taking this medicine and for at least 4 months after you stop taking this medicine. Men should inform their doctors if they wish to father a child. This medicine may lower sperm counts. What side effects may I notice from receiving this medicine? Side effects that  you should report to your doctor or health care professional as soon as possible: -allergic reactions like skin rash, itching or hives, swelling of the face, lips, or tongue -low blood counts - this medicine may decrease the number of white blood cells, red blood cells and platelets. You may be at increased risk for infections and bleeding. -signs of infection - fever or chills, cough, sore throat, pain or difficulty passing urine -signs of decreased platelets or bleeding - bruising, pinpoint red spots on the skin, black, tarry stools, blood in the urine -signs of decreased red blood cells - unusually weak or tired, fainting spells, lightheadedness -breathing problems -changes in vision -mouth or throat sores or ulcers -pain, redness, swelling or irritation at the injection site -pain, tingling, numbness in the hands or feet -redness, blistering, peeling or loosening of the skin, including inside the mouth -seizures -vomiting Side effects that usually do not require medical attention (report to your doctor or health care professional if they continue or are bothersome): -diarrhea -hair loss -loss of appetite -nausea -stomach pain This list may not describe all possible side effects. Call your doctor for medical advice about side effects. You may report side effects to FDA at 1-800-FDA-1088. Where should I keep my medicine? This drug is given in a hospital or clinic and will not be stored at home. NOTE: This sheet is a summary. It may not cover all possible information. If you have questions about this medicine, talk to your doctor, pharmacist, or health care provider.  2018 Elsevier/Gold Standard (2015-09-21 11:53:23)  

## 2017-04-13 ENCOUNTER — Ambulatory Visit: Payer: Medicare Other

## 2017-04-13 ENCOUNTER — Telehealth: Payer: Self-pay

## 2017-04-13 NOTE — Telephone Encounter (Signed)
Chemo follow up call done, patient feeling well.  He reports relief of constipation and eating well. NO nausea.

## 2017-04-14 DIAGNOSIS — Z510 Encounter for antineoplastic radiation therapy: Secondary | ICD-10-CM | POA: Diagnosis not present

## 2017-04-14 DIAGNOSIS — C3482 Malignant neoplasm of overlapping sites of left bronchus and lung: Secondary | ICD-10-CM | POA: Diagnosis not present

## 2017-04-16 ENCOUNTER — Encounter: Payer: Self-pay | Admitting: *Deleted

## 2017-04-16 DIAGNOSIS — Z510 Encounter for antineoplastic radiation therapy: Secondary | ICD-10-CM | POA: Diagnosis not present

## 2017-04-16 DIAGNOSIS — C3482 Malignant neoplasm of overlapping sites of left bronchus and lung: Secondary | ICD-10-CM | POA: Diagnosis not present

## 2017-04-17 DIAGNOSIS — Z510 Encounter for antineoplastic radiation therapy: Secondary | ICD-10-CM | POA: Diagnosis not present

## 2017-04-17 DIAGNOSIS — I10 Essential (primary) hypertension: Secondary | ICD-10-CM | POA: Diagnosis not present

## 2017-04-17 DIAGNOSIS — R12 Heartburn: Secondary | ICD-10-CM | POA: Diagnosis not present

## 2017-04-17 DIAGNOSIS — C3482 Malignant neoplasm of overlapping sites of left bronchus and lung: Secondary | ICD-10-CM | POA: Diagnosis not present

## 2017-04-17 DIAGNOSIS — C3402 Malignant neoplasm of left main bronchus: Secondary | ICD-10-CM | POA: Diagnosis not present

## 2017-04-17 DIAGNOSIS — E875 Hyperkalemia: Secondary | ICD-10-CM | POA: Diagnosis not present

## 2017-04-17 DIAGNOSIS — E782 Mixed hyperlipidemia: Secondary | ICD-10-CM | POA: Diagnosis not present

## 2017-04-20 DIAGNOSIS — Z510 Encounter for antineoplastic radiation therapy: Secondary | ICD-10-CM | POA: Diagnosis not present

## 2017-04-20 DIAGNOSIS — C3482 Malignant neoplasm of overlapping sites of left bronchus and lung: Secondary | ICD-10-CM | POA: Diagnosis not present

## 2017-04-21 DIAGNOSIS — C3482 Malignant neoplasm of overlapping sites of left bronchus and lung: Secondary | ICD-10-CM | POA: Diagnosis not present

## 2017-04-21 DIAGNOSIS — Z510 Encounter for antineoplastic radiation therapy: Secondary | ICD-10-CM | POA: Diagnosis not present

## 2017-04-22 DIAGNOSIS — Z510 Encounter for antineoplastic radiation therapy: Secondary | ICD-10-CM | POA: Diagnosis not present

## 2017-04-22 DIAGNOSIS — C3482 Malignant neoplasm of overlapping sites of left bronchus and lung: Secondary | ICD-10-CM | POA: Diagnosis not present

## 2017-04-23 DIAGNOSIS — Z510 Encounter for antineoplastic radiation therapy: Secondary | ICD-10-CM | POA: Diagnosis not present

## 2017-04-23 DIAGNOSIS — C3482 Malignant neoplasm of overlapping sites of left bronchus and lung: Secondary | ICD-10-CM | POA: Diagnosis not present

## 2017-04-24 DIAGNOSIS — C3482 Malignant neoplasm of overlapping sites of left bronchus and lung: Secondary | ICD-10-CM | POA: Diagnosis not present

## 2017-04-24 DIAGNOSIS — Z510 Encounter for antineoplastic radiation therapy: Secondary | ICD-10-CM | POA: Diagnosis not present

## 2017-04-27 DIAGNOSIS — Z510 Encounter for antineoplastic radiation therapy: Secondary | ICD-10-CM | POA: Diagnosis not present

## 2017-04-27 DIAGNOSIS — C3482 Malignant neoplasm of overlapping sites of left bronchus and lung: Secondary | ICD-10-CM | POA: Diagnosis not present

## 2017-04-28 DIAGNOSIS — Z510 Encounter for antineoplastic radiation therapy: Secondary | ICD-10-CM | POA: Diagnosis not present

## 2017-04-28 DIAGNOSIS — C3482 Malignant neoplasm of overlapping sites of left bronchus and lung: Secondary | ICD-10-CM | POA: Diagnosis not present

## 2017-04-29 DIAGNOSIS — C3482 Malignant neoplasm of overlapping sites of left bronchus and lung: Secondary | ICD-10-CM | POA: Diagnosis not present

## 2017-04-29 DIAGNOSIS — Z510 Encounter for antineoplastic radiation therapy: Secondary | ICD-10-CM | POA: Diagnosis not present

## 2017-04-30 DIAGNOSIS — Z510 Encounter for antineoplastic radiation therapy: Secondary | ICD-10-CM | POA: Diagnosis not present

## 2017-04-30 DIAGNOSIS — C3482 Malignant neoplasm of overlapping sites of left bronchus and lung: Secondary | ICD-10-CM | POA: Diagnosis not present

## 2017-05-01 ENCOUNTER — Other Ambulatory Visit: Payer: Self-pay | Admitting: *Deleted

## 2017-05-01 DIAGNOSIS — C3402 Malignant neoplasm of left main bronchus: Secondary | ICD-10-CM

## 2017-05-01 DIAGNOSIS — C3482 Malignant neoplasm of overlapping sites of left bronchus and lung: Secondary | ICD-10-CM | POA: Diagnosis not present

## 2017-05-01 DIAGNOSIS — Z510 Encounter for antineoplastic radiation therapy: Secondary | ICD-10-CM | POA: Diagnosis not present

## 2017-05-04 ENCOUNTER — Ambulatory Visit (HOSPITAL_BASED_OUTPATIENT_CLINIC_OR_DEPARTMENT_OTHER): Payer: Medicare Other

## 2017-05-04 ENCOUNTER — Other Ambulatory Visit (HOSPITAL_BASED_OUTPATIENT_CLINIC_OR_DEPARTMENT_OTHER): Payer: Medicare Other

## 2017-05-04 ENCOUNTER — Ambulatory Visit (HOSPITAL_BASED_OUTPATIENT_CLINIC_OR_DEPARTMENT_OTHER): Payer: Medicare Other | Admitting: Hematology & Oncology

## 2017-05-04 VITALS — BP 120/74 | HR 82 | Resp 18

## 2017-05-04 DIAGNOSIS — C3402 Malignant neoplasm of left main bronchus: Secondary | ICD-10-CM

## 2017-05-04 DIAGNOSIS — C3492 Malignant neoplasm of unspecified part of left bronchus or lung: Secondary | ICD-10-CM

## 2017-05-04 DIAGNOSIS — Z510 Encounter for antineoplastic radiation therapy: Secondary | ICD-10-CM | POA: Diagnosis not present

## 2017-05-04 DIAGNOSIS — Z5111 Encounter for antineoplastic chemotherapy: Secondary | ICD-10-CM

## 2017-05-04 DIAGNOSIS — C3482 Malignant neoplasm of overlapping sites of left bronchus and lung: Secondary | ICD-10-CM | POA: Diagnosis not present

## 2017-05-04 LAB — CMP (CANCER CENTER ONLY)
ALBUMIN: 2.5 g/dL — AB (ref 3.3–5.5)
ALK PHOS: 80 U/L (ref 26–84)
ALT(SGPT): 24 U/L (ref 10–47)
AST: 28 U/L (ref 11–38)
BILIRUBIN TOTAL: 0.5 mg/dL (ref 0.20–1.60)
BUN, Bld: 17 mg/dL (ref 7–22)
CALCIUM: 8.4 mg/dL (ref 8.0–10.3)
CO2: 30 mEq/L (ref 18–33)
Chloride: 93 mEq/L — ABNORMAL LOW (ref 98–108)
Creat: 1.5 mg/dl — ABNORMAL HIGH (ref 0.6–1.2)
Glucose, Bld: 117 mg/dL (ref 73–118)
POTASSIUM: 4.5 meq/L (ref 3.3–4.7)
Sodium: 130 mEq/L (ref 128–145)
Total Protein: 7.6 g/dL (ref 6.4–8.1)

## 2017-05-04 LAB — CBC WITH DIFFERENTIAL (CANCER CENTER ONLY)
BASO#: 0 10*3/uL (ref 0.0–0.2)
BASO%: 0.3 % (ref 0.0–2.0)
EOS ABS: 0 10*3/uL (ref 0.0–0.5)
EOS%: 0.5 % (ref 0.0–7.0)
HEMATOCRIT: 28.5 % — AB (ref 38.7–49.9)
HEMOGLOBIN: 9.5 g/dL — AB (ref 13.0–17.1)
LYMPH#: 0.7 10*3/uL — AB (ref 0.9–3.3)
LYMPH%: 10.8 % — ABNORMAL LOW (ref 14.0–48.0)
MCH: 28.5 pg (ref 28.0–33.4)
MCHC: 33.3 g/dL (ref 32.0–35.9)
MCV: 86 fL (ref 82–98)
MONO#: 0.7 10*3/uL (ref 0.1–0.9)
MONO%: 10.3 % (ref 0.0–13.0)
NEUT%: 78.1 % (ref 40.0–80.0)
NEUTROS ABS: 5.1 10*3/uL (ref 1.5–6.5)
Platelets: 335 10*3/uL (ref 145–400)
RBC: 3.33 10*6/uL — ABNORMAL LOW (ref 4.20–5.70)
RDW: 14.9 % (ref 11.1–15.7)
WBC: 6.5 10*3/uL (ref 4.0–10.0)

## 2017-05-04 MED ORDER — PALONOSETRON HCL INJECTION 0.25 MG/5ML
INTRAVENOUS | Status: AC
  Filled 2017-05-04: qty 5

## 2017-05-04 MED ORDER — SODIUM CHLORIDE 0.9 % IV SOLN
60.0000 mg/m2 | Freq: Once | INTRAVENOUS | Status: AC
  Administered 2017-05-04: 120 mg via INTRAVENOUS
  Filled 2017-05-04: qty 6

## 2017-05-04 MED ORDER — SODIUM CHLORIDE 0.9 % IV SOLN
280.0000 mg | Freq: Once | INTRAVENOUS | Status: AC
  Administered 2017-05-04: 280 mg via INTRAVENOUS
  Filled 2017-05-04: qty 28

## 2017-05-04 MED ORDER — DEXAMETHASONE SODIUM PHOSPHATE 10 MG/ML IJ SOLN
INTRAMUSCULAR | Status: AC
Start: 2017-05-04 — End: 2017-05-04
  Filled 2017-05-04: qty 1

## 2017-05-04 MED ORDER — SODIUM CHLORIDE 0.9 % IV SOLN
Freq: Once | INTRAVENOUS | Status: AC
  Administered 2017-05-04: 15:00:00 via INTRAVENOUS

## 2017-05-04 MED ORDER — HEPARIN SOD (PORK) LOCK FLUSH 100 UNIT/ML IV SOLN
500.0000 [IU] | Freq: Once | INTRAVENOUS | Status: AC | PRN
  Administered 2017-05-04: 500 [IU]
  Filled 2017-05-04: qty 5

## 2017-05-04 MED ORDER — DEXAMETHASONE SODIUM PHOSPHATE 10 MG/ML IJ SOLN
10.0000 mg | Freq: Once | INTRAMUSCULAR | Status: AC
  Administered 2017-05-04: 10 mg via INTRAVENOUS

## 2017-05-04 MED ORDER — SODIUM CHLORIDE 0.9% FLUSH
10.0000 mL | INTRAVENOUS | Status: DC | PRN
Start: 2017-05-04 — End: 2017-05-04
  Administered 2017-05-04: 10 mL
  Filled 2017-05-04: qty 10

## 2017-05-04 MED ORDER — PALONOSETRON HCL INJECTION 0.25 MG/5ML
0.2500 mg | Freq: Once | INTRAVENOUS | Status: AC
  Administered 2017-05-04: 0.25 mg via INTRAVENOUS

## 2017-05-04 NOTE — Progress Notes (Signed)
Hematology and Oncology Follow Up Visit  Harry Hinton [id number] Apr 21, 1933 83 y.o. 05/04/2017   Principle Diagnosis:   Locally advanced non-small cell lung cancer of the left lung - Stage IIIB (T3N2M0)  - SCCa  Current Therapy:    Carboplatin/Etoposide - s/p cycle #1  Radiation Therapy to the chest - concurrent     Interim History:  Harry Hinton is in for his follow-up. He really has done quite nicely. He tolerated his first cycle of chemotherapy quite well.  He is doing radiation right now. He is doing this down at the [facility]. He's done well with this. He does have a little bit of odynophagia.  He is eating pretty good. His weight is holding pretty steady.  He's had no shortness of breath. He has an occasional cough.  He's had no nausea or bombing. He's had no diarrhea. He's had no bleeding. He's had no leg swelling.   There's been no headache. He's had no mouth sores.   Overall, his performance status is ECOG 1.   Medications:  Current Outpatient Prescriptions:  .  omeprazole (PRILOSEC) 40 MG capsule, Take 40 mg by mouth., Disp: , Rfl:  .  aspirin EC 81 MG tablet, Take 81 mg by mouth daily., Disp: , Rfl:  .  carvedilol (COREG) 3.125 MG tablet, Take 1 tablet (3.125 mg total) by mouth 2 (two) times daily with a meal., Disp: 60 tablet, Rfl: 0 .  lidocaine-prilocaine (EMLA) cream, Apply to affected area once, Disp: 30 g, Rfl: 3 .  LORazepam (ATIVAN) 0.5 MG tablet, Take 1 tablet (0.5 mg total) by mouth every 6 (six) hours as needed (Nausea or vomiting)., Disp: 30 tablet, Rfl: 0 .  Multiple Vitamins-Minerals (CENTRUM SILVER 50+MEN) TABS, Take 1 tablet by mouth daily., Disp: , Rfl:  .  ondansetron (ZOFRAN) 8 MG tablet, Take 1 tablet (8 mg total) by mouth 2 (two) times daily as needed for refractory nausea / vomiting. Start on day 3 after carboplatin chemo., Disp: 30 tablet, Rfl: 1 .  prochlorperazine (COMPAZINE) 10 MG tablet, Take 1 tablet (10 mg total) by mouth every  6 (six) hours as needed (Nausea or vomiting)., Disp: 30 tablet, Rfl: 1 .  vitamin E 200 UNIT capsule, Take 200 Units by mouth daily., Disp: , Rfl:   Allergies:  Allergies  Allergen Reactions  . No Known Allergies     Past Medical History, Surgical history, Social history, and Family History were reviewed and updated.  Review of Systems:  As above   Physical Exam: 98.1,  71, 154/68,  Wt is 184lb  Wt Readings from Last 3 Encounters:  05/04/17 188 lb 4 oz (85.4 kg)  03/12/17 184 lb 4.8 oz (83.6 kg)     Well-developed and well-nourished white male in no obvious distress. Head and neck exam shows no ocular or oral lesions. He has no palpable cervical or supraclavicular lymph nodes. Lungs are clear bilaterally. Cardiac exam regular rate and rhythm with no murmurs, rubs or bruits. Abdomen is soft. Has good bowel sounds. There is no fluid wave. There is no palpable liver or spleen tip. Back exam shows no tenderness over the spine, ribs or hips. Extremities shows no clubbing, cyanosis or edema. Neurological exam shows no focal neurological deficits. Skin exam shows no rashes, ecchymoses or petechia.   Lab Results  Component Value Date   WBC 6.5 05/04/2017   HGB 9.5 (L) 05/04/2017   HCT 28.5 (L) 05/04/2017   MCV 86 05/04/2017   PLT  335 05/04/2017     Chemistry      Component Value Date/Time   NA 131 04/07/2017 1134   K 4.2 04/07/2017 1134   CL 95 (L) 04/07/2017 1134   CO2 29 04/07/2017 1134   BUN 21 04/07/2017 1134   CREATININE 1.0 04/07/2017 1134      Component Value Date/Time   CALCIUM 8.7 04/07/2017 1134   ALKPHOS 94 (H) 04/07/2017 1134   AST 21 04/07/2017 1134   ALT 19 04/07/2017 1134   BILITOT 0.50 04/07/2017 1134         Impression and Plan: Harry Hinton is a 83 year old white male. He has local advanced non-small cell lung cancer of the left lung. Clinically, this looks like stage IIIB.  I am very impressed with how well he is done. He really looks good.  We will  proceed with his second cycle of treatment.  He still has another 4 weeks of radiation to complete.  I will like to see him back in 4 weeks. By then, he will be close to finishing his radiation.  We probably would not do any scans on him for about 6 weeks after completion of radiation.    Volanda Napoleon, MD 7/23/20181:42 PM was

## 2017-05-04 NOTE — Patient Instructions (Signed)
[facility] Discharge Instructions for Patients Receiving Chemotherapy  Today you received the following chemotherapy agents Carbo, VP16  To help prevent nausea and vomiting after your treatment, we encourage you to take your nausea medication    If you develop nausea and vomiting that is not controlled by your nausea medication, call the clinic.   BELOW ARE SYMPTOMS THAT SHOULD BE REPORTED IMMEDIATELY:  *FEVER GREATER THAN 100.5 F  *CHILLS WITH OR WITHOUT FEVER  NAUSEA AND VOMITING THAT IS NOT CONTROLLED WITH YOUR NAUSEA MEDICATION  *UNUSUAL SHORTNESS OF BREATH  *UNUSUAL BRUISING OR BLEEDING  TENDERNESS IN MOUTH AND THROAT WITH OR WITHOUT PRESENCE OF ULCERS  *URINARY PROBLEMS  *BOWEL PROBLEMS  UNUSUAL RASH Items with * indicate a potential emergency and should be followed up as soon as possible.  Feel free to call the clinic you have any questions or concerns. The clinic phone number is [phone].  Please show the [place] at check-in to the Emergency Department and triage nurse.

## 2017-05-05 ENCOUNTER — Ambulatory Visit: Payer: Medicare Other

## 2017-05-05 ENCOUNTER — Encounter: Payer: Self-pay | Admitting: *Deleted

## 2017-05-05 ENCOUNTER — Ambulatory Visit (HOSPITAL_BASED_OUTPATIENT_CLINIC_OR_DEPARTMENT_OTHER): Payer: Medicare Other

## 2017-05-05 VITALS — BP 169/75 | HR 73 | Temp 97.5°F | Resp 18

## 2017-05-05 DIAGNOSIS — C3482 Malignant neoplasm of overlapping sites of left bronchus and lung: Secondary | ICD-10-CM | POA: Diagnosis not present

## 2017-05-05 DIAGNOSIS — Z510 Encounter for antineoplastic radiation therapy: Secondary | ICD-10-CM | POA: Diagnosis not present

## 2017-05-05 DIAGNOSIS — C3492 Malignant neoplasm of unspecified part of left bronchus or lung: Secondary | ICD-10-CM | POA: Diagnosis not present

## 2017-05-05 DIAGNOSIS — Z5111 Encounter for antineoplastic chemotherapy: Secondary | ICD-10-CM

## 2017-05-05 DIAGNOSIS — C3402 Malignant neoplasm of left main bronchus: Secondary | ICD-10-CM

## 2017-05-05 LAB — IRON AND TIBC
%SAT: 11 % — AB (ref 20–55)
Iron: 21 ug/dL — ABNORMAL LOW (ref 42–163)
TIBC: 190 ug/dL — AB (ref 202–409)
UIBC: 170 ug/dL (ref 117–376)

## 2017-05-05 LAB — FERRITIN: Ferritin: 611 ng/ml — ABNORMAL HIGH (ref 22–316)

## 2017-05-05 MED ORDER — DEXAMETHASONE SODIUM PHOSPHATE 10 MG/ML IJ SOLN
INTRAMUSCULAR | Status: AC
  Filled 2017-05-05: qty 1

## 2017-05-05 MED ORDER — SODIUM CHLORIDE 0.9 % IV SOLN
Freq: Once | INTRAVENOUS | Status: AC
  Administered 2017-05-05: 10:00:00 via INTRAVENOUS

## 2017-05-05 MED ORDER — SODIUM CHLORIDE 0.9% FLUSH
10.0000 mL | INTRAVENOUS | Status: DC | PRN
  Administered 2017-05-05: 10 mL
  Filled 2017-05-05: qty 10

## 2017-05-05 MED ORDER — DEXAMETHASONE SODIUM PHOSPHATE 10 MG/ML IJ SOLN
10.0000 mg | Freq: Once | INTRAMUSCULAR | Status: AC
  Administered 2017-05-05: 10 mg via INTRAVENOUS

## 2017-05-05 MED ORDER — SODIUM CHLORIDE 0.9 % IV SOLN
60.0000 mg/m2 | Freq: Once | INTRAVENOUS | Status: AC
  Administered 2017-05-05: 120 mg via INTRAVENOUS
  Filled 2017-05-05: qty 6

## 2017-05-05 MED ORDER — HEPARIN SOD (PORK) LOCK FLUSH 100 UNIT/ML IV SOLN
500.0000 [IU] | Freq: Once | INTRAVENOUS | Status: AC | PRN
  Administered 2017-05-05: 500 [IU]
  Filled 2017-05-05: qty 5

## 2017-05-05 NOTE — Patient Instructions (Signed)
[facility] Discharge Instructions for Patients Receiving Chemotherapy  Today you received the following chemotherapy agents:  Etoposide  To help prevent nausea and vomiting after your treatment, we encourage you to take your nausea medication as ordered per MD.    If you develop nausea and vomiting that is not controlled by your nausea medication, call the clinic.   BELOW ARE SYMPTOMS THAT SHOULD BE REPORTED IMMEDIATELY:  *FEVER GREATER THAN 100.5 F  *CHILLS WITH OR WITHOUT FEVER  NAUSEA AND VOMITING THAT IS NOT CONTROLLED WITH YOUR NAUSEA MEDICATION  *UNUSUAL SHORTNESS OF BREATH  *UNUSUAL BRUISING OR BLEEDING  TENDERNESS IN MOUTH AND THROAT WITH OR WITHOUT PRESENCE OF ULCERS  *URINARY PROBLEMS  *BOWEL PROBLEMS  UNUSUAL RASH Items with * indicate a potential emergency and should be followed up as soon as possible.  Feel free to call the clinic you have any questions or concerns. The clinic phone number is [phone].  Please show the [facility] at check-in to the Emergency Department and triage nurse.

## 2017-05-05 NOTE — Progress Notes (Unsigned)
AUDIT

## 2017-05-06 ENCOUNTER — Ambulatory Visit (HOSPITAL_BASED_OUTPATIENT_CLINIC_OR_DEPARTMENT_OTHER): Payer: Medicare Other

## 2017-05-06 ENCOUNTER — Other Ambulatory Visit: Payer: Self-pay | Admitting: Family

## 2017-05-06 VITALS — BP 124/82 | HR 72 | Temp 97.8°F | Resp 20

## 2017-05-06 DIAGNOSIS — Z5111 Encounter for antineoplastic chemotherapy: Secondary | ICD-10-CM | POA: Diagnosis not present

## 2017-05-06 DIAGNOSIS — C3482 Malignant neoplasm of overlapping sites of left bronchus and lung: Secondary | ICD-10-CM | POA: Diagnosis not present

## 2017-05-06 DIAGNOSIS — Z510 Encounter for antineoplastic radiation therapy: Secondary | ICD-10-CM | POA: Diagnosis not present

## 2017-05-06 DIAGNOSIS — C3492 Malignant neoplasm of unspecified part of left bronchus or lung: Secondary | ICD-10-CM

## 2017-05-06 DIAGNOSIS — C3402 Malignant neoplasm of left main bronchus: Secondary | ICD-10-CM

## 2017-05-06 MED ORDER — SODIUM CHLORIDE 0.9 % IV SOLN
60.0000 mg/m2 | Freq: Once | INTRAVENOUS | Status: AC
  Administered 2017-05-06: 120 mg via INTRAVENOUS
  Filled 2017-05-06: qty 6

## 2017-05-06 MED ORDER — DEXAMETHASONE SODIUM PHOSPHATE 10 MG/ML IJ SOLN
10.0000 mg | Freq: Once | INTRAMUSCULAR | Status: AC
  Administered 2017-05-06: 10 mg via INTRAVENOUS

## 2017-05-06 MED ORDER — HEPARIN SOD (PORK) LOCK FLUSH 100 UNIT/ML IV SOLN
500.0000 [IU] | Freq: Once | INTRAVENOUS | Status: AC | PRN
  Administered 2017-05-06: 500 [IU]
  Filled 2017-05-06: qty 5

## 2017-05-06 MED ORDER — SODIUM CHLORIDE 0.9 % IV SOLN
Freq: Once | INTRAVENOUS | Status: AC
  Administered 2017-05-06: 10:00:00 via INTRAVENOUS

## 2017-05-06 MED ORDER — DEXAMETHASONE SODIUM PHOSPHATE 10 MG/ML IJ SOLN
INTRAMUSCULAR | Status: AC
  Filled 2017-05-06: qty 1

## 2017-05-06 MED ORDER — SODIUM CHLORIDE 0.9% FLUSH
10.0000 mL | INTRAVENOUS | Status: DC | PRN
  Administered 2017-05-06: 10 mL
  Filled 2017-05-06: qty 10

## 2017-05-06 NOTE — Patient Instructions (Signed)
[facility] Discharge Instructions for Patients Receiving Chemotherapy  Today you received the following chemotherapy agents VP16  To help prevent nausea and vomiting after your treatment, we encourage you to take your nausea medication    If you develop nausea and vomiting that is not controlled by your nausea medication, call the clinic.   BELOW ARE SYMPTOMS THAT SHOULD BE REPORTED IMMEDIATELY:  *FEVER GREATER THAN 100.5 F  *CHILLS WITH OR WITHOUT FEVER  NAUSEA AND VOMITING THAT IS NOT CONTROLLED WITH YOUR NAUSEA MEDICATION  *UNUSUAL SHORTNESS OF BREATH  *UNUSUAL BRUISING OR BLEEDING  TENDERNESS IN MOUTH AND THROAT WITH OR WITHOUT PRESENCE OF ULCERS  *URINARY PROBLEMS  *BOWEL PROBLEMS  UNUSUAL RASH Items with * indicate a potential emergency and should be followed up as soon as possible.  Feel free to call the clinic you have any questions or concerns. The clinic phone number is [phone].  Please show the [place] at check-in to the Emergency Department and triage nurse.

## 2017-05-07 ENCOUNTER — Ambulatory Visit (HOSPITAL_BASED_OUTPATIENT_CLINIC_OR_DEPARTMENT_OTHER): Payer: Medicare Other

## 2017-05-07 VITALS — BP 151/77 | HR 68 | Temp 97.6°F | Resp 16

## 2017-05-07 DIAGNOSIS — C3482 Malignant neoplasm of overlapping sites of left bronchus and lung: Secondary | ICD-10-CM | POA: Diagnosis not present

## 2017-05-07 DIAGNOSIS — C3492 Malignant neoplasm of unspecified part of left bronchus or lung: Secondary | ICD-10-CM

## 2017-05-07 DIAGNOSIS — Z5111 Encounter for antineoplastic chemotherapy: Secondary | ICD-10-CM | POA: Diagnosis not present

## 2017-05-07 DIAGNOSIS — Z510 Encounter for antineoplastic radiation therapy: Secondary | ICD-10-CM | POA: Diagnosis not present

## 2017-05-07 DIAGNOSIS — C3402 Malignant neoplasm of left main bronchus: Secondary | ICD-10-CM

## 2017-05-07 MED ORDER — DEXAMETHASONE SODIUM PHOSPHATE 10 MG/ML IJ SOLN
10.0000 mg | Freq: Once | INTRAMUSCULAR | Status: AC
  Administered 2017-05-07: 10 mg via INTRAVENOUS

## 2017-05-07 MED ORDER — SODIUM CHLORIDE 0.9 % IV SOLN
Freq: Once | INTRAVENOUS | Status: AC
  Administered 2017-05-07: 10:00:00 via INTRAVENOUS

## 2017-05-07 MED ORDER — HEPARIN SOD (PORK) LOCK FLUSH 100 UNIT/ML IV SOLN
500.0000 [IU] | Freq: Once | INTRAVENOUS | Status: AC | PRN
  Administered 2017-05-07: 500 [IU]
  Filled 2017-05-07: qty 5

## 2017-05-07 MED ORDER — SODIUM CHLORIDE 0.9 % IV SOLN
60.0000 mg/m2 | Freq: Once | INTRAVENOUS | Status: AC
  Administered 2017-05-07: 120 mg via INTRAVENOUS
  Filled 2017-05-07: qty 6

## 2017-05-07 MED ORDER — SODIUM CHLORIDE 0.9% FLUSH
10.0000 mL | INTRAVENOUS | Status: DC | PRN
  Administered 2017-05-07: 10 mL
  Filled 2017-05-07: qty 10

## 2017-05-07 MED ORDER — DEXAMETHASONE SODIUM PHOSPHATE 10 MG/ML IJ SOLN
INTRAMUSCULAR | Status: AC
  Filled 2017-05-07: qty 1

## 2017-05-07 NOTE — Patient Instructions (Signed)
Dexamethasone injection What is this medicine? DEXAMETHASONE (dex a METH a sone) is a corticosteroid. It is used to treat inflammation of the skin, joints, lungs, and other organs. Common conditions treated include asthma, allergies, and arthritis. It is also used for other conditions, like blood disorders and diseases of the adrenal glands. This medicine may be used for other purposes; ask your health care provider or pharmacist if you have questions. COMMON BRAND NAME(S): Decadron, DoubleDex, Simplist Dexamethasone, Solurex What should I tell my health care provider before I take this medicine? They need to know if you have any of these conditions: -blood clotting problems -Cushing's syndrome -diabetes -glaucoma -heart problems or disease -high blood pressure -infection like herpes, measles, tuberculosis, or chickenpox -kidney disease -liver disease -mental problems -myasthenia gravis -osteoporosis -previous heart attack -seizures -stomach, ulcer or intestine disease including colitis and diverticulitis -thyroid problem -an unusual or allergic reaction to dexamethasone, corticosteroids, other medicines, lactose, foods, dyes, or preservatives -pregnant or trying to get pregnant -breast-feeding How should I use this medicine? This medicine is for injection into a muscle, joint, lesion, soft tissue, or vein. It is given by a health care professional in a hospital or clinic setting. Talk to your pediatrician regarding the use of this medicine in children. Special care may be needed. Overdosage: If you think you have taken too much of this medicine contact a poison control center or emergency room at once. NOTE: This medicine is only for you. Do not share this medicine with others. What if I miss a dose? This may not apply. If you are having a series of injections over a prolonged period, try not to miss an appointment. Call your doctor or health care professional to reschedule if you  are unable to keep an appointment. What may interact with this medicine? Do not take this medicine with any of the following medications: -mifepristone, RU-486 -vaccines This medicine may also interact with the following medications: -amphotericin B -antibiotics like clarithromycin, erythromycin, and troleandomycin -aspirin and aspirin-like drugs -barbiturates like phenobarbital -carbamazepine -cholestyramine -cholinesterase inhibitors like donepezil, galantamine, rivastigmine, and tacrine -cyclosporine -digoxin -diuretics -ephedrine -male hormones, like estrogens or progestins and birth control pills -indinavir -isoniazid -ketoconazole -medicines for diabetes -medicines that improve muscle tone or strength for conditions like myasthenia gravis -NSAIDs, medicines for pain and inflammation, like ibuprofen or naproxen -phenytoin -rifampin -thalidomide -warfarin This list may not describe all possible interactions. Give your health care provider a list of all the medicines, herbs, non-prescription drugs, or dietary supplements you use. Also tell them if you smoke, drink alcohol, or use illegal drugs. Some items may interact with your medicine. What should I watch for while using this medicine? Your condition will be monitored carefully while you are receiving this medicine. If you are taking this medicine for a long time, carry an identification card with your name and address, the type and dose of your medicine, and your doctor's name and address. This medicine may increase your risk of getting an infection. Stay away from people who are sick. Tell your doctor or health care professional if you are around anyone with measles or chickenpox. Talk to your health care provider before you get any vaccines that you take this medicine. If you are going to have surgery, tell your doctor or health care professional that you have taken this medicine within the last twelve months. Ask your  doctor or health care professional about your diet. You may need to lower the amount of salt you eat. The  medicine can increase your blood sugar. If you are a diabetic check with your doctor if you need help adjusting the dose of your diabetic medicine. What side effects may I notice from receiving this medicine? Side effects that you should report to your doctor or health care professional as soon as possible: -allergic reactions like skin rash, itching or hives, swelling of the face, lips, or tongue -black or tarry stools -change in the amount of urine -changes in vision -confusion, excitement, restlessness, a false sense of well-being -fever, sore throat, sneezing, cough, or other signs of infection, wounds that will not heal -hallucinations -increased thirst -mental depression, mood swings, mistaken feelings of self importance or of being mistreated -pain in hips, back, ribs, arms, shoulders, or legs -pain, redness, or irritation at the injection site -redness, blistering, peeling or loosening of the skin, including inside the mouth -rounding out of face -swelling of feet or lower legs -unusual bleeding or bruising -unusual tired or weak -wounds that do not heal Side effects that usually do not require medical attention (report to your doctor or health care professional if they continue or are bothersome): -diarrhea or constipation -change in taste -headache -nausea, vomiting -skin problems, acne, thin and shiny skin -touble sleeping -unusual growth of hair on the face or body -weight gain This list may not describe all possible side effects. Call your doctor for medical advice about side effects. You may report side effects to FDA at 1-800-FDA-1088. Where should I keep my medicine? This drug is given in a hospital or clinic and will not be stored at home. NOTE: This sheet is a summary. It may not cover all possible information. If you have questions about this medicine, talk to  your doctor, pharmacist, or health care provider.  2018 Elsevier/Gold Standard (2008-01-20 14:04:12) Palonosetron Injection What is this medicine? PALONOSETRON (pal oh NOE se tron) is used to prevent nausea and vomiting caused by chemotherapy. It also helps prevent delayed nausea and vomiting that may occur a few days after your treatment. This medicine may be used for other purposes; ask your health care provider or pharmacist if you have questions. COMMON BRAND NAME(S): Aloxi What should I tell my health care provider before I take this medicine? They need to know if you have any of these conditions: -an unusual or allergic reaction to palonosetron, dolasetron, granisetron, ondansetron, other medicines, foods, dyes, or preservatives -pregnant or trying to get pregnant -breast-feeding How should I use this medicine? This medicine is for infusion into a vein. It is given by a health care professional in a hospital or clinic setting. Talk to your pediatrician regarding the use of this medicine in children. While this drug may be prescribed for children as young as 1 month for selected conditions, precautions do apply. Overdosage: If you think you have taken too much of this medicine contact a poison control center or emergency room at once. NOTE: This medicine is only for you. Do not share this medicine with others. What if I miss a dose? This does not apply. What may interact with this medicine? -certain medicines for depression, anxiety, or psychotic disturbances -fentanyl -linezolid -MAOIs like Carbex, Eldepryl, Marplan, Nardil, and Parnate -methylene blue (injected into a vein) -tramadol This list may not describe all possible interactions. Give your health care provider a list of all the medicines, herbs, non-prescription drugs, or dietary supplements you use. Also tell them if you smoke, drink alcohol, or use illegal drugs. Some items may interact with your medicine.  What should I  watch for while using this medicine? Your condition will be monitored carefully while you are receiving this medicine. What side effects may I notice from receiving this medicine? Side effects that you should report to your doctor or health care professional as soon as possible: -allergic reactions like skin rash, itching or hives, swelling of the face, lips, or tongue -breathing problems -confusion -dizziness -fast, irregular heartbeat -fever and chills -loss of balance or coordination -seizures -sweating -swelling of the hands and feet -tremors -unusually weak or tired Side effects that usually do not require medical attention (report to your doctor or health care professional if they continue or are bothersome): -constipation or diarrhea -headache This list may not describe all possible side effects. Call your doctor for medical advice about side effects. You may report side effects to FDA at 1-800-FDA-1088. Where should I keep my medicine? This drug is given in a hospital or clinic and will not be stored at home. NOTE: This sheet is a summary. It may not cover all possible information. If you have questions about this medicine, talk to your doctor, pharmacist, or health care provider.  2018 Elsevier/Gold Standard (2013-08-05 10:38:36) Etoposide, VP-16 capsules What is this medicine? ETOPOSIDE, VP-16 (e toe POE side) is a chemotherapy drug. It is used to treat small cell lung cancer and other cancers. This medicine may be used for other purposes; ask your health care provider or pharmacist if you have questions. COMMON BRAND NAME(S): VePesid What should I tell my health care provider before I take this medicine? They need to know if you have any of these conditions: -infection -kidney disease -liver disease -low blood counts, like low white cell, platelet, or red cell counts -an unusual or allergic reaction to etoposide, other medicines, foods, dyes, or preservatives -pregnant  or trying to get pregnant -breast-feeding How should I use this medicine? Take this medicine by mouth with a glass of water. Follow the directions on the prescription label. Do not open, crush, or chew the capsules. It is advisable to wear gloves when handling this medicine. Take your medicine at regular intervals. Do not take it more often than directed. Do not stop taking except on your doctor's advice. Talk to your pediatrician regarding the use of this medicine in children. Special care may be needed. Overdosage: If you think you have taken too much of this medicine contact a poison control center or emergency room at once. NOTE: This medicine is only for you. Do not share this medicine with others. What if I miss a dose? If you miss a dose, take it as soon as you can. If it is almost time for your next dose, take only that dose. Do not take double or extra doses. What may interact with this medicine? -aspirin -certain medications for seizures like carbamazepine, phenobarbital, phenytoin, valproic acid -cyclosporine -levamisole -valproic acid -warfarin This list may not describe all possible interactions. Give your health care provider a list of all the medicines, herbs, non-prescription drugs, or dietary supplements you use. Also tell them if you smoke, drink alcohol, or use illegal drugs. Some items may interact with your medicine. What should I watch for while using this medicine? Visit your doctor for checks on your progress. This drug may make you feel generally unwell. This is not uncommon, as chemotherapy can affect healthy cells as well as cancer cells. Report any side effects. Continue your course of treatment even though you feel ill unless your doctor tells you to  stop. In some cases, you may be given additional medicines to help with side effects. Follow all directions for their use. Call your doctor or health care professional for advice if you get a fever, chills or sore throat,  or other symptoms of a cold or flu. Do not treat yourself. This drug decreases your body's ability to fight infections. Try to avoid being around people who are sick. This medicine may increase your risk to bruise or bleed. Call your doctor or health care professional if you notice any unusual bleeding. Talk to your doctor about your risk of cancer. You may be more at risk for certain types of cancers if you take this medicine. Do not become pregnant while taking this medicine or for at least 6 months after stopping it. Women should inform their doctor if they wish to become pregnant or think they might be pregnant. Women of child-bearing potential will need to have a negative pregnancy test before starting this medicine. There is a potential for serious side effects to an unborn child. Talk to your health care professional or pharmacist for more information. Do not breast-feed an infant while taking this medicine. Men must use a latex condom during sexual contact with a woman while taking this medicine and for at least 4 months after stopping it. A latex condom is needed even if you have had a vasectomy. Contact your doctor right away if your partner becomes pregnant. Do not donate sperm while taking this medicine and for 4 months after you stop taking this medicine. Men should inform their doctors if they wish to father a child. This medicine may lower sperm counts. What side effects may I notice from receiving this medicine? Side effects that you should report to your doctor or health care professional as soon as possible: -allergic reactions like skin rash, itching or hives, swelling of the face, lips, or tongue -low blood counts - this medicine may decrease the number of white blood cells, red blood cells and platelets. You may be at increased risk for infections and bleeding. -signs of infection - fever or chills, cough, sore throat, pain or difficulty passing urine -signs of decreased platelets or  bleeding - bruising, pinpoint red spots on the skin, black, tarry stools, blood in the urine -signs of decreased red blood cells - unusually weak or tired, fainting spells, lightheadedness -breathing problems -changes in vision -mouth or throat sores or ulcers -pain, tingling, numbness in the hands or feet -redness, blistering, peeling or loosening of the skin, including inside the mouth -seizures -vomiting Side effects that usually do not require medical attention (report to your doctor or health care professional if they continue or are bothersome): -change in taste -diarrhea -hair loss -nausea -stomach pain This list may not describe all possible side effects. Call your doctor for medical advice about side effects. You may report side effects to FDA at 1-800-FDA-1088. Where should I keep my medicine? Keep out of the reach of children. Store in a refrigerator between 2 and 8 degrees C (36 and 46 degrees F). Do not freeze. Throw away any unused medicine after the expiration date. NOTE: This sheet is a summary. It may not cover all possible information. If you have questions about this medicine, talk to your doctor, pharmacist, or health care provider.  2018 Elsevier/Gold Standard (2015-09-21 11:49:52)

## 2017-05-08 ENCOUNTER — Ambulatory Visit (HOSPITAL_BASED_OUTPATIENT_CLINIC_OR_DEPARTMENT_OTHER): Payer: Medicare Other

## 2017-05-08 VITALS — BP 178/68 | HR 75 | Temp 97.6°F | Resp 16

## 2017-05-08 DIAGNOSIS — Z510 Encounter for antineoplastic radiation therapy: Secondary | ICD-10-CM | POA: Diagnosis not present

## 2017-05-08 DIAGNOSIS — C3492 Malignant neoplasm of unspecified part of left bronchus or lung: Secondary | ICD-10-CM | POA: Diagnosis not present

## 2017-05-08 DIAGNOSIS — Z5111 Encounter for antineoplastic chemotherapy: Secondary | ICD-10-CM

## 2017-05-08 DIAGNOSIS — C3482 Malignant neoplasm of overlapping sites of left bronchus and lung: Secondary | ICD-10-CM | POA: Diagnosis not present

## 2017-05-08 DIAGNOSIS — C3402 Malignant neoplasm of left main bronchus: Secondary | ICD-10-CM

## 2017-05-08 MED ORDER — HEPARIN SOD (PORK) LOCK FLUSH 100 UNIT/ML IV SOLN
500.0000 [IU] | Freq: Once | INTRAVENOUS | Status: AC | PRN
  Administered 2017-05-08: 500 [IU]
  Filled 2017-05-08: qty 5

## 2017-05-08 MED ORDER — DEXAMETHASONE SODIUM PHOSPHATE 10 MG/ML IJ SOLN
10.0000 mg | Freq: Once | INTRAMUSCULAR | Status: AC
  Administered 2017-05-08: 10 mg via INTRAVENOUS

## 2017-05-08 MED ORDER — ETOPOSIDE CHEMO INJECTION 1 GM/50ML
60.0000 mg/m2 | Freq: Once | INTRAVENOUS | Status: AC
  Administered 2017-05-08: 120 mg via INTRAVENOUS
  Filled 2017-05-08: qty 6

## 2017-05-08 MED ORDER — SODIUM CHLORIDE 0.9% FLUSH
10.0000 mL | INTRAVENOUS | Status: DC | PRN
  Administered 2017-05-08: 10 mL
  Filled 2017-05-08: qty 10

## 2017-05-08 MED ORDER — SODIUM CHLORIDE 0.9 % IV SOLN
Freq: Once | INTRAVENOUS | Status: AC
  Administered 2017-05-08: 11:00:00 via INTRAVENOUS

## 2017-05-08 MED ORDER — DEXAMETHASONE SODIUM PHOSPHATE 10 MG/ML IJ SOLN
INTRAMUSCULAR | Status: AC
  Filled 2017-05-08: qty 1

## 2017-05-08 NOTE — Patient Instructions (Signed)
Etoposide, VP-16 injection What is this medicine? ETOPOSIDE, VP-16 (e toe POE side) is a chemotherapy drug. It is used to treat testicular cancer, lung cancer, and other cancers. This medicine may be used for other purposes; ask your health care provider or pharmacist if you have questions. COMMON BRAND NAME(S): Etopophos, Toposar, VePesid What should I tell my health care provider before I take this medicine? They need to know if you have any of these conditions: -infection -kidney disease -liver disease -low blood counts, like low white cell, platelet, or red cell counts -an unusual or allergic reaction to etoposide, other medicines, foods, dyes, or preservatives -pregnant or trying to get pregnant -breast-feeding How should I use this medicine? This medicine is for infusion into a vein. It is administered in a hospital or clinic by a specially trained health care professional. Talk to your pediatrician regarding the use of this medicine in children. Special care may be needed. Overdosage: If you think you have taken too much of this medicine contact a poison control center or emergency room at once. NOTE: This medicine is only for you. Do not share this medicine with others. What if I miss a dose? It is important not to miss your dose. Call your doctor or health care professional if you are unable to keep an appointment. What may interact with this medicine? -aspirin -certain medications for seizures like carbamazepine, phenobarbital, phenytoin, valproic acid -cyclosporine -levamisole -warfarin This list may not describe all possible interactions. Give your health care provider a list of all the medicines, herbs, non-prescription drugs, or dietary supplements you use. Also tell them if you smoke, drink alcohol, or use illegal drugs. Some items may interact with your medicine. What should I watch for while using this medicine? Visit your doctor for checks on your progress. This drug  may make you feel generally unwell. This is not uncommon, as chemotherapy can affect healthy cells as well as cancer cells. Report any side effects. Continue your course of treatment even though you feel ill unless your doctor tells you to stop. In some cases, you may be given additional medicines to help with side effects. Follow all directions for their use. Call your doctor or health care professional for advice if you get a fever, chills or sore throat, or other symptoms of a cold or flu. Do not treat yourself. This drug decreases your body's ability to fight infections. Try to avoid being around people who are sick. This medicine may increase your risk to bruise or bleed. Call your doctor or health care professional if you notice any unusual bleeding. Talk to your doctor about your risk of cancer. You may be more at risk for certain types of cancers if you take this medicine. Do not become pregnant while taking this medicine or for at least 6 months after stopping it. Women should inform their doctor if they wish to become pregnant or think they might be pregnant. Women of child-bearing potential will need to have a negative pregnancy test before starting this medicine. There is a potential for serious side effects to an unborn child. Talk to your health care professional or pharmacist for more information. Do not breast-feed an infant while taking this medicine. Men must use a latex condom during sexual contact with a woman while taking this medicine and for at least 4 months after stopping it. A latex condom is needed even if you have had a vasectomy. Contact your doctor right away if your partner becomes pregnant. Do   not donate sperm while taking this medicine and for at least 4 months after you stop taking this medicine. Men should inform their doctors if they wish to father a child. This medicine may lower sperm counts. What side effects may I notice from receiving this medicine? Side effects that  you should report to your doctor or health care professional as soon as possible: -allergic reactions like skin rash, itching or hives, swelling of the face, lips, or tongue -low blood counts - this medicine may decrease the number of white blood cells, red blood cells and platelets. You may be at increased risk for infections and bleeding. -signs of infection - fever or chills, cough, sore throat, pain or difficulty passing urine -signs of decreased platelets or bleeding - bruising, pinpoint red spots on the skin, black, tarry stools, blood in the urine -signs of decreased red blood cells - unusually weak or tired, fainting spells, lightheadedness -breathing problems -changes in vision -mouth or throat sores or ulcers -pain, redness, swelling or irritation at the injection site -pain, tingling, numbness in the hands or feet -redness, blistering, peeling or loosening of the skin, including inside the mouth -seizures -vomiting Side effects that usually do not require medical attention (report to your doctor or health care professional if they continue or are bothersome): -diarrhea -hair loss -loss of appetite -nausea -stomach pain This list may not describe all possible side effects. Call your doctor for medical advice about side effects. You may report side effects to FDA at 1-800-FDA-1088. Where should I keep my medicine? This drug is given in a hospital or clinic and will not be stored at home. NOTE: This sheet is a summary. It may not cover all possible information. If you have questions about this medicine, talk to your doctor, pharmacist, or health care provider.  2018 Elsevier/Gold Standard (2015-09-21 11:53:23)  

## 2017-05-11 DIAGNOSIS — Z510 Encounter for antineoplastic radiation therapy: Secondary | ICD-10-CM | POA: Diagnosis not present

## 2017-05-11 DIAGNOSIS — C3482 Malignant neoplasm of overlapping sites of left bronchus and lung: Secondary | ICD-10-CM | POA: Diagnosis not present

## 2017-05-12 DIAGNOSIS — Z510 Encounter for antineoplastic radiation therapy: Secondary | ICD-10-CM | POA: Diagnosis not present

## 2017-05-12 DIAGNOSIS — C3482 Malignant neoplasm of overlapping sites of left bronchus and lung: Secondary | ICD-10-CM | POA: Diagnosis not present

## 2017-05-13 DIAGNOSIS — C3482 Malignant neoplasm of overlapping sites of left bronchus and lung: Secondary | ICD-10-CM | POA: Diagnosis not present

## 2017-05-13 DIAGNOSIS — Z510 Encounter for antineoplastic radiation therapy: Secondary | ICD-10-CM | POA: Diagnosis not present

## 2017-05-14 DIAGNOSIS — C3482 Malignant neoplasm of overlapping sites of left bronchus and lung: Secondary | ICD-10-CM | POA: Diagnosis not present

## 2017-05-14 DIAGNOSIS — Z510 Encounter for antineoplastic radiation therapy: Secondary | ICD-10-CM | POA: Diagnosis not present

## 2017-05-15 DIAGNOSIS — Z510 Encounter for antineoplastic radiation therapy: Secondary | ICD-10-CM | POA: Diagnosis not present

## 2017-05-15 DIAGNOSIS — C3482 Malignant neoplasm of overlapping sites of left bronchus and lung: Secondary | ICD-10-CM | POA: Diagnosis not present

## 2017-05-18 DIAGNOSIS — Z510 Encounter for antineoplastic radiation therapy: Secondary | ICD-10-CM | POA: Diagnosis not present

## 2017-05-18 DIAGNOSIS — C3482 Malignant neoplasm of overlapping sites of left bronchus and lung: Secondary | ICD-10-CM | POA: Diagnosis not present

## 2017-05-19 DIAGNOSIS — Z510 Encounter for antineoplastic radiation therapy: Secondary | ICD-10-CM | POA: Diagnosis not present

## 2017-05-19 DIAGNOSIS — C3482 Malignant neoplasm of overlapping sites of left bronchus and lung: Secondary | ICD-10-CM | POA: Diagnosis not present

## 2017-05-20 DIAGNOSIS — C3482 Malignant neoplasm of overlapping sites of left bronchus and lung: Secondary | ICD-10-CM | POA: Diagnosis not present

## 2017-05-20 DIAGNOSIS — Z510 Encounter for antineoplastic radiation therapy: Secondary | ICD-10-CM | POA: Diagnosis not present

## 2017-05-21 DIAGNOSIS — Z510 Encounter for antineoplastic radiation therapy: Secondary | ICD-10-CM | POA: Diagnosis not present

## 2017-05-21 DIAGNOSIS — C3482 Malignant neoplasm of overlapping sites of left bronchus and lung: Secondary | ICD-10-CM | POA: Diagnosis not present

## 2017-05-22 DIAGNOSIS — Z510 Encounter for antineoplastic radiation therapy: Secondary | ICD-10-CM | POA: Diagnosis not present

## 2017-05-22 DIAGNOSIS — C3482 Malignant neoplasm of overlapping sites of left bronchus and lung: Secondary | ICD-10-CM | POA: Diagnosis not present

## 2017-05-25 DIAGNOSIS — C3482 Malignant neoplasm of overlapping sites of left bronchus and lung: Secondary | ICD-10-CM | POA: Diagnosis not present

## 2017-05-25 DIAGNOSIS — Z510 Encounter for antineoplastic radiation therapy: Secondary | ICD-10-CM | POA: Diagnosis not present

## 2017-05-26 DIAGNOSIS — C3482 Malignant neoplasm of overlapping sites of left bronchus and lung: Secondary | ICD-10-CM | POA: Diagnosis not present

## 2017-05-26 DIAGNOSIS — Z510 Encounter for antineoplastic radiation therapy: Secondary | ICD-10-CM | POA: Diagnosis not present

## 2017-05-27 DIAGNOSIS — Z510 Encounter for antineoplastic radiation therapy: Secondary | ICD-10-CM | POA: Diagnosis not present

## 2017-05-27 DIAGNOSIS — C3482 Malignant neoplasm of overlapping sites of left bronchus and lung: Secondary | ICD-10-CM | POA: Diagnosis not present

## 2017-05-28 DIAGNOSIS — Z510 Encounter for antineoplastic radiation therapy: Secondary | ICD-10-CM | POA: Diagnosis not present

## 2017-05-28 DIAGNOSIS — C3482 Malignant neoplasm of overlapping sites of left bronchus and lung: Secondary | ICD-10-CM | POA: Diagnosis not present

## 2017-05-29 DIAGNOSIS — C3482 Malignant neoplasm of overlapping sites of left bronchus and lung: Secondary | ICD-10-CM | POA: Diagnosis not present

## 2017-05-29 DIAGNOSIS — Z510 Encounter for antineoplastic radiation therapy: Secondary | ICD-10-CM | POA: Diagnosis not present

## 2017-06-01 ENCOUNTER — Ambulatory Visit (HOSPITAL_COMMUNITY)
Admission: RE | Admit: 2017-06-01 | Discharge: 2017-06-01 | Disposition: A | Discharge status: Home / Self Care | Payer: Medicare Other | Source: Ambulatory Visit | Attending: Hematology & Oncology | Admitting: Hematology & Oncology

## 2017-06-01 ENCOUNTER — Other Ambulatory Visit (HOSPITAL_BASED_OUTPATIENT_CLINIC_OR_DEPARTMENT_OTHER): Payer: Medicare Other

## 2017-06-01 ENCOUNTER — Ambulatory Visit (HOSPITAL_BASED_OUTPATIENT_CLINIC_OR_DEPARTMENT_OTHER): Payer: Medicare Other | Admitting: Hematology & Oncology

## 2017-06-01 ENCOUNTER — Ambulatory Visit (HOSPITAL_BASED_OUTPATIENT_CLINIC_OR_DEPARTMENT_OTHER): Payer: Medicare Other

## 2017-06-01 VITALS — BP 135/78 | HR 86 | Temp 98.0°F | Resp 20 | Wt 180.0 lb

## 2017-06-01 DIAGNOSIS — Z510 Encounter for antineoplastic radiation therapy: Secondary | ICD-10-CM | POA: Diagnosis not present

## 2017-06-01 DIAGNOSIS — D6481 Anemia due to antineoplastic chemotherapy: Secondary | ICD-10-CM | POA: Diagnosis not present

## 2017-06-01 DIAGNOSIS — C3402 Malignant neoplasm of left main bronchus: Secondary | ICD-10-CM

## 2017-06-01 DIAGNOSIS — C3492 Malignant neoplasm of unspecified part of left bronchus or lung: Secondary | ICD-10-CM

## 2017-06-01 DIAGNOSIS — D500 Iron deficiency anemia secondary to blood loss (chronic): Secondary | ICD-10-CM

## 2017-06-01 DIAGNOSIS — X58XXXA Exposure to other specified factors, initial encounter: Secondary | ICD-10-CM | POA: Diagnosis not present

## 2017-06-01 DIAGNOSIS — T451X5A Adverse effect of antineoplastic and immunosuppressive drugs, initial encounter: Principal | ICD-10-CM

## 2017-06-01 DIAGNOSIS — C3482 Malignant neoplasm of overlapping sites of left bronchus and lung: Secondary | ICD-10-CM | POA: Diagnosis not present

## 2017-06-01 LAB — IRON AND TIBC
%SAT: 10 % — AB (ref 20–55)
Iron: 16 ug/dL — ABNORMAL LOW (ref 42–163)
TIBC: 170 ug/dL — ABNORMAL LOW (ref 202–409)
UIBC: 154 ug/dL (ref 117–376)

## 2017-06-01 LAB — CBC WITH DIFFERENTIAL (CANCER CENTER ONLY)
BASO#: 0 10*3/uL (ref 0.0–0.2)
BASO%: 0.4 % (ref 0.0–2.0)
EOS ABS: 0 10*3/uL (ref 0.0–0.5)
EOS%: 0.1 % (ref 0.0–7.0)
HCT: 25.2 % — ABNORMAL LOW (ref 38.7–49.9)
HEMOGLOBIN: 8.2 g/dL — AB (ref 13.0–17.1)
LYMPH#: 0.5 10*3/uL — ABNORMAL LOW (ref 0.9–3.3)
LYMPH%: 5.3 % — AB (ref 14.0–48.0)
MCH: 28.2 pg (ref 28.0–33.4)
MCHC: 32.5 g/dL (ref 32.0–35.9)
MCV: 87 fL (ref 82–98)
MONO#: 1.3 10*3/uL — ABNORMAL HIGH (ref 0.1–0.9)
MONO%: 14 % — AB (ref 0.0–13.0)
NEUT%: 80.2 % — ABNORMAL HIGH (ref 40.0–80.0)
NEUTROS ABS: 7.2 10*3/uL — AB (ref 1.5–6.5)
PLATELETS: 411 10*3/uL — AB (ref 145–400)
RBC: 2.91 10*6/uL — AB (ref 4.20–5.70)
RDW: 17.2 % — ABNORMAL HIGH (ref 11.1–15.7)
WBC: 9 10*3/uL (ref 4.0–10.0)

## 2017-06-01 LAB — CMP (CANCER CENTER ONLY)
ALT(SGPT): 34 U/L (ref 10–47)
AST: 29 U/L (ref 11–38)
Albumin: 2.7 g/dL — ABNORMAL LOW (ref 3.3–5.5)
Alkaline Phosphatase: 86 U/L — ABNORMAL HIGH (ref 26–84)
BILIRUBIN TOTAL: 0.6 mg/dL (ref 0.20–1.60)
BUN: 12 mg/dL (ref 7–22)
CHLORIDE: 99 meq/L (ref 98–108)
CO2: 29 mEq/L (ref 18–33)
CREATININE: 1.3 mg/dL — AB (ref 0.6–1.2)
Calcium: 8.8 mg/dL (ref 8.0–10.3)
Glucose, Bld: 102 mg/dL (ref 73–118)
Potassium: 4.3 mEq/L (ref 3.3–4.7)
SODIUM: 135 meq/L (ref 128–145)
TOTAL PROTEIN: 7.5 g/dL (ref 6.4–8.1)

## 2017-06-01 LAB — FERRITIN: Ferritin: 855 ng/ml — ABNORMAL HIGH (ref 22–316)

## 2017-06-01 LAB — ABO/RH: ABO/RH(D): O POS

## 2017-06-01 MED ORDER — HEPARIN SOD (PORK) LOCK FLUSH 100 UNIT/ML IV SOLN
500.0000 [IU] | Freq: Once | INTRAVENOUS | Status: AC
  Administered 2017-06-01: 500 [IU] via INTRAVENOUS
  Filled 2017-06-01: qty 5

## 2017-06-01 MED ORDER — SODIUM CHLORIDE 0.9% FLUSH
10.0000 mL | INTRAVENOUS | Status: DC | PRN
  Administered 2017-06-01: 10 mL via INTRAVENOUS
  Filled 2017-06-01: qty 10

## 2017-06-01 MED ORDER — SUCRALFATE 1 GM/10ML PO SUSP: 1.0000 g | Freq: Three times a day (TID) | ORAL | 2 refills | Status: DC

## 2017-06-01 NOTE — Addendum Note (Signed)
Addended by: Burney Gauze R on: 06/01/2017 03:23 PM   Modules accepted: Orders

## 2017-06-01 NOTE — Patient Instructions (Signed)
Implanted Port Insertion, Care After °This sheet gives you information about how to care for yourself after your procedure. Your health care provider may also give you more specific instructions. If you have problems or questions, contact your health care provider. °What can I expect after the procedure? °After your procedure, it is common to have: °· Discomfort at the port insertion site. °· Bruising on the skin over the port. This should improve over 3-4 days. ° °Follow these instructions at home: °Port care °· After your port is placed, you will get a manufacturer's information card. The card has information about your port. Keep this card with you at all times. °· Take care of the port as told by your health care provider. Ask your health care provider if you or a family member can get training for taking care of the port at home. A home health care nurse may also take care of the port. °· Make sure to remember what type of port you have. °Incision care °· Follow instructions from your health care provider about how to take care of your port insertion site. Make sure you: °? Wash your hands with soap and water before you change your bandage (dressing). If soap and water are not available, use hand sanitizer. °? Change your dressing as told by your health care provider. °? Leave stitches (sutures), skin glue, or adhesive strips in place. These skin closures may need to stay in place for 2 weeks or longer. If adhesive strip edges start to loosen and curl up, you may trim the loose edges. Do not remove adhesive strips completely unless your health care provider tells you to do that. °· Check your port insertion site every day for signs of infection. Check for: °? More redness, swelling, or pain. °? More fluid or blood. °? Warmth. °? Pus or a bad smell. °General instructions °· Do not take baths, swim, or use a hot tub until your health care provider approves. °· Do not lift anything that is heavier than 10 lb (4.5  kg) for a week, or as told by your health care provider. °· Ask your health care provider when it is okay to: °? Return to work or school. °? Resume usual physical activities or sports. °· Do not drive for 24 hours if you were given a medicine to help you relax (sedative). °· Take over-the-counter and prescription medicines only as told by your health care provider. °· Wear a medical alert bracelet in case of an emergency. This will tell any health care providers that you have a port. °· Keep all follow-up visits as told by your health care provider. This is important. °Contact a health care provider if: °· You cannot flush your port with saline as directed, or you cannot draw blood from the port. °· You have a fever or chills. °· You have more redness, swelling, or pain around your port insertion site. °· You have more fluid or blood coming from your port insertion site. °· Your port insertion site feels warm to the touch. °· You have pus or a bad smell coming from the port insertion site. °Get help right away if: °· You have chest pain or shortness of breath. °· You have bleeding from your port that you cannot control. °Summary °· Take care of the port as told by your health care provider. °· Change your dressing as told by your health care provider. °· Keep all follow-up visits as told by your health care provider. °  This information is not intended to replace advice given to you by your health care provider. Make sure you discuss any questions you have with your health care provider. °Document Released: 07/20/2013 Document Revised: 08/20/2016 Document Reviewed: 08/20/2016 °Elsevier Interactive Patient Education © 2017 [employer] ° °

## 2017-06-01 NOTE — Progress Notes (Signed)
Hematology and Oncology Follow Up Visit  Harry Hinton [id number] 20-Nov-1932 83 y.o. 06/01/2017   Principle Diagnosis:   Locally advanced non-small cell lung cancer of the left lung - Stage IIIB (T3N2M0)  - SCCa  Current Therapy:    Carboplatin/Etoposide - s/p cycle #2 Radiation Therapy to the chest - concurrent - completed 06/01/2017    Interim History:  Harry Hinton is in for his follow-up.he finished up his radiation therapy today. 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 He is eating. It is more difficult for him to eat. We will try him on some Carafate. This may help a little bit.  He has had no problems with bleeding. His been no problems with bowels or bladder. He has had little bit of leg swelling.  He does look alert but pale area and his hemoglobin was only 8.2 today. As such, I think that he will definitely benefit from a blood transfusion.  He's had no fever. He's had no hemoptysis.  He really has not gone anywhere. He and his wife are a little bit worried about getting an infection. I told him that his immune system is actually doing fairly well.   Overall, his performance status is ECOG 1.   Medications:  Current Outpatient Prescriptions:  .  nystatin (MYCOSTATIN) 100000 UNIT/ML suspension, Take 5 mLs by mouth 4 (four) times daily., Disp: , Rfl:  .  aspirin EC 81 MG tablet, Take 81 mg by mouth daily., Disp: , Rfl:  .  carvedilol (COREG) 3.125 MG tablet, Take 1 tablet (3.125 mg total) by mouth 2 (two) times daily with a meal., Disp: 60 tablet, Rfl: 0 .  lidocaine-prilocaine (EMLA) cream, Apply to affected area once, Disp: 30 g, Rfl: 3 .  LORazepam (ATIVAN) 0.5 MG tablet, Take 1 tablet (0.5 mg total) by mouth every 6 (six) hours as needed (Nausea or vomiting)., Disp: 30 tablet, Rfl: 0 .  Multiple Vitamins-Minerals (CENTRUM SILVER 50+MEN) TABS, Take 1 tablet by mouth daily., Disp: , Rfl:  .  omeprazole (PRILOSEC) 40 MG capsule, Take 40 mg by mouth., Disp: , Rfl:    .  ondansetron (ZOFRAN) 8 MG tablet, Take 1 tablet (8 mg total) by mouth 2 (two) times daily as needed for refractory nausea / vomiting. Start on day 3 after carboplatin chemo., Disp: 30 tablet, Rfl: 1 .  prochlorperazine (COMPAZINE) 10 MG tablet, Take 1 tablet (10 mg total) by mouth every 6 (six) hours as needed (Nausea or vomiting)., Disp: 30 tablet, Rfl: 1 .  vitamin E 200 UNIT capsule, Take 200 Units by mouth daily., Disp: , Rfl:   Allergies:  Allergies  Allergen Reactions  . No Known Allergies     Past Medical History, Surgical history, Social history, and Family History were reviewed and updated.  Review of Systems:  As above   Physical Exam: 98.1,  71, 154/68,  Wt is 184lb  Wt Readings from Last 3 Encounters:  05/04/17 188 lb 4 oz (85.4 kg)  03/12/17 184 lb 4.8 oz (83.6 kg)     Well-developed and well-nourished white male in no obvious distress. Head and neck exam shows no ocular or oral lesions. He has no palpable cervical or supraclavicular lymph nodes. Lungs are clear bilaterally. Cardiac exam regular rate and rhythm with no murmurs, rubs or bruits. Abdomen is soft. Has good bowel sounds. There is no fluid wave. There is no palpable liver or spleen tip. Back exam shows no tenderness over the spine, ribs or hips. Extremities shows  no clubbing, cyanosis or edema. He actually has some slight pitting edema in his lower legs. Neurological exam shows no focal neurological deficits. Skin exam shows no rashes, ecchymoses or petechia. He does have some paleness to his overall skin tone.  Lab Results  Component Value Date   WBC 9.0 06/01/2017   HGB 8.2 (L) 06/01/2017   HCT 25.2 (L) 06/01/2017   MCV 87 06/01/2017   PLT 411 (H) 06/01/2017     Chemistry      Component Value Date/Time   NA 130 05/04/2017 1356   K 4.5 05/04/2017 1356   CL 93 (L) 05/04/2017 1356   CO2 30 05/04/2017 1356   BUN 17 05/04/2017 1356   CREATININE 1.5 (H) 05/04/2017 1356      Component Value  Date/Time   CALCIUM 8.4 05/04/2017 1356   ALKPHOS 80 05/04/2017 1356   AST 28 05/04/2017 1356   ALT 24 05/04/2017 1356   BILITOT 0.50 05/04/2017 1356         Impression and Plan: Harry Hinton is a 83 year old white male. He has local advanced non-small cell lung cancer of the left lung. Clinically, this looks like stage IIIB.  I think that we will need to transfuse him. I think with his hemoglobin of 8.2, and his symptoms, the transfusion will be the best way to try to get him to feel better and to heal up.  I talked to he, his wife and daughter for about 30 minutes. I explained to them why thought that it would be beneficial to transfuse him. I told him of the transfusion was safe.  He will definitely need to have the PET scan done in about 6 weeks or so. At this time, all of the radiation should be out of his system.  Given that he has locally advanced disease, the FDA hasn't now approved the use of immunotherapy after chemotherapy/radiation therapy. I think that this will be an option for [place].   I'll plan to see him back after he has his PET scan.   Volanda Napoleon, MD 8/20/201811:08 AM

## 2017-06-02 ENCOUNTER — Ambulatory Visit (HOSPITAL_BASED_OUTPATIENT_CLINIC_OR_DEPARTMENT_OTHER): Payer: Medicare Other

## 2017-06-02 ENCOUNTER — Other Ambulatory Visit: Payer: Self-pay | Admitting: Family

## 2017-06-02 VITALS — BP 136/67 | HR 77 | Temp 97.7°F | Resp 18

## 2017-06-02 DIAGNOSIS — D500 Iron deficiency anemia secondary to blood loss (chronic): Secondary | ICD-10-CM | POA: Diagnosis not present

## 2017-06-02 DIAGNOSIS — C3402 Malignant neoplasm of left main bronchus: Secondary | ICD-10-CM

## 2017-06-02 DIAGNOSIS — T148XXA Other injury of unspecified body region, initial encounter: Secondary | ICD-10-CM | POA: Diagnosis not present

## 2017-06-02 DIAGNOSIS — R52 Pain, unspecified: Secondary | ICD-10-CM

## 2017-06-02 DIAGNOSIS — D6481 Anemia due to antineoplastic chemotherapy: Secondary | ICD-10-CM | POA: Diagnosis not present

## 2017-06-02 DIAGNOSIS — S80212A Abrasion, left knee, initial encounter: Secondary | ICD-10-CM | POA: Diagnosis not present

## 2017-06-02 DIAGNOSIS — S20219A Contusion of unspecified front wall of thorax, initial encounter: Secondary | ICD-10-CM | POA: Diagnosis not present

## 2017-06-02 DIAGNOSIS — T451X5A Adverse effect of antineoplastic and immunosuppressive drugs, initial encounter: Secondary | ICD-10-CM | POA: Diagnosis not present

## 2017-06-02 DIAGNOSIS — R079 Chest pain, unspecified: Secondary | ICD-10-CM | POA: Diagnosis not present

## 2017-06-02 DIAGNOSIS — M546 Pain in thoracic spine: Secondary | ICD-10-CM | POA: Diagnosis not present

## 2017-06-02 DIAGNOSIS — Z041 Encounter for examination and observation following transport accident: Secondary | ICD-10-CM | POA: Diagnosis not present

## 2017-06-02 DIAGNOSIS — S80211A Abrasion, right knee, initial encounter: Secondary | ICD-10-CM | POA: Diagnosis not present

## 2017-06-02 DIAGNOSIS — S299XXA Unspecified injury of thorax, initial encounter: Secondary | ICD-10-CM | POA: Diagnosis not present

## 2017-06-02 DIAGNOSIS — S39012A Strain of muscle, fascia and tendon of lower back, initial encounter: Secondary | ICD-10-CM | POA: Diagnosis not present

## 2017-06-02 DIAGNOSIS — D649 Anemia, unspecified: Secondary | ICD-10-CM | POA: Diagnosis not present

## 2017-06-02 LAB — PREPARE RBC (CROSSMATCH)

## 2017-06-02 MED ORDER — SODIUM CHLORIDE 0.9 % IV SOLN
510.0000 mg | Freq: Once | INTRAVENOUS | Status: AC
  Administered 2017-06-02: 510 mg via INTRAVENOUS
  Filled 2017-06-02: qty 17

## 2017-06-02 MED ORDER — ACETAMINOPHEN 325 MG PO TABS
650.0000 mg | ORAL_TABLET | Freq: Once | ORAL | Status: AC
  Administered 2017-06-02: 650 mg via ORAL

## 2017-06-02 MED ORDER — SODIUM CHLORIDE 0.9% FLUSH
10.0000 mL | INTRAVENOUS | Status: AC | PRN
  Administered 2017-06-02: 10 mL
  Filled 2017-06-02: qty 10

## 2017-06-02 MED ORDER — HEPARIN SOD (PORK) LOCK FLUSH 100 UNIT/ML IV SOLN
500.0000 [IU] | Freq: Every day | INTRAVENOUS | Status: AC | PRN
  Administered 2017-06-02: 500 [IU]
  Filled 2017-06-02: qty 5

## 2017-06-02 MED ORDER — FUROSEMIDE 10 MG/ML IJ SOLN: 20.0000 mg | Freq: Once | INTRAMUSCULAR | Status: DC

## 2017-06-02 MED ORDER — DIPHENHYDRAMINE HCL 25 MG PO CAPS
25.0000 mg | ORAL_CAPSULE | Freq: Once | ORAL | Status: AC
  Administered 2017-06-02: 25 mg via ORAL

## 2017-06-02 MED ORDER — SODIUM CHLORIDE 0.9 % IV SOLN
250.0000 mL | Freq: Once | INTRAVENOUS | Status: AC
  Administered 2017-06-02: 250 mL via INTRAVENOUS

## 2017-06-02 MED ORDER — KETOROLAC TROMETHAMINE 15 MG/ML IJ SOLN
INTRAMUSCULAR | Status: AC
  Filled 2017-06-02: qty 2

## 2017-06-02 MED ORDER — KETOROLAC TROMETHAMINE 15 MG/ML IJ SOLN
30.0000 mg | Freq: Once | INTRAMUSCULAR | Status: AC
  Administered 2017-06-02: 30 mg via INTRAVENOUS

## 2017-06-02 MED ORDER — DIPHENHYDRAMINE HCL 25 MG PO CAPS
ORAL_CAPSULE | ORAL | Status: AC
  Filled 2017-06-02: qty 1

## 2017-06-02 MED ORDER — ACETAMINOPHEN 325 MG PO TABS
ORAL_TABLET | ORAL | Status: AC
  Filled 2017-06-02: qty 2

## 2017-06-03 ENCOUNTER — Encounter: Payer: Self-pay | Admitting: Hematology & Oncology

## 2017-06-03 LAB — BPAM RBC
BLOOD PRODUCT EXPIRATION DATE: 201809182359
Blood Product Expiration Date: 201809182359
ISSUE DATE / TIME: 201808211045
ISSUE DATE / TIME: 201808211045
Unit Type and Rh: 5100
Unit Type and Rh: 5100

## 2017-06-03 LAB — TYPE AND SCREEN
ABO/RH(D): O POS
Antibody Screen: NEGATIVE
UNIT DIVISION: 0
Unit division: 0

## 2017-06-09 DIAGNOSIS — S20219D Contusion of unspecified front wall of thorax, subsequent encounter: Secondary | ICD-10-CM | POA: Diagnosis not present

## 2017-06-09 DIAGNOSIS — S61412S Laceration without foreign body of left hand, sequela: Secondary | ICD-10-CM | POA: Diagnosis not present

## 2017-06-09 DIAGNOSIS — S41112D Laceration without foreign body of left upper arm, subsequent encounter: Secondary | ICD-10-CM | POA: Diagnosis not present

## 2017-06-09 DIAGNOSIS — M546 Pain in thoracic spine: Secondary | ICD-10-CM | POA: Diagnosis not present

## 2017-07-02 DIAGNOSIS — Z510 Encounter for antineoplastic radiation therapy: Secondary | ICD-10-CM | POA: Diagnosis not present

## 2017-07-02 DIAGNOSIS — C3482 Malignant neoplasm of overlapping sites of left bronchus and lung: Secondary | ICD-10-CM | POA: Diagnosis not present

## 2017-07-20 ENCOUNTER — Ambulatory Visit (HOSPITAL_COMMUNITY)
Admission: RE | Admit: 2017-07-20 | Discharge: 2017-07-20 | Disposition: A | Discharge status: Home / Self Care | Payer: Medicare Other | Source: Ambulatory Visit | Attending: Hematology & Oncology | Admitting: Hematology & Oncology

## 2017-07-20 ENCOUNTER — Encounter: Payer: Self-pay | Admitting: Hematology & Oncology

## 2017-07-20 DIAGNOSIS — D500 Iron deficiency anemia secondary to blood loss (chronic): Secondary | ICD-10-CM | POA: Diagnosis not present

## 2017-07-20 DIAGNOSIS — T451X5A Adverse effect of antineoplastic and immunosuppressive drugs, initial encounter: Secondary | ICD-10-CM | POA: Insufficient documentation

## 2017-07-20 DIAGNOSIS — N210 Calculus in bladder: Secondary | ICD-10-CM | POA: Insufficient documentation

## 2017-07-20 DIAGNOSIS — K5730 Diverticulosis of large intestine without perforation or abscess without bleeding: Secondary | ICD-10-CM | POA: Diagnosis not present

## 2017-07-20 DIAGNOSIS — I700 Atherosclerosis of aorta: Secondary | ICD-10-CM | POA: Diagnosis not present

## 2017-07-20 DIAGNOSIS — I2510 Atherosclerotic heart disease of native coronary artery without angina pectoris: Secondary | ICD-10-CM | POA: Insufficient documentation

## 2017-07-20 DIAGNOSIS — C3490 Malignant neoplasm of unspecified part of unspecified bronchus or lung: Secondary | ICD-10-CM | POA: Diagnosis not present

## 2017-07-20 DIAGNOSIS — K7689 Other specified diseases of liver: Secondary | ICD-10-CM | POA: Diagnosis not present

## 2017-07-20 DIAGNOSIS — I080 Rheumatic disorders of both mitral and aortic valves: Secondary | ICD-10-CM | POA: Insufficient documentation

## 2017-07-20 DIAGNOSIS — J432 Centrilobular emphysema: Secondary | ICD-10-CM | POA: Insufficient documentation

## 2017-07-20 DIAGNOSIS — C3402 Malignant neoplasm of left main bronchus: Secondary | ICD-10-CM | POA: Insufficient documentation

## 2017-07-20 DIAGNOSIS — I77811 Abdominal aortic ectasia: Secondary | ICD-10-CM | POA: Diagnosis not present

## 2017-07-20 DIAGNOSIS — D6481 Anemia due to antineoplastic chemotherapy: Secondary | ICD-10-CM | POA: Insufficient documentation

## 2017-07-20 DIAGNOSIS — J438 Other emphysema: Secondary | ICD-10-CM | POA: Insufficient documentation

## 2017-07-20 LAB — GLUCOSE, CAPILLARY: Glucose-Capillary: 102 mg/dL — ABNORMAL HIGH (ref 65–99)

## 2017-07-20 MED ORDER — FLUDEOXYGLUCOSE F - 18 (FDG) INJECTION
8.9600 | Freq: Once | INTRAVENOUS | Status: AC | PRN
  Administered 2017-07-20: 8.96 via INTRAVENOUS

## 2017-07-23 ENCOUNTER — Ambulatory Visit (HOSPITAL_BASED_OUTPATIENT_CLINIC_OR_DEPARTMENT_OTHER): Payer: Medicare Other | Admitting: Hematology & Oncology

## 2017-07-23 ENCOUNTER — Other Ambulatory Visit (HOSPITAL_BASED_OUTPATIENT_CLINIC_OR_DEPARTMENT_OTHER): Payer: Medicare Other

## 2017-07-23 ENCOUNTER — Ambulatory Visit (HOSPITAL_BASED_OUTPATIENT_CLINIC_OR_DEPARTMENT_OTHER): Payer: Medicare Other

## 2017-07-23 VITALS — BP 164/86 | HR 96 | Temp 97.5°F | Resp 20 | Wt 178.5 lb

## 2017-07-23 DIAGNOSIS — C3492 Malignant neoplasm of unspecified part of left bronchus or lung: Secondary | ICD-10-CM

## 2017-07-23 DIAGNOSIS — R160 Hepatomegaly, not elsewhere classified: Secondary | ICD-10-CM

## 2017-07-23 DIAGNOSIS — R932 Abnormal findings on diagnostic imaging of liver and biliary tract: Secondary | ICD-10-CM

## 2017-07-23 DIAGNOSIS — T451X5A Adverse effect of antineoplastic and immunosuppressive drugs, initial encounter: Principal | ICD-10-CM

## 2017-07-23 DIAGNOSIS — C3402 Malignant neoplasm of left main bronchus: Secondary | ICD-10-CM

## 2017-07-23 DIAGNOSIS — Z923 Personal history of irradiation: Secondary | ICD-10-CM | POA: Diagnosis not present

## 2017-07-23 DIAGNOSIS — D500 Iron deficiency anemia secondary to blood loss (chronic): Secondary | ICD-10-CM

## 2017-07-23 DIAGNOSIS — D6481 Anemia due to antineoplastic chemotherapy: Secondary | ICD-10-CM

## 2017-07-23 LAB — CMP (CANCER CENTER ONLY)
ALK PHOS: 101 U/L — AB (ref 26–84)
ALT(SGPT): 24 U/L (ref 10–47)
AST: 25 U/L (ref 11–38)
Albumin: 3 g/dL — ABNORMAL LOW (ref 3.3–5.5)
BUN: 18 mg/dL (ref 7–22)
CALCIUM: 9.2 mg/dL (ref 8.0–10.3)
CHLORIDE: 99 meq/L (ref 98–108)
CO2: 28 mEq/L (ref 18–33)
Creat: 0.9 mg/dl (ref 0.6–1.2)
Glucose, Bld: 106 mg/dL (ref 73–118)
Potassium: 4 mEq/L (ref 3.3–4.7)
Sodium: 137 mEq/L (ref 128–145)
Total Bilirubin: 0.5 mg/dl (ref 0.20–1.60)
Total Protein: 8 g/dL (ref 6.4–8.1)

## 2017-07-23 LAB — CBC WITH DIFFERENTIAL (CANCER CENTER ONLY)
BASO#: 0.1 10*3/uL (ref 0.0–0.2)
BASO%: 0.7 % (ref 0.0–2.0)
EOS%: 7.3 % — AB (ref 0.0–7.0)
Eosinophils Absolute: 0.5 10*3/uL (ref 0.0–0.5)
HEMATOCRIT: 33.5 % — AB (ref 38.7–49.9)
HGB: 11.2 g/dL — ABNORMAL LOW (ref 13.0–17.1)
LYMPH#: 1.7 10*3/uL (ref 0.9–3.3)
LYMPH%: 23.5 % (ref 14.0–48.0)
MCH: 30.4 pg (ref 28.0–33.4)
MCHC: 33.4 g/dL (ref 32.0–35.9)
MCV: 91 fL (ref 82–98)
MONO#: 0.8 10*3/uL (ref 0.1–0.9)
MONO%: 11.7 % (ref 0.0–13.0)
NEUT%: 56.8 % (ref 40.0–80.0)
NEUTROS ABS: 4 10*3/uL (ref 1.5–6.5)
PLATELETS: 216 10*3/uL (ref 145–400)
RBC: 3.68 10*6/uL — AB (ref 4.20–5.70)
RDW: 17.4 % — AB (ref 11.1–15.7)
WBC: 7 10*3/uL (ref 4.0–10.0)

## 2017-07-23 LAB — IRON AND TIBC
%SAT: 15 % — AB (ref 20–55)
Iron: 33 ug/dL — ABNORMAL LOW (ref 42–163)
TIBC: 216 ug/dL (ref 202–409)
UIBC: 183 ug/dL (ref 117–376)

## 2017-07-23 LAB — FERRITIN: Ferritin: 814 ng/ml — ABNORMAL HIGH (ref 22–316)

## 2017-07-23 MED ORDER — HEPARIN SOD (PORK) LOCK FLUSH 100 UNIT/ML IV SOLN
500.0000 [IU] | Freq: Once | INTRAVENOUS | Status: AC | PRN
  Administered 2017-07-23: 500 [IU] via INTRAVENOUS
  Filled 2017-07-23: qty 5

## 2017-07-23 MED ORDER — SODIUM CHLORIDE 0.9% FLUSH
10.0000 mL | INTRAVENOUS | Status: DC | PRN
  Administered 2017-07-23: 10 mL via INTRAVENOUS
  Filled 2017-07-23: qty 10

## 2017-07-23 NOTE — Patient Instructions (Signed)
Implanted Port Home Guide An implanted port is a type of central line that is placed under the skin. Central lines are used to provide IV access when treatment or nutrition needs to be given through a person's veins. Implanted ports are used for long-term IV access. An implanted port may be placed because:  You need IV medicine that would be irritating to the small veins in your hands or arms.  You need long-term IV medicines, such as antibiotics.  You need IV nutrition for a long period.  You need frequent blood draws for lab tests.  You need dialysis.  Implanted ports are usually placed in the chest area, but they can also be placed in the upper arm, the abdomen, or the leg. An implanted port has two main parts:  Reservoir. The reservoir is round and will appear as a small, raised area under your skin. The reservoir is the part where a needle is inserted to give medicines or draw blood.  Catheter. The catheter is a thin, flexible tube that extends from the reservoir. The catheter is placed into a large vein. Medicine that is inserted into the reservoir goes into the catheter and then into the vein.  How will I care for my incision site? Do not get the incision site wet. Bathe or shower as directed by your health care provider. How is my port accessed? Special steps must be taken to access the port:  Before the port is accessed, a numbing cream can be placed on the skin. This helps numb the skin over the port site.  Your health care provider uses a sterile technique to access the port. ? Your health care provider must put on a mask and sterile gloves. ? The skin over your port is cleaned carefully with an antiseptic and allowed to dry. ? The port is gently pinched between sterile gloves, and a needle is inserted into the port.  Only "non-coring" port needles should be used to access the port. Once the port is accessed, a blood return should be checked. This helps ensure that the port  is in the vein and is not clogged.  If your port needs to remain accessed for a constant infusion, a clear (transparent) bandage will be placed over the needle site. The bandage and needle will need to be changed every week, or as directed by your health care provider.  Keep the bandage covering the needle clean and dry. Do not get it wet. Follow your health care provider's instructions on how to take a shower or bath while the port is accessed.  If your port does not need to stay accessed, no bandage is needed over the port.  What is flushing? Flushing helps keep the port from getting clogged. Follow your health care provider's instructions on how and when to flush the port. Ports are usually flushed with saline solution or a medicine called heparin. The need for flushing will depend on how the port is used.  If the port is used for intermittent medicines or blood draws, the port will need to be flushed: ? After medicines have been given. ? After blood has been drawn. ? As part of routine maintenance.  If a constant infusion is running, the port may not need to be flushed.  How long will my port stay implanted? The port can stay in for as long as your health care provider thinks it is needed. When it is time for the port to come out, surgery will be   done to remove it. The procedure is similar to the one performed when the port was put in. When should I seek immediate medical care? When you have an implanted port, you should seek immediate medical care if:  You notice a bad smell coming from the incision site.  You have swelling, redness, or drainage at the incision site.  You have more swelling or pain at the port site or the surrounding area.  You have a fever that is not controlled with medicine.  This information is not intended to replace advice given to you by your health care provider. Make sure you discuss any questions you have with your health care provider. Document  Released: 09/29/2005 Document Revised: 03/06/2016 Document Reviewed: 06/06/2013 Elsevier Interactive Patient Education  2017 [employer]  
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
## 2017-07-24 DIAGNOSIS — Z23 Encounter for immunization: Secondary | ICD-10-CM | POA: Diagnosis not present

## 2017-07-24 DIAGNOSIS — M546 Pain in thoracic spine: Secondary | ICD-10-CM | POA: Diagnosis not present

## 2017-07-24 DIAGNOSIS — I10 Essential (primary) hypertension: Secondary | ICD-10-CM | POA: Diagnosis not present

## 2017-07-24 DIAGNOSIS — R0789 Other chest pain: Secondary | ICD-10-CM | POA: Diagnosis not present

## 2017-07-25 ENCOUNTER — Ambulatory Visit (HOSPITAL_BASED_OUTPATIENT_CLINIC_OR_DEPARTMENT_OTHER)
Admission: RE | Admit: 2017-07-25 | Discharge: 2017-07-25 | Disposition: A | Discharge status: Home / Self Care | Payer: Medicare Other | Source: Ambulatory Visit | Attending: Hematology & Oncology | Admitting: Hematology & Oncology

## 2017-07-25 DIAGNOSIS — K314 Gastric diverticulum: Secondary | ICD-10-CM | POA: Diagnosis not present

## 2017-07-25 DIAGNOSIS — C3402 Malignant neoplasm of left main bronchus: Secondary | ICD-10-CM | POA: Insufficient documentation

## 2017-07-25 DIAGNOSIS — K769 Liver disease, unspecified: Secondary | ICD-10-CM | POA: Insufficient documentation

## 2017-07-25 DIAGNOSIS — R160 Hepatomegaly, not elsewhere classified: Secondary | ICD-10-CM

## 2017-07-25 MED ORDER — GADOBENATE DIMEGLUMINE 529 MG/ML IV SOLN
15.0000 mL | Freq: Once | INTRAVENOUS | Status: AC | PRN
  Administered 2017-07-25: 15 mL via INTRAVENOUS

## 2017-07-27 ENCOUNTER — Other Ambulatory Visit: Payer: Self-pay | Admitting: Hematology & Oncology

## 2017-07-27 ENCOUNTER — Other Ambulatory Visit: Payer: Self-pay | Admitting: Family

## 2017-07-27 DIAGNOSIS — C787 Secondary malignant neoplasm of liver and intrahepatic bile duct: Secondary | ICD-10-CM

## 2017-07-27 DIAGNOSIS — R160 Hepatomegaly, not elsewhere classified: Secondary | ICD-10-CM

## 2017-07-27 DIAGNOSIS — C3402 Malignant neoplasm of left main bronchus: Secondary | ICD-10-CM

## 2017-07-28 DIAGNOSIS — M546 Pain in thoracic spine: Secondary | ICD-10-CM | POA: Diagnosis not present

## 2017-07-30 DIAGNOSIS — M546 Pain in thoracic spine: Secondary | ICD-10-CM | POA: Diagnosis not present

## 2017-07-31 ENCOUNTER — Other Ambulatory Visit: Payer: Self-pay | Admitting: Radiology

## 2017-08-02 ENCOUNTER — Other Ambulatory Visit: Payer: Self-pay | Admitting: Radiology

## 2017-08-03 DIAGNOSIS — M546 Pain in thoracic spine: Secondary | ICD-10-CM | POA: Diagnosis not present

## 2017-08-04 ENCOUNTER — Other Ambulatory Visit: Payer: Self-pay | Admitting: Family

## 2017-08-04 ENCOUNTER — Ambulatory Visit (HOSPITAL_COMMUNITY)
Admission: RE | Admit: 2017-08-04 | Discharge: 2017-08-04 | Disposition: A | Discharge status: Home / Self Care | Payer: Medicare Other | Source: Ambulatory Visit | Attending: Hematology & Oncology | Admitting: Hematology & Oncology

## 2017-08-04 ENCOUNTER — Encounter (HOSPITAL_COMMUNITY): Payer: Self-pay

## 2017-08-04 ENCOUNTER — Ambulatory Visit (HOSPITAL_COMMUNITY)
Admission: RE | Admit: 2017-08-04 | Discharge: 2017-08-04 | Disposition: A | Discharge status: Home / Self Care | Payer: Medicare Other | Source: Ambulatory Visit | Attending: Family | Admitting: Family

## 2017-08-04 DIAGNOSIS — C3402 Malignant neoplasm of left main bronchus: Secondary | ICD-10-CM

## 2017-08-04 DIAGNOSIS — C787 Secondary malignant neoplasm of liver and intrahepatic bile duct: Secondary | ICD-10-CM

## 2017-08-04 DIAGNOSIS — Z85118 Personal history of other malignant neoplasm of bronchus and lung: Secondary | ICD-10-CM | POA: Diagnosis not present

## 2017-08-04 DIAGNOSIS — K7689 Other specified diseases of liver: Secondary | ICD-10-CM | POA: Diagnosis not present

## 2017-08-04 DIAGNOSIS — D6481 Anemia due to antineoplastic chemotherapy: Secondary | ICD-10-CM

## 2017-08-04 DIAGNOSIS — T451X5A Adverse effect of antineoplastic and immunosuppressive drugs, initial encounter: Secondary | ICD-10-CM

## 2017-08-04 LAB — CBC WITH DIFFERENTIAL/PLATELET
Basophils Absolute: 0 10*3/uL (ref 0.0–0.1)
Basophils Relative: 0 %
EOS PCT: 8 %
Eosinophils Absolute: 0.7 10*3/uL (ref 0.0–0.7)
HCT: 32 % — ABNORMAL LOW (ref 39.0–52.0)
Hemoglobin: 10.7 g/dL — ABNORMAL LOW (ref 13.0–17.0)
LYMPHS ABS: 2.2 10*3/uL (ref 0.7–4.0)
LYMPHS PCT: 28 %
MCH: 30.1 pg (ref 26.0–34.0)
MCHC: 33.4 g/dL (ref 30.0–36.0)
MCV: 89.9 fL (ref 78.0–100.0)
MONO ABS: 0.8 10*3/uL (ref 0.1–1.0)
Monocytes Relative: 10 %
Neutro Abs: 4.4 10*3/uL (ref 1.7–7.7)
Neutrophils Relative %: 54 %
PLATELETS: 238 10*3/uL (ref 150–400)
RBC: 3.56 MIL/uL — ABNORMAL LOW (ref 4.22–5.81)
RDW: 17.1 % — AB (ref 11.5–15.5)
WBC: 8.1 10*3/uL (ref 4.0–10.5)

## 2017-08-04 LAB — COMPREHENSIVE METABOLIC PANEL
ALT: 16 U/L — ABNORMAL LOW (ref 17–63)
AST: 20 U/L (ref 15–41)
Albumin: 3.1 g/dL — ABNORMAL LOW (ref 3.5–5.0)
Alkaline Phosphatase: 98 U/L (ref 38–126)
Anion gap: 9 (ref 5–15)
BUN: 20 mg/dL (ref 6–20)
CHLORIDE: 98 mmol/L — AB (ref 101–111)
CO2: 27 mmol/L (ref 22–32)
CREATININE: 0.99 mg/dL (ref 0.61–1.24)
Calcium: 9.1 mg/dL (ref 8.9–10.3)
Glucose, Bld: 104 mg/dL — ABNORMAL HIGH (ref 65–99)
POTASSIUM: 4.4 mmol/L (ref 3.5–5.1)
Sodium: 134 mmol/L — ABNORMAL LOW (ref 135–145)
Total Bilirubin: 0.6 mg/dL (ref 0.3–1.2)
Total Protein: 7.6 g/dL (ref 6.5–8.1)

## 2017-08-04 LAB — PROTIME-INR
INR: 0.97
PROTHROMBIN TIME: 12.8 s (ref 11.4–15.2)

## 2017-08-04 MED ORDER — SODIUM CHLORIDE 0.9 % IV SOLN
INTRAVENOUS | Status: DC
  Administered 2017-08-04: 12:00:00 via INTRAVENOUS

## 2017-08-04 MED ORDER — HEPARIN SOD (PORK) LOCK FLUSH 100 UNIT/ML IV SOLN: 250.0000 [IU] | INTRAVENOUS | Status: DC | PRN

## 2017-08-04 MED ORDER — HEPARIN SOD (PORK) LOCK FLUSH 100 UNIT/ML IV SOLN
500.0000 [IU] | INTRAVENOUS | Status: AC | PRN
  Administered 2017-08-04: 500 [IU]
  Filled 2017-08-04: qty 5

## 2017-08-04 MED ORDER — MIDAZOLAM HCL 2 MG/2ML IJ SOLN
INTRAMUSCULAR | Status: AC
  Filled 2017-08-04: qty 2

## 2017-08-04 MED ORDER — FENTANYL CITRATE (PF) 100 MCG/2ML IJ SOLN
INTRAMUSCULAR | Status: AC
  Filled 2017-08-04: qty 2

## 2017-08-04 MED ORDER — SODIUM CHLORIDE 0.9% FLUSH: 3.0000 mL | INTRAVENOUS | Status: DC | PRN

## 2017-08-04 MED ORDER — LIDOCAINE-EPINEPHRINE (PF) 2 %-1:200000 IJ SOLN
INTRAMUSCULAR | Status: AC
  Filled 2017-08-04: qty 20

## 2017-08-04 NOTE — Sedation Documentation (Signed)
MD unable to see mass well enough to bx.
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
## 2017-08-04 NOTE — Progress Notes (Signed)
Patient back from IR. Port flushed per protocol. D/c order from Dr. Pascal Lux. Patient and wife to follow up with Dr. Marin Olp. Home with wife at 1540.

## 2017-08-07 DIAGNOSIS — M546 Pain in thoracic spine: Secondary | ICD-10-CM | POA: Diagnosis not present

## 2017-08-10 DIAGNOSIS — M546 Pain in thoracic spine: Secondary | ICD-10-CM | POA: Diagnosis not present

## 2017-08-12 ENCOUNTER — Other Ambulatory Visit: Payer: Self-pay | Admitting: Hematology & Oncology

## 2017-08-12 DIAGNOSIS — R160 Hepatomegaly, not elsewhere classified: Secondary | ICD-10-CM

## 2017-08-12 DIAGNOSIS — C3402 Malignant neoplasm of left main bronchus: Secondary | ICD-10-CM

## 2017-08-17 ENCOUNTER — Other Ambulatory Visit: Payer: Self-pay | Admitting: Radiology

## 2017-08-18 ENCOUNTER — Ambulatory Visit: Payer: Medicare Other | Admitting: Hematology & Oncology

## 2017-08-18 ENCOUNTER — Ambulatory Visit (HOSPITAL_COMMUNITY)
Admission: RE | Admit: 2017-08-18 | Discharge: 2017-08-18 | Disposition: A | Discharge status: Home / Self Care | Payer: Medicare Other | Source: Ambulatory Visit | Attending: Hematology & Oncology | Admitting: Hematology & Oncology

## 2017-08-18 ENCOUNTER — Encounter (HOSPITAL_COMMUNITY): Payer: Self-pay

## 2017-08-18 ENCOUNTER — Other Ambulatory Visit: Payer: Medicare Other

## 2017-08-18 DIAGNOSIS — R160 Hepatomegaly, not elsewhere classified: Secondary | ICD-10-CM

## 2017-08-18 DIAGNOSIS — C3402 Malignant neoplasm of left main bronchus: Secondary | ICD-10-CM | POA: Diagnosis present

## 2017-08-18 DIAGNOSIS — C229 Malignant neoplasm of liver, not specified as primary or secondary: Secondary | ICD-10-CM | POA: Diagnosis not present

## 2017-08-18 LAB — CBC
HEMATOCRIT: 32.5 % — AB (ref 39.0–52.0)
HEMOGLOBIN: 10.4 g/dL — AB (ref 13.0–17.0)
MCH: 28.9 pg (ref 26.0–34.0)
MCHC: 32 g/dL (ref 30.0–36.0)
MCV: 90.3 fL (ref 78.0–100.0)
Platelets: 227 10*3/uL (ref 150–400)
RBC: 3.6 MIL/uL — AB (ref 4.22–5.81)
RDW: 15.8 % — ABNORMAL HIGH (ref 11.5–15.5)
WBC: 7.4 10*3/uL (ref 4.0–10.5)

## 2017-08-18 LAB — PROTIME-INR
INR: 1.11
Prothrombin Time: 14.2 seconds (ref 11.4–15.2)

## 2017-08-18 LAB — APTT: aPTT: 31 seconds (ref 24–36)

## 2017-08-18 MED ORDER — FENTANYL CITRATE (PF) 100 MCG/2ML IJ SOLN
INTRAMUSCULAR | Status: AC | PRN
  Administered 2017-08-18: 50 ug via INTRAVENOUS
  Administered 2017-08-18: 25 ug via INTRAVENOUS

## 2017-08-18 MED ORDER — FENTANYL CITRATE (PF) 100 MCG/2ML IJ SOLN
INTRAMUSCULAR | Status: AC
  Filled 2017-08-18: qty 2

## 2017-08-18 MED ORDER — MIDAZOLAM HCL 2 MG/2ML IJ SOLN
INTRAMUSCULAR | Status: AC
  Filled 2017-08-18: qty 2

## 2017-08-18 MED ORDER — LIDOCAINE HCL 1 % IJ SOLN
INTRAMUSCULAR | Status: AC
  Filled 2017-08-18: qty 20

## 2017-08-18 MED ORDER — HYDROCODONE-ACETAMINOPHEN 5-325 MG PO TABS
1.0000 | ORAL_TABLET | ORAL | Status: DC | PRN
  Filled 2017-08-18: qty 2

## 2017-08-18 MED ORDER — SODIUM CHLORIDE 0.9 % IV SOLN: INTRAVENOUS | Status: DC

## 2017-08-18 MED ORDER — HEPARIN SOD (PORK) LOCK FLUSH 100 UNIT/ML IV SOLN
500.0000 [IU] | Freq: Once | INTRAVENOUS | Status: AC
  Administered 2017-08-18: 500 [IU] via INTRAVENOUS
  Filled 2017-08-18: qty 5

## 2017-08-18 MED ORDER — SODIUM CHLORIDE 0.9 % IV SOLN
INTRAVENOUS | Status: AC | PRN
  Administered 2017-08-18: 10 mL/h via INTRAVENOUS

## 2017-08-18 MED ORDER — MIDAZOLAM HCL 2 MG/2ML IJ SOLN
INTRAMUSCULAR | Status: AC | PRN
  Administered 2017-08-18: 0.5 mg via INTRAVENOUS
  Administered 2017-08-18: 1 mg via INTRAVENOUS

## 2017-08-18 NOTE — H&P (Signed)
Chief Complaint: Patient was seen in consultation today for liver lesion biopsy at the request of Ennever,Peter R  Referring Physician(s): Ennever,Peter R  Supervising Physician: Arne Cleveland  Patient Status: [facility] - Out-pt  History of Present Illness: Dorsie Burich is a 83 y.o. male   Hx Lung Cancer Follow up PET 07/20/17 IMPRESSION: 1. Although today's study demonstrates some positive response to therapy in terms of decreased size of the primary lesion in the medial aspect of the superior segment of the left lower lobe, there is a new hypermetabolic lesion in segment 3 of the liver, highly concerning for a metastatic lesion. 2. New areas of hypermetabolism are also noted in the sternum, xiphoid process and several lower anterior ribs at or adjacent to the costochondral junctions. These appear to be associated with nondisplaced rib fractures. Findings suggest either recent trauma or recent CPR. No definite osseous lesions are identified. 3. Mild diffuse hypermetabolism throughout the esophagus, suggesting esophagitis. 4. Aortic atherosclerosis, in addition to left main and 3 vessel coronary artery disease. 5. There are calcifications of the aortic valve and mitral annulus. Echocardiographic correlation for evaluation of potential valvular dysfunction may be warranted if clinically indicated. 6. Moderate centrilobular and paraseptal emphysema. 7. Fusiform ectasia of the infrarenal abdominal aorta which measures up to 2.8 x 2.3 cm, similar to prior studies. 8. Colonic diverticulosis. 9. Nonobstructive calculus measuring 4 mm in the right side of the urinary bladder.  Was scheduled for US guided bx 10/23---unable to see lesion on [place]- Rescheduled for CT biopsy today    Past Medical History:  Diagnosis Date  . Cancer ([place])    lung CA  . Counseling regarding goals of care 03/26/2017  . Hearing loss   . Sinus problem     Past Surgical History:  Procedure  Laterality Date  . EYE SURGERY     bilateral cataracts in approx. 2016  . IR FLUORO GUIDE PORT INSERTION RIGHT  04/06/2017  . IR US GUIDE VASC ACCESS RIGHT  04/06/2017  . TONSILLECTOMY     age 85  . VASECTOMY      Allergies: Patient has no known allergies.  Medications: Prior to Admission medications   Medication Sig Start Date End Date Taking? Authorizing Provider  acetaminophen (TYLENOL) 500 MG tablet Take 1,000 mg by mouth every 6 (six) hours as needed for moderate pain or headache.   Yes [provider]  aspirin EC 81 MG tablet Take 81 mg by mouth at bedtime.    Yes [provider]  carvedilol (COREG) 3.125 MG tablet Take 1 tablet (3.125 mg total) by mouth 2 (two) times daily with a meal. 03/12/17  Yes Mikhail, Canadian, DO  lidocaine-prilocaine (EMLA) cream Apply to affected area once 04/06/17  Yes Ennever, Rudell Cobb, MD  Multiple Vitamins-Minerals (CENTRUM SILVER 50+MEN) TABS Take 1 tablet by mouth daily.   Yes [provider]  vitamin E 200 UNIT capsule Take 200 Units by mouth daily.   Yes [provider]  LORazepam (ATIVAN) 0.5 MG tablet Take 1 tablet (0.5 mg total) by mouth every 6 (six) hours as needed (Nausea or vomiting). Patient not taking: Reported on 07/23/2017 04/06/17   Volanda Napoleon, MD  ondansetron (ZOFRAN) 8 MG tablet Take 1 tablet (8 mg total) by mouth 2 (two) times daily as needed for refractory nausea / vomiting. Start on day 3 after carboplatin chemo. Patient not taking: Reported on 07/23/2017 04/06/17   Volanda Napoleon, MD  prochlorperazine (COMPAZINE) 10 MG tablet Take  1 tablet (10 mg total) by mouth every 6 (six) hours as needed (Nausea or vomiting). Patient not taking: Reported on 07/23/2017 04/06/17   Volanda Napoleon, MD  sucralfate (CARAFATE) 1 GM/10ML suspension Take 10 mLs (1 g total) by mouth 4 (four) times daily -  with meals and at bedtime. Patient not taking: Reported on 07/23/2017 06/01/17   Volanda Napoleon, MD      History reviewed. No pertinent family history.  Social History   Socioeconomic History  . Marital status: Married    Spouse name: None  . Number of children: None  . Years of education: None  . Highest education level: None  Social Needs  . Financial resource strain: None  . Food insecurity - worry: None  . Food insecurity - inability: None  . Transportation needs - medical: None  . Transportation needs - non-medical: None  Occupational History  . None  Tobacco Use  . Smoking status: Former Smoker    Types: Cigarettes    Last attempt to quit: 03/10/1987    Years since quitting: 30.4  . Smokeless tobacco: Never Used  Substance and Sexual Activity  . Alcohol use: Yes    Comment: occasional beer or occasional wine  . Drug use: No  . Sexual activity: None  Other Topics Concern  . None  Social History Narrative  . None    Review of Systems: A 12 point ROS discussed and pertinent positives are indicated in the HPI above.  All other systems are negative.  Review of Systems  Constitutional: Negative for activity change, appetite change, fatigue and fever.  Respiratory: Negative for shortness of breath.   Cardiovascular: Negative for chest pain.  Gastrointestinal: Negative for abdominal pain.  Neurological: Negative for weakness.  Psychiatric/Behavioral: Negative for behavioral problems and confusion.    Vital Signs: BP (!) 158/82   Pulse 84   Temp 98 F (36.7 C) (Oral)   Resp 16   Ht 6' (1.829 m)   Wt 181 lb (82.1 kg)   SpO2 94%   BMI 24.55 kg/m   Physical Exam  Constitutional: He is oriented to person, place, and time.  Cardiovascular: Normal rate, regular rhythm and normal heart sounds.  Pulmonary/Chest: Effort normal and breath sounds normal.  Abdominal: Soft. Bowel sounds are normal. There is no tenderness.  Musculoskeletal: Normal range of motion.  Neurological: He is alert and oriented to person, place, and time.  Skin: Skin is warm and dry.   Psychiatric: He has a normal mood and affect. His behavior is normal. Judgment and thought content normal.  Nursing note and vitals reviewed.   Imaging: Mr Liver W Wo Contrast  Result Date: 07/25/2017 CLINICAL DATA:  Lung cancer. LEFT lung cancer. Metabolic lesion in the LEFT lobe of the liver the on PET-CT. EXAM: MRI ABDOMEN WITHOUT AND WITH CONTRAST TECHNIQUE: Multiplanar multisequence MR imaging of the abdomen was performed both before and after the administration of intravenous contrast. CONTRAST:  33mL MULTIHANCE GADOBENATE DIMEGLUMINE 529 MG/ML IV SOLN COMPARISON:  PET-CT 07/20/2017 FINDINGS: Lower chest:  Lung bases are clear. Hepatobiliary: Within the lateral segment of the in LEFT hepatic lobe there is a small lesion which is hyperintense on T2 weighted imaging (image 18, series 17) and hypointense on T1 weighted imaging (image 30, series 9. This lesion demonstrates early peripheral enhancement measuring approximately 10 mm (image 32, series 10). The lesion demonstrates persistent rim enhancement without central filling which is not typical of a benign lesion and consistent with a  metastatic lesion. No additional lesions are identified within the liver parenchyma. Gallbladder normal.  Common bile duct normal. Pancreas: Normal pancreatic parenchymal intensity. No ductal dilatation or inflammation. Spleen: Normal spleen. Adrenals/urinary tract: Adrenal glands and kidneys are normal. Stomach/Bowel: Stomach and limited of the small bowel is unremarkable. Gastric diverticulum measuring 2.5 cm extends from the posterior aspect gastric cardia. (Image 10, series 17) Vascular/Lymphatic: Abdominal aortic normal caliber. No retroperitoneal periportal lymphadenopathy. Musculoskeletal: No aggressive osseous lesion IMPRESSION: 1. Enhancing lesion in the LEFT lateral hepatic lobe, hypermetabolic on comparison PET-CT scan, is consistent with METASTATIC LESION. 2. No additional hepatic metastasis. 3. Small gastric  diverticulum. Electronically Signed   By: Suzy Bouchard M.D.   On: 07/25/2017 15:33   Nm Pet Image Restag (ps) Skull Base To Thigh  Result Date: 07/20/2017 CLINICAL DATA:  Subsequent treatment strategy for non-small cell lung cancer. EXAM: NUCLEAR MEDICINE PET SKULL BASE TO THIGH TECHNIQUE: 8.96 mCi F-18 FDG was injected intravenously. Full-ring PET imaging was performed from the skull base to thigh after the radiotracer. CT data was obtained and used for attenuation correction and anatomic localization. FASTING BLOOD GLUCOSE:  Value: 102 mg/dl COMPARISON:  None. FINDINGS: NECK: No hypermetabolic lymph nodes in the neck. CHEST: Previously noted mass in the medial aspect of the superior segment of the left lower lobe (axial image 72 of series 4) is slightly smaller than the prior examination measuring 4.7 x 2.0 cm on today's study, but remains hypermetabolic (SUVmax = 54.0). No definite enlarged or hypermetabolic mediastinal or hilar lymph nodes are noted on today's examination. There is mild diffuse hypermetabolism throughout the esophagus (SUVmax = 4.5), suggestive of esophagitis. Heart size is normal. There is no significant pericardial fluid, thickening or pericardial calcification. There is aortic atherosclerosis, as well as atherosclerosis of the great vessels of the mediastinum and the coronary arteries, including calcified atherosclerotic plaque in the left main, left anterior descending, left circumflex and right coronary arteries. Calcifications of the aortic valve and mitral annulus. Right internal jugular single-lumen porta cath with tip terminating in the superior cavoatrial junction. Diffuse bronchial wall thickening with moderate centrilobular and paraseptal emphysema. ABDOMEN/PELVIS: New 1.8 x 1.3 cm low-attenuation lesion in segment 3 of the liver (axial image 116 of series 4) is hypermetabolic (SUVmax = 08.6), concerning for a new hepatic metastasis. No abnormal hypermetabolic activity within  the liver, pancreas, adrenal glands, or spleen. No hypermetabolic lymph nodes in the abdomen or pelvis. Small gastric diverticulum incidentally noted in the region of the cardia of the stomach. Aortic atherosclerosis, with fusiform ectasia of the infrarenal abdominal aorta which measures up to 2.3 x 2.8 cm. Numerous colonic diverticulae are noted, without surrounding inflammatory changes to suggest an acute diverticulitis at this time. 4 mm calcification lying dependently in the urinary bladder adjacent to the right ureterovesicular junction, likely to represent a nonobstructive calculus. SKELETON: There are multiple new areas of hypermetabolism in the skeleton, including the sternum, xiphoid process and several lower anterior ribs at or near the costochondral junctions, all of which appear to have subtle nondisplaced healing fractures associated with them. IMPRESSION: 1. Although today's study demonstrates some positive response to therapy in terms of decreased size of the primary lesion in the medial aspect of the superior segment of the left lower lobe, there is a new hypermetabolic lesion in segment 3 of the liver, highly concerning for a metastatic lesion. 2. New areas of hypermetabolism are also noted in the sternum, xiphoid process and several lower anterior ribs at or adjacent  to the costochondral junctions. These appear to be associated with nondisplaced rib fractures. Findings suggest either recent trauma or recent CPR. No definite osseous lesions are identified. 3. Mild diffuse hypermetabolism throughout the esophagus, suggesting esophagitis. 4. Aortic atherosclerosis, in addition to left main and 3 vessel coronary artery disease. 5. There are calcifications of the aortic valve and mitral annulus. Echocardiographic correlation for evaluation of potential valvular dysfunction may be warranted if clinically indicated. 6. Moderate centrilobular and paraseptal emphysema. 7. Fusiform ectasia of the infrarenal  abdominal aorta which measures up to 2.8 x 2.3 cm, similar to prior studies. 8. Colonic diverticulosis. 9. Nonobstructive calculus measuring 4 mm in the right side of the urinary bladder. 10. Additional incidental findings, as above. Aortic Atherosclerosis (ICD10-I70.0) and Emphysema (ICD10-J43.9). Electronically Signed   By: Vinnie Langton M.D.   On: 07/20/2017 10:54   US Abdomen Limited  Result Date: 08/04/2017 CLINICAL DATA:  History of lung cancer, now with hypermetabolic liver lesion worrisome for metastatic disease. Please perform ultrasound-guided liver lesion biopsy for tissue diagnostic purposes. EXAM: ULTRASOUND ABDOMEN LIMITED COMPARISON:  PET-CT - 07/20/2017; abdominal MRI - 07/25/2017 FINDINGS: Despite exhaustive search by the dictating interventional radiologist, a a definitive reproducible lesion was not seen to correlate with the worrisome hypermetabolic lesion within the subcapsular aspect the left lobe of the liver. As such, ultrasound-guided liver lesion biopsy was not performed. IMPRESSION: No definitive sonographic correlate for worrisome lesion within subcapsular aspect the left lobe of the liver. As such, ultrasound-guided liver lesion biopsy was not performed. Electronically Signed   By: Sandi Mariscal M.D.   On: 08/04/2017 17:50    Labs:  CBC: Recent Labs    06/01/17 1025 07/23/17 0803 08/04/17 1111 08/18/17 0630  WBC 9.0 7.0 8.1 7.4  HGB 8.2* 11.2* 10.7* 10.4*  HCT 25.2* 33.5* 32.0* 32.5*  PLT 411* 216 238 227    COAGS: Recent Labs    03/09/17 0928 04/06/17 1233 08/04/17 1111 08/18/17 0630  INR 1.03 0.98 0.97 1.11  APTT  --   --   --  31    BMP: Recent Labs    03/10/17 0545 03/11/17 0607  04/06/17 1233  05/04/17 1356 06/01/17 1025 07/23/17 0803 08/04/17 1111  NA 133* 136   < > 132*   < > 130 135 137 134*  K 5.0 5.2*   < > 4.5   < > 4.5 4.3 4.0 4.4  CL 99* 102   < > 96*   < > 93* 99 99 98*  CO2 26 28   < > 26   < > 30 29 28 27   GLUCOSE 119* 118*    < > 99   < > 117 102 106 104*  BUN 23* 17   < > 23*   < > 17 12 18 20   CALCIUM 8.6* 8.7*   < > 9.0   < > 8.4 8.8 9.2 9.1  CREATININE 1.37* 1.14   < > 1.20   < > 1.5* 1.3* 0.9 0.99  GFRNONAA 47* 58*  --  55*  --   --   --   --  >60  GFRAA 54* >60  --  >60  --   --   --   --  >60   < > = values in this interval not displayed.    LIVER FUNCTION TESTS: Recent Labs    05/04/17 1356 06/01/17 1025 07/23/17 0803 08/04/17 1111  BILITOT 0.50 0.60 0.50 0.6  AST 28 29 25  20  ALT 24 34 24 16*  ALKPHOS 80 86* 101* 98  PROT 7.6 7.5 8.0 7.6  ALBUMIN 2.5* 2.7* 3.0* 3.1*    TUMOR MARKERS: No results for input(s): AFPTM, CEA, CA199, CHROMGRNA in the last 8760 hours.  Assessment and Plan:  Hx Lung Ca Follow up PET + liver lesion Now scheduled for CT guided bx Risks and benefits discussed with the patient including, but not limited to bleeding, infection, damage to adjacent structures or low yield requiring additional tests. All of the patient's questions were answered, patient is agreeable to proceed. Consent signed and in chart.  Thank you for this interesting consult.  I greatly enjoyed meeting Vaishnav Demartin and look forward to participating in their care.  A copy of this report was sent to the requesting provider on this date.  Electronically Signed: Lavonia Drafts, PA-C 08/18/2017, 7:06 AM   I spent a total of  30 Minutes   in face to face in clinical consultation, greater than 50% of which was counseling/coordinating care for liver lesion biopsy

## 2017-08-18 NOTE — Sedation Documentation (Signed)
Patient is resting comfortably. 

## 2017-08-18 NOTE — Sedation Documentation (Addendum)
Patient is resting comfortably. 

## 2017-08-18 NOTE — Discharge Instructions (Addendum)
Liver Biopsy, Care After °Refer to this sheet in the next few weeks. These instructions provide you with information on caring for yourself after your procedure. Your health care provider may also give you more specific instructions. Your treatment has been planned according to current medical practices, but problems sometimes occur. Call your health care provider if you have any problems or questions after your procedure. °What can I expect after the procedure? °After your procedure, it is typical to have the following: °· A small amount of discomfort in the area where the biopsy was done and in the right shoulder or shoulder blade. °· A small amount of bruising around the area where the biopsy was done and on the skin over the liver. °· Sleepiness and fatigue for the rest of the day. ° °Follow these instructions at home: °· Rest at home for 1-2 days or as directed by your health care provider. °· Have a friend or family member stay with you for at least 24 hours. °· Because of the medicines used during the procedure, you should not do the following things in the first 24 hours: °? Drive. °? Use machinery. °? Be responsible for the care of other people. °? Sign legal documents. °? Take a bath or shower. °· There are many different ways to close and cover an incision, including stitches, skin glue, and adhesive strips. Follow your health care provider's instructions on: °? Incision care. °? Bandage (dressing) changes and removal. °? Incision closure removal. °· Do not drink alcohol in the first week. °· Do not lift more than 5 pounds or play contact sports for 2 weeks after this test. °· Take medicines only as directed by your health care provider. Do not take medicine containing aspirin or non-steroidal anti-inflammatory medicines such as ibuprofen for 1 week after this test. °· It is your responsibility to get your test results. °Contact a health care provider if: °· You have increased bleeding from an incision  that results in more than a small spot of blood. °· You have redness, swelling, or increasing pain in any incisions. °· You notice a discharge or a bad smell coming from any of your incisions. °· You have a fever or chills. °Get help right away if: °· You develop swelling, bloating, or pain in your abdomen. °· You become dizzy or faint. °· You develop a rash. °· You are nauseous or vomit. °· You have difficulty breathing, feel short of breath, or feel faint. °· You develop chest pain. °· You have problems with your speech or vision. °· You have trouble balancing or moving your arms or legs. °This information is not intended to replace advice given to you by your health care provider. Make sure you discuss any questions you have with your health care provider. °Document Released: 04/18/2005 Document Revised: 03/06/2016 Document Reviewed: 11/25/2013 °Elsevier Interactive Patient Education © 2018 [employer] °Moderate Conscious Sedation, Adult, Care After °These instructions provide you with information about caring for yourself after your procedure. Your health care provider may also give you more specific instructions. Your treatment has been planned according to current medical practices, but problems sometimes occur. Call your health care provider if you have any problems or questions after your procedure. °What can I expect after the procedure? °After your procedure, it is common: °· To feel sleepy for several hours. °· To feel clumsy and have poor balance for several hours. °· To have poor judgment for several hours. °· To vomit if you eat   too soon. ° °Follow these instructions at home: °For at least 24 hours after the procedure: ° °· Do not: °? Participate in activities where you could fall or become injured. °? Drive. °? Use heavy machinery. °? Drink alcohol. °? Take sleeping pills or medicines that cause drowsiness. °? Make important decisions or sign legal documents. °? Take care of children on your  own. °· Rest. °Eating and drinking °· Follow the diet recommended by your health care provider. °· If you vomit: °? Drink water, juice, or soup when you can drink without vomiting. °? Make sure you have little or no nausea before eating solid foods. °General instructions °· Have a responsible adult stay with you until you are awake and alert. °· Take over-the-counter and prescription medicines only as told by your health care provider. °· If you smoke, do not smoke without supervision. °· Keep all follow-up visits as told by your health care provider. This is important. °Contact a health care provider if: °· You keep feeling nauseous or you keep vomiting. °· You feel light-headed. °· You develop a rash. °· You have a fever. °Get help right away if: °· You have trouble breathing. °This information is not intended to replace advice given to you by your health care provider. Make sure you discuss any questions you have with your health care provider. °Document Released: 07/20/2013 Document Revised: 03/03/2016 Document Reviewed: 01/19/2016 °Elsevier Interactive Patient Education © 2018 [employer] ° °

## 2017-08-18 NOTE — Procedures (Signed)
  Procedure: CT liver lesion core biopsy 18g x3 to surg path2 Preprocedure diagnosis: Liver met Postprocedure diagnosis: same EBL:   minimal Complications:  none immediate  See full dictation in [facility].  Dillard Cannon MD Main # [phone] Pager  [phone]

## 2017-08-18 NOTE — Sedation Documentation (Signed)
Patient denies pain and is resting comfortably.  

## 2017-08-20 ENCOUNTER — Other Ambulatory Visit: Payer: Self-pay | Admitting: *Deleted

## 2017-08-20 DIAGNOSIS — C3402 Malignant neoplasm of left main bronchus: Secondary | ICD-10-CM

## 2017-08-21 ENCOUNTER — Other Ambulatory Visit: Payer: Self-pay

## 2017-08-21 ENCOUNTER — Other Ambulatory Visit (HOSPITAL_BASED_OUTPATIENT_CLINIC_OR_DEPARTMENT_OTHER): Payer: Medicare Other

## 2017-08-21 ENCOUNTER — Ambulatory Visit: Payer: Medicare Other

## 2017-08-21 ENCOUNTER — Ambulatory Visit (HOSPITAL_BASED_OUTPATIENT_CLINIC_OR_DEPARTMENT_OTHER): Payer: Medicare Other | Admitting: Hematology & Oncology

## 2017-08-21 ENCOUNTER — Encounter: Payer: Self-pay | Admitting: Hematology & Oncology

## 2017-08-21 VITALS — BP 159/76 | HR 82 | Temp 97.9°F | Resp 17 | Wt 176.0 lb

## 2017-08-21 DIAGNOSIS — C787 Secondary malignant neoplasm of liver and intrahepatic bile duct: Secondary | ICD-10-CM

## 2017-08-21 DIAGNOSIS — D509 Iron deficiency anemia, unspecified: Secondary | ICD-10-CM

## 2017-08-21 DIAGNOSIS — C3492 Malignant neoplasm of unspecified part of left bronchus or lung: Secondary | ICD-10-CM

## 2017-08-21 DIAGNOSIS — Z923 Personal history of irradiation: Secondary | ICD-10-CM | POA: Diagnosis not present

## 2017-08-21 DIAGNOSIS — C3402 Malignant neoplasm of left main bronchus: Secondary | ICD-10-CM

## 2017-08-21 DIAGNOSIS — Z9221 Personal history of antineoplastic chemotherapy: Secondary | ICD-10-CM | POA: Diagnosis not present

## 2017-08-21 LAB — CBC WITH DIFFERENTIAL (CANCER CENTER ONLY)
BASO#: 0.1 10*3/uL (ref 0.0–0.2)
BASO%: 0.7 % (ref 0.0–2.0)
EOS%: 4.9 % (ref 0.0–7.0)
Eosinophils Absolute: 0.4 10*3/uL (ref 0.0–0.5)
HEMATOCRIT: 32.8 % — AB (ref 38.7–49.9)
HEMOGLOBIN: 10.9 g/dL — AB (ref 13.0–17.1)
LYMPH#: 1.9 10*3/uL (ref 0.9–3.3)
LYMPH%: 24.9 % (ref 14.0–48.0)
MCH: 30.2 pg (ref 28.0–33.4)
MCHC: 33.2 g/dL (ref 32.0–35.9)
MCV: 91 fL (ref 82–98)
MONO#: 0.9 10*3/uL (ref 0.1–0.9)
MONO%: 12.3 % (ref 0.0–13.0)
NEUT%: 57.2 % (ref 40.0–80.0)
NEUTROS ABS: 4.4 10*3/uL (ref 1.5–6.5)
Platelets: 234 10*3/uL (ref 145–400)
RBC: 3.61 10*6/uL — ABNORMAL LOW (ref 4.20–5.70)
RDW: 15.1 % (ref 11.1–15.7)
WBC: 7.6 10*3/uL (ref 4.0–10.0)

## 2017-08-21 LAB — CMP (CANCER CENTER ONLY)
ALBUMIN: 2.9 g/dL — AB (ref 3.3–5.5)
ALT(SGPT): 20 U/L (ref 10–47)
AST: 26 U/L (ref 11–38)
Alkaline Phosphatase: 94 U/L — ABNORMAL HIGH (ref 26–84)
BUN: 17 mg/dL (ref 7–22)
CHLORIDE: 96 meq/L — AB (ref 98–108)
CO2: 29 meq/L (ref 18–33)
CREATININE: 1.1 mg/dL (ref 0.6–1.2)
Calcium: 9.2 mg/dL (ref 8.0–10.3)
Glucose, Bld: 104 mg/dL (ref 73–118)
POTASSIUM: 4.3 meq/L (ref 3.3–4.7)
SODIUM: 136 meq/L (ref 128–145)
TOTAL PROTEIN: 7.6 g/dL (ref 6.4–8.1)
Total Bilirubin: 0.5 mg/dl (ref 0.20–1.60)

## 2017-08-21 LAB — FERRITIN: FERRITIN: 632 ng/mL — AB (ref 22–316)

## 2017-08-21 LAB — IRON AND TIBC
%SAT: 13 % — AB (ref 20–55)
IRON: 27 ug/dL — AB (ref 42–163)
TIBC: 205 ug/dL (ref 202–409)
UIBC: 179 ug/dL (ref 117–376)

## 2017-08-21 MED ORDER — METHYLPREDNISOLONE 4 MG PO TBPK: ORAL_TABLET | ORAL | 0 refills | Status: DC

## 2017-08-21 MED ORDER — SODIUM CHLORIDE 0.9% FLUSH
10.0000 mL | INTRAVENOUS | Status: DC | PRN
  Administered 2017-08-21: 10 mL via INTRAVENOUS
  Filled 2017-08-21: qty 10

## 2017-08-21 MED ORDER — HEPARIN SOD (PORK) LOCK FLUSH 100 UNIT/ML IV SOLN
500.0000 [IU] | Freq: Once | INTRAVENOUS | Status: AC | PRN
  Administered 2017-08-21: 500 [IU] via INTRAVENOUS
  Filled 2017-08-21: qty 5
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
## 2017-08-24 ENCOUNTER — Telehealth: Payer: Self-pay | Admitting: *Deleted

## 2017-08-24 NOTE — Telephone Encounter (Addendum)
Patient is aware of results. Appointment made  ----- Message from Volanda Napoleon, MD sent at 08/21/2017  2:51 PM EST ----- Call - iron is low!!  Need 1 dose of Feraheme!  Please set up for next week!!  Laurey Arrow

## 2017-08-26 ENCOUNTER — Other Ambulatory Visit: Payer: Self-pay | Admitting: Family

## 2017-08-26 ENCOUNTER — Ambulatory Visit (HOSPITAL_BASED_OUTPATIENT_CLINIC_OR_DEPARTMENT_OTHER): Payer: Medicare Other

## 2017-08-26 ENCOUNTER — Other Ambulatory Visit: Payer: Self-pay

## 2017-08-26 VITALS — BP 159/65 | HR 75 | Temp 97.8°F | Resp 20

## 2017-08-26 DIAGNOSIS — D509 Iron deficiency anemia, unspecified: Secondary | ICD-10-CM | POA: Insufficient documentation

## 2017-08-26 DIAGNOSIS — C3402 Malignant neoplasm of left main bronchus: Secondary | ICD-10-CM

## 2017-08-26 MED ORDER — SODIUM CHLORIDE 0.9 % IV SOLN
Freq: Once | INTRAVENOUS | Status: AC
  Administered 2017-08-26: 11:00:00 via INTRAVENOUS

## 2017-08-26 MED ORDER — SODIUM CHLORIDE 0.9 % IV SOLN
510.0000 mg | Freq: Once | INTRAVENOUS | Status: AC
  Administered 2017-08-26: 510 mg via INTRAVENOUS
  Filled 2017-08-26: qty 17

## 2017-08-26 MED ORDER — HEPARIN SOD (PORK) LOCK FLUSH 100 UNIT/ML IV SOLN
500.0000 [IU] | Freq: Once | INTRAVENOUS | Status: AC | PRN
  Administered 2017-08-26: 500 [IU] via INTRAVENOUS
  Filled 2017-08-26: qty 5

## 2017-08-26 MED ORDER — SODIUM CHLORIDE 0.9% FLUSH
10.0000 mL | INTRAVENOUS | Status: DC | PRN
  Administered 2017-08-26: 10 mL via INTRAVENOUS
  Filled 2017-08-26: qty 10

## 2017-08-26 NOTE — Patient Instructions (Signed)

## 2017-09-08 ENCOUNTER — Encounter: Payer: Self-pay | Admitting: Hematology & Oncology

## 2017-09-08 ENCOUNTER — Encounter (HOSPITAL_COMMUNITY): Payer: Self-pay

## 2017-09-11 ENCOUNTER — Other Ambulatory Visit (HOSPITAL_BASED_OUTPATIENT_CLINIC_OR_DEPARTMENT_OTHER): Payer: Medicare Other

## 2017-09-11 ENCOUNTER — Ambulatory Visit (HOSPITAL_BASED_OUTPATIENT_CLINIC_OR_DEPARTMENT_OTHER): Payer: Medicare Other | Admitting: Hematology & Oncology

## 2017-09-11 ENCOUNTER — Other Ambulatory Visit: Payer: Self-pay

## 2017-09-11 ENCOUNTER — Ambulatory Visit: Payer: Medicare Other

## 2017-09-11 VITALS — BP 121/63 | HR 83 | Temp 98.3°F | Resp 20 | Wt 173.5 lb

## 2017-09-11 DIAGNOSIS — Z9221 Personal history of antineoplastic chemotherapy: Secondary | ICD-10-CM

## 2017-09-11 DIAGNOSIS — D509 Iron deficiency anemia, unspecified: Secondary | ICD-10-CM

## 2017-09-11 DIAGNOSIS — C3402 Malignant neoplasm of left main bronchus: Secondary | ICD-10-CM

## 2017-09-11 DIAGNOSIS — Z923 Personal history of irradiation: Secondary | ICD-10-CM

## 2017-09-11 DIAGNOSIS — C3492 Malignant neoplasm of unspecified part of left bronchus or lung: Secondary | ICD-10-CM

## 2017-09-11 DIAGNOSIS — C787 Secondary malignant neoplasm of liver and intrahepatic bile duct: Secondary | ICD-10-CM | POA: Diagnosis not present

## 2017-09-11 LAB — CBC WITH DIFFERENTIAL (CANCER CENTER ONLY)
BASO#: 0 10*3/uL (ref 0.0–0.2)
BASO%: 0.4 % (ref 0.0–2.0)
EOS ABS: 0.3 10*3/uL (ref 0.0–0.5)
EOS%: 3.6 % (ref 0.0–7.0)
HEMATOCRIT: 35.7 % — AB (ref 38.7–49.9)
HEMOGLOBIN: 11.8 g/dL — AB (ref 13.0–17.1)
LYMPH#: 1.6 10*3/uL (ref 0.9–3.3)
LYMPH%: 20.4 % (ref 14.0–48.0)
MCH: 30.3 pg (ref 28.0–33.4)
MCHC: 33.1 g/dL (ref 32.0–35.9)
MCV: 92 fL (ref 82–98)
MONO#: 0.9 10*3/uL (ref 0.1–0.9)
MONO%: 11.3 % (ref 0.0–13.0)
NEUT%: 64.3 % (ref 40.0–80.0)
NEUTROS ABS: 4.9 10*3/uL (ref 1.5–6.5)
Platelets: 207 10*3/uL (ref 145–400)
RBC: 3.9 10*6/uL — ABNORMAL LOW (ref 4.20–5.70)
RDW: 14.4 % (ref 11.1–15.7)
WBC: 7.6 10*3/uL (ref 4.0–10.0)

## 2017-09-11 LAB — CMP (CANCER CENTER ONLY)
ALBUMIN: 2.8 g/dL — AB (ref 3.3–5.5)
ALT(SGPT): 22 U/L (ref 10–47)
AST: 23 U/L (ref 11–38)
Alkaline Phosphatase: 103 U/L — ABNORMAL HIGH (ref 26–84)
BILIRUBIN TOTAL: 0.5 mg/dL (ref 0.20–1.60)
BUN, Bld: 18 mg/dL (ref 7–22)
CALCIUM: 8.8 mg/dL (ref 8.0–10.3)
CHLORIDE: 96 meq/L — AB (ref 98–108)
CO2: 30 meq/L (ref 18–33)
Creat: 0.8 mg/dl (ref 0.6–1.2)
GLUCOSE: 101 mg/dL (ref 73–118)
Potassium: 4.4 mEq/L (ref 3.3–4.7)
SODIUM: 137 meq/L (ref 128–145)
Total Protein: 7.7 g/dL (ref 6.4–8.1)

## 2017-09-11 LAB — IRON AND TIBC
%SAT: 16 % — ABNORMAL LOW (ref 20–55)
Iron: 31 ug/dL — ABNORMAL LOW (ref 42–163)
TIBC: 194 ug/dL — ABNORMAL LOW (ref 202–409)
UIBC: 163 ug/dL (ref 117–376)

## 2017-09-11 LAB — FERRITIN

## 2017-09-11 MED ORDER — HEPARIN SOD (PORK) LOCK FLUSH 100 UNIT/ML IV SOLN
500.0000 [IU] | Freq: Once | INTRAVENOUS | Status: AC | PRN
  Administered 2017-09-11: 500 [IU] via INTRAVENOUS
  Filled 2017-09-11: qty 5

## 2017-09-11 MED ORDER — SODIUM CHLORIDE 0.9% FLUSH
10.0000 mL | INTRAVENOUS | Status: DC | PRN
  Administered 2017-09-11: 10 mL via INTRAVENOUS
  Filled 2017-09-11: qty 10

## 2017-09-11 MED ORDER — PREDNISONE 20 MG PO TABS: ORAL_TABLET | ORAL | 0 refills | Status: DC

## 2017-09-11 NOTE — Progress Notes (Signed)
Hematology and Oncology Follow Up Visit  Harry Hinton [id number] Mar 25, 1933 83 y.o. 09/11/2017   Principle Diagnosis:  Metastatic adenocarcinoma of the left lung - PD-L1 (-)  Current Therapy:    Carboplatin/Etoposide - s/p cycle #2        Radiation Therapy to the chest - concurrent - completed                06/01/2017      Interim History:  Harry Hinton is in for his follow-up.  We now have documented metastatic disease.  He did have a CT guided biopsy of a liver lesion that was seen on a PET scan.  This was done on November 6.  The pathology report ([id number]) showed adenocarcinoma.    We have part of his genetic analysis back.  He has a tumor that is PD-L1 negative.  He is looking pretty good.  He does feel tired.  He does have some shortness of breath.  Since he just has the one small liver metastases, I think that we should consider some directed therapy towards this.  I would consider SRS.  I will have to talk with radiation oncology about this.  He has had no problems with abdominal pain.  There is no nausea or vomiting.  He has had no back discomfort.  He has had no obvious bleeding.  He has had no change in bowel or bladder habits.  I would say performance status is ECOG 1.   Medications:  Current Outpatient Medications:  .  acetaminophen (TYLENOL) 500 MG tablet, Take 1,000 mg by mouth every 6 (six) hours as needed for moderate pain or headache., Disp: , Rfl:  .  aspirin EC 81 MG tablet, Take 81 mg by mouth at bedtime. , Disp: , Rfl:  .  carvedilol (COREG) 3.125 MG tablet, Take 1 tablet (3.125 mg total) by mouth 2 (two) times daily with a meal., Disp: 60 tablet, Rfl: 0 .  lidocaine-prilocaine (EMLA) cream, Apply to affected area once, Disp: 30 g, Rfl: 3 .  Multiple Vitamins-Minerals (CENTRUM SILVER 50+MEN) TABS, Take 1 tablet by mouth daily., Disp: , Rfl:  .  vitamin E 200 UNIT capsule, Take 200 Units by mouth daily., Disp: , Rfl:  .  LORazepam (ATIVAN) 0.5 MG tablet, Take  1 tablet (0.5 mg total) by mouth every 6 (six) hours as needed (Nausea or vomiting). (Patient not taking: Reported on 07/23/2017), Disp: 30 tablet, Rfl: 0 .  methylPREDNISolone (MEDROL DOSEPAK) 4 MG TBPK tablet, Take medrol dose pack as directed (Patient not taking: Reported on 09/11/2017), Disp: 1 tablet, Rfl: 0 .  ondansetron (ZOFRAN) 8 MG tablet, Take 1 tablet (8 mg total) by mouth 2 (two) times daily as needed for refractory nausea / vomiting. Start on day 3 after carboplatin chemo. (Patient not taking: Reported on 07/23/2017), Disp: 30 tablet, Rfl: 1 .  prochlorperazine (COMPAZINE) 10 MG tablet, Take 1 tablet (10 mg total) by mouth every 6 (six) hours as needed (Nausea or vomiting). (Patient not taking: Reported on 07/23/2017), Disp: 30 tablet, Rfl: 1  Allergies:  No Known Allergies  Past Medical History, Surgical history, Social history, and Family History were reviewed and updated.  Review of Systems:  As stated in the interim history  Physical Exam: 98.1,  71, 154/68,  Wt is 184lb  Wt Readings from Last 3 Encounters:  09/11/17 173 lb 8 oz (78.7 kg)  08/21/17 176 lb (79.8 kg)  08/18/17 181 lb (82.1 kg)     I examined  Harry Hinton.  My findings on the examination are noted below:   Well-developed well-nourished white male in no obvious distress.  Head neck exam shows no ocular or oral lesions.  There are no palpable cervical or supraclavicular lymph nodes.  Lungs are clear bilaterally.  Cardiac exam regular rate and rhythm with no murmurs, rubs or bruits.  Abdomen soft.  He has good bowel sounds.  There is no fluid wave.  There is no palpable liver or spleen tip.  Back exam shows no tenderness over the spine, ribs or hips.  Extremities shows no clubbing, cyanosis or edema.  Neurological exam shows no focal neurological deficits.  Lab Results  Component Value Date   WBC 7.6 09/11/2017   HGB 11.8 (L) 09/11/2017   HCT 35.7 (L) 09/11/2017   MCV 92 09/11/2017   PLT 207 09/11/2017      Chemistry      Component Value Date/Time   NA 137 09/11/2017 1038   K 4.4 09/11/2017 1038   CL 96 (L) 09/11/2017 1038   CO2 30 09/11/2017 1038   BUN 18 09/11/2017 1038   CREATININE 0.8 09/11/2017 1038      Component Value Date/Time   CALCIUM 8.8 09/11/2017 1038   ALKPHOS 103 (H) 09/11/2017 1038   AST 23 09/11/2017 1038   ALT 22 09/11/2017 1038   BILITOT 0.50 09/11/2017 1038         Impression and Plan: Harry Hinton is a 83 year old white male. He had local advanced non-small cell lung cancer of the left lung.  He was treated with radiation chemotherapy.  Unfortunately, he now has metastatic disease that is biopsy-proven.    It will be very interesting to see what his genetic markers show.  May be, there will be a marker that we can identify that we can target with 1 of our oral drugs.  Again, we will see about the possibility of SRS for the liver lesion.  His iron studies still are a little bit low.  His ferritin is on the high side because of it being an acute inflammatory marker.  His iron saturation is only 16%.  I spent about 35 minutes talking with he and his family.  I went over my recommendations.  They agree with these.  I really hope that we can avoid having to use systemic chemotherapy in his situation.  I would like to see him back in about 3 weeks. Volanda Napoleon, MD 11/30/201811:50 AM

## 2017-09-14 ENCOUNTER — Telehealth: Payer: Self-pay | Admitting: *Deleted

## 2017-09-14 NOTE — Telephone Encounter (Addendum)
-----   Message from Volanda Napoleon, MD sent at 09/11/2017  3:12 PM EST ----- Call - the iron level is low!!!  Needs 1 dose of Feraheme!!  Please set up in 1 week!! Appointment made for December 6th  Pete

## 2017-09-17 ENCOUNTER — Ambulatory Visit (HOSPITAL_BASED_OUTPATIENT_CLINIC_OR_DEPARTMENT_OTHER): Payer: Medicare Other

## 2017-09-17 VITALS — BP 116/60 | HR 84 | Temp 97.8°F | Resp 20

## 2017-09-17 DIAGNOSIS — C3402 Malignant neoplasm of left main bronchus: Secondary | ICD-10-CM

## 2017-09-17 DIAGNOSIS — D509 Iron deficiency anemia, unspecified: Secondary | ICD-10-CM

## 2017-09-17 MED ORDER — SODIUM CHLORIDE 0.9% FLUSH
10.0000 mL | INTRAVENOUS | Status: DC | PRN
  Administered 2017-09-17: 10 mL via INTRAVENOUS
  Filled 2017-09-17: qty 10

## 2017-09-17 MED ORDER — SODIUM CHLORIDE 0.9 % IV SOLN
Freq: Once | INTRAVENOUS | Status: AC
  Administered 2017-09-17: 13:00:00 via INTRAVENOUS

## 2017-09-17 MED ORDER — HEPARIN SOD (PORK) LOCK FLUSH 100 UNIT/ML IV SOLN
500.0000 [IU] | Freq: Once | INTRAVENOUS | Status: AC | PRN
  Administered 2017-09-17: 500 [IU] via INTRAVENOUS
  Filled 2017-09-17: qty 5

## 2017-09-17 MED ORDER — SODIUM CHLORIDE 0.9 % IV SOLN
510.0000 mg | Freq: Once | INTRAVENOUS | Status: AC
  Administered 2017-09-17: 510 mg via INTRAVENOUS
  Filled 2017-09-17: qty 17

## 2017-09-17 NOTE — Patient Instructions (Signed)

## 2017-09-17 NOTE — Addendum Note (Signed)
Addended by: Burney Gauze R on: 09/17/2017 01:23 PM   Modules accepted: Orders

## 2017-09-30 ENCOUNTER — Ambulatory Visit
Admission: RE | Admit: 2017-09-30 | Discharge: 2017-09-30 | Disposition: A | Discharge status: Home / Self Care | Payer: Medicare Other | Source: Ambulatory Visit | Attending: Hematology & Oncology | Admitting: Hematology & Oncology

## 2017-09-30 ENCOUNTER — Encounter: Payer: Self-pay | Admitting: *Deleted

## 2017-09-30 DIAGNOSIS — C3402 Malignant neoplasm of left main bronchus: Secondary | ICD-10-CM

## 2017-09-30 HISTORY — PX: IR RADIOLOGIST EVAL & MGMT: IMG5224

## 2017-10-02 ENCOUNTER — Other Ambulatory Visit (HOSPITAL_BASED_OUTPATIENT_CLINIC_OR_DEPARTMENT_OTHER): Payer: Medicare Other

## 2017-10-02 ENCOUNTER — Ambulatory Visit: Payer: Medicare Other

## 2017-10-02 ENCOUNTER — Ambulatory Visit (HOSPITAL_BASED_OUTPATIENT_CLINIC_OR_DEPARTMENT_OTHER): Payer: Medicare Other | Admitting: Hematology & Oncology

## 2017-10-02 ENCOUNTER — Other Ambulatory Visit: Payer: Self-pay

## 2017-10-02 VITALS — BP 101/59 | HR 78 | Temp 98.4°F | Resp 20

## 2017-10-02 DIAGNOSIS — Z9221 Personal history of antineoplastic chemotherapy: Secondary | ICD-10-CM | POA: Diagnosis not present

## 2017-10-02 DIAGNOSIS — C3492 Malignant neoplasm of unspecified part of left bronchus or lung: Secondary | ICD-10-CM | POA: Diagnosis not present

## 2017-10-02 DIAGNOSIS — C787 Secondary malignant neoplasm of liver and intrahepatic bile duct: Secondary | ICD-10-CM | POA: Diagnosis not present

## 2017-10-02 DIAGNOSIS — D500 Iron deficiency anemia secondary to blood loss (chronic): Secondary | ICD-10-CM

## 2017-10-02 DIAGNOSIS — D509 Iron deficiency anemia, unspecified: Secondary | ICD-10-CM

## 2017-10-02 DIAGNOSIS — C3402 Malignant neoplasm of left main bronchus: Secondary | ICD-10-CM

## 2017-10-02 DIAGNOSIS — Z923 Personal history of irradiation: Secondary | ICD-10-CM | POA: Diagnosis not present

## 2017-10-02 DIAGNOSIS — Z95828 Presence of other vascular implants and grafts: Secondary | ICD-10-CM

## 2017-10-02 LAB — CBC WITH DIFFERENTIAL (CANCER CENTER ONLY)
BASO#: 0 10*3/uL (ref 0.0–0.2)
BASO%: 0.2 % (ref 0.0–2.0)
EOS%: 0.9 % (ref 0.0–7.0)
Eosinophils Absolute: 0.1 10*3/uL (ref 0.0–0.5)
HCT: 38.1 % — ABNORMAL LOW (ref 38.7–49.9)
HEMOGLOBIN: 12.8 g/dL — AB (ref 13.0–17.1)
LYMPH#: 0.8 10*3/uL — AB (ref 0.9–3.3)
LYMPH%: 9.3 % — ABNORMAL LOW (ref 14.0–48.0)
MCH: 30.5 pg (ref 28.0–33.4)
MCHC: 33.6 g/dL (ref 32.0–35.9)
MCV: 91 fL (ref 82–98)
MONO#: 0.5 10*3/uL (ref 0.1–0.9)
MONO%: 6 % (ref 0.0–13.0)
NEUT%: 83.6 % — ABNORMAL HIGH (ref 40.0–80.0)
NEUTROS ABS: 7.1 10*3/uL — AB (ref 1.5–6.5)
Platelets: 160 10*3/uL (ref 145–400)
RBC: 4.19 10*6/uL — AB (ref 4.20–5.70)
RDW: 16.1 % — ABNORMAL HIGH (ref 11.1–15.7)
WBC: 8.5 10*3/uL (ref 4.0–10.0)

## 2017-10-02 LAB — IRON AND TIBC
%SAT: 28 % (ref 20–55)
Iron: 49 ug/dL (ref 42–163)
TIBC: 176 ug/dL — ABNORMAL LOW (ref 202–409)
UIBC: 127 ug/dL (ref 117–376)

## 2017-10-02 LAB — CMP (CANCER CENTER ONLY)
ALBUMIN: 2.6 g/dL — AB (ref 3.3–5.5)
ALK PHOS: 92 U/L — AB (ref 26–84)
ALT: 30 U/L (ref 10–47)
AST: 17 U/L (ref 11–38)
BILIRUBIN TOTAL: 0.6 mg/dL (ref 0.20–1.60)
BUN, Bld: 28 mg/dL — ABNORMAL HIGH (ref 7–22)
CO2: 28 meq/L (ref 18–33)
Calcium: 8.8 mg/dL (ref 8.0–10.3)
Chloride: 96 mEq/L — ABNORMAL LOW (ref 98–108)
Creat: 1.1 mg/dl (ref 0.6–1.2)
Glucose, Bld: 139 mg/dL — ABNORMAL HIGH (ref 73–118)
Potassium: 4.4 mEq/L (ref 3.3–4.7)
SODIUM: 136 meq/L (ref 128–145)
TOTAL PROTEIN: 6.4 g/dL (ref 6.4–8.1)

## 2017-10-02 LAB — LACTATE DEHYDROGENASE: LDH: 186 U/L (ref 125–245)

## 2017-10-02 LAB — FERRITIN

## 2017-10-02 MED ORDER — SODIUM CHLORIDE 0.9% FLUSH
10.0000 mL | INTRAVENOUS | Status: DC | PRN
  Administered 2017-10-02: 10 mL via INTRAVENOUS
  Filled 2017-10-02: qty 10

## 2017-10-02 MED ORDER — HEPARIN SOD (PORK) LOCK FLUSH 100 UNIT/ML IV SOLN
500.0000 [IU] | Freq: Once | INTRAVENOUS | Status: AC
Start: 2017-10-02 — End: 2017-10-02
  Administered 2017-10-02: 500 [IU] via INTRAVENOUS
  Filled 2017-10-02: qty 5
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
## 2017-10-03 NOTE — Consult Note (Signed)
Chief Complaint: Patient was seen in consultation today for ablation of solitary liver metastasis at the request of HarryPeter Hinton  Referring Physician(s): HarryPeter Hinton  History of Present Illness: Harry Hinton is a 83 y.o. male with a history of metastatic adenocarcinoma of the left lung. Diagnosis was made by bronchoscopy with biopsy on 03/11/2017. He has been treated with chemotherapy and radiation therapy with PET scan on 07/20/2017 demonstrating some response to treatment at the level of the primary central left lower lobe lung mass. A new liver lesion was detected, however, in the lateral segment of the left lobe measuring approximately 1.3 x 1.8 cm and demonstrating SUV of 11. The presence of an enhancing liver metastasis was confirmed by MRI of the abdomen which did not demonstrate other metastatic lesions in the liver. The lesion was occult by ultrasound and biopsy was performed under CT guidance on 08/18/2017 which was positive for adenocarcinoma. [name] has no abdominal pain. His performance status is excellent and he currently only has some fatigue and shortness of breath.  Past Medical History:  Diagnosis Date  . Cancer ([place])    lung CA  . Counseling regarding goals of care 03/26/2017  . Hearing loss   . Sinus problem     Past Surgical History:  Procedure Laterality Date  . EYE SURGERY     bilateral cataracts in approx. 2016  . IR FLUORO GUIDE PORT INSERTION RIGHT  04/06/2017  . IR RADIOLOGIST EVAL & MGMT  09/30/2017  . IR US GUIDE VASC ACCESS RIGHT  04/06/2017  . TONSILLECTOMY     age 43  . VASECTOMY    . VIDEO BRONCHOSCOPY WITH ENDOBRONCHIAL ULTRASOUND  03/11/2017   Procedure: VIDEO BRONCHOSCOPY WITH ENDOBRONCHIAL ULTRASOUND;  Surgeon: Collene Gobble, MD;  Location: [facility] OR;  Service: Thoracic;;    Allergies: Chlorhexidine  Medications: Prior to Admission medications   Medication Sig Start Date End Date Taking? Authorizing Provider  acetaminophen (TYLENOL)  500 MG tablet Take 1,000 mg by mouth every 6 (six) hours as needed for moderate pain or headache.   Yes [provider]  aspirin EC 81 MG tablet Take 81 mg by mouth at bedtime.    Yes [provider]  carvedilol (COREG) 3.125 MG tablet Take 1 tablet (3.125 mg total) by mouth 2 (two) times daily with a meal. 03/12/17  Yes Mikhail, [place], DO  lidocaine-prilocaine (EMLA) cream Apply to affected area once 04/06/17  Yes Ennever, Rudell Cobb, MD  Multiple Vitamins-Minerals (CENTRUM SILVER 50+MEN) TABS Take 1 tablet by mouth daily.   Yes [provider]  predniSONE (DELTASONE) 20 MG tablet Take 3 pills daily for 5 days, then 2 pills daily x 5 days then 1 pill a day. 09/11/17  Yes Volanda Napoleon, MD  vitamin E 200 UNIT capsule Take 200 Units by mouth daily.   Yes [provider]  LORazepam (ATIVAN) 0.5 MG tablet Take 1 tablet (0.5 mg total) by mouth every 6 (six) hours as needed (Nausea or vomiting). Patient not taking: Reported on 07/23/2017 04/06/17   Volanda Napoleon, MD  ondansetron (ZOFRAN) 8 MG tablet Take 1 tablet (8 mg total) by mouth 2 (two) times daily as needed for refractory nausea / vomiting. Start on day 3 after carboplatin chemo. Patient not taking: Reported on 07/23/2017 04/06/17   Volanda Napoleon, MD  prochlorperazine (COMPAZINE) 10 MG tablet Take 1 tablet (10 mg total) by mouth every 6 (six) hours as needed (Nausea or vomiting). Patient not  taking: Reported on 07/23/2017 04/06/17   Volanda Napoleon, MD     No family history on file.  Social History   Socioeconomic History  . Marital status: Married    Spouse name: Not on file  . Number of children: Not on file  . Years of education: Not on file  . Highest education level: Not on file  Social Needs  . Financial resource strain: Not on file  . Food insecurity - worry: Not on file  . Food insecurity - inability: Not on file  . Transportation needs - medical: Not on file  . Transportation needs -  non-medical: Not on file  Occupational History  . Not on file  Tobacco Use  . Smoking status: Former Smoker    Types: Cigarettes    Last attempt to quit: 03/10/1987    Years since quitting: 30.5  . Smokeless tobacco: Never Used  Substance and Sexual Activity  . Alcohol use: Yes    Comment: occasional beer or occasional wine  . Drug use: No  . Sexual activity: Not on file  Other Topics Concern  . Not on file  Social History Narrative  . Not on file    ECOG Status: 1 - Symptomatic but completely ambulatory  Review of Systems: A 12 point ROS discussed and pertinent positives are indicated in the HPI above.  All other systems are negative.  Review of Systems  Constitutional: Positive for fatigue.  HENT: Negative.   Respiratory: Positive for shortness of breath.   Cardiovascular: Negative.   Gastrointestinal: Negative.   Genitourinary: Negative.   Musculoskeletal: Negative.   Neurological: Negative.     Vital Signs: BP 129/68   Pulse 78   Temp 97.7 F (36.5 C) (Oral)   Resp 14   Ht 6' (1.829 m)   Wt 173 lb (78.5 kg)   SpO2 94%   BMI 23.46 kg/m   Physical Exam  Constitutional: He is oriented to person, place, and time. No distress.  HENT:  Head: Normocephalic and atraumatic.  Neck: Neck supple. No JVD present.  Cardiovascular: Normal rate and regular rhythm. Exam reveals no gallop and no friction rub.  No murmur heard. 2/6 SEM.  Pulmonary/Chest: Effort normal and breath sounds normal. No stridor. No respiratory distress. He has no wheezes. He has no rales.  Abdominal: Soft. Bowel sounds are normal. He exhibits no distension and no mass. There is no tenderness. There is no rebound and no guarding.  Musculoskeletal: He exhibits no edema.  Lymphadenopathy:    He has no cervical adenopathy.  Neurological: He is alert and oriented to person, place, and time.  Skin: Skin is warm and dry. He is not diaphoretic.  Vitals reviewed.    Imaging: Ir Radiologist Eval &  Mgmt  Result Date: 09/30/2017 Please refer to notes tab for details about interventional procedure. (Op Note)   Labs:  CBC: Recent Labs    08/18/17 0630 08/21/17 1048 09/11/17 1038 10/02/17 1022  WBC 7.4 7.6 7.6 8.5  HGB 10.4* 10.9* 11.8* 12.8*  HCT 32.5* 32.8* 35.7* 38.1*  PLT 227 234 207 160    COAGS: Recent Labs    03/09/17 0928 04/06/17 1233 08/04/17 1111 08/18/17 0630  INR 1.03 0.98 0.97 1.11  APTT  --   --   --  31    BMP: Recent Labs    03/10/17 0545 03/11/17 0607  04/06/17 1233  08/04/17 1111 08/21/17 1048 09/11/17 1038 10/02/17 1022  NA 133* 136   < >  132*   < > 134* 136 137 136  K 5.0 5.2*   < > 4.5   < > 4.4 4.3 4.4 4.4  CL 99* 102   < > 96*   < > 98* 96* 96* 96*  CO2 26 28   < > 26   < > 27 29 30 28   GLUCOSE 119* 118*   < > 99   < > 104* 104 101 139*  BUN 23* 17   < > 23*   < > 20 17 18  28*  CALCIUM 8.6* 8.7*   < > 9.0   < > 9.1 9.2 8.8 8.8  CREATININE 1.37* 1.14   < > 1.20   < > 0.99 1.1 0.8 1.1  GFRNONAA 47* 58*  --  55*  --  >60  --   --   --   GFRAA 54* >60  --  >60  --  >60  --   --   --    < > = values in this interval not displayed.    LIVER FUNCTION TESTS: Recent Labs    08/04/17 1111 08/21/17 1048 09/11/17 1038 10/02/17 1022  BILITOT 0.6 0.50 0.50 0.60  AST 20 26 23 17   ALT 16* 20 22 30   ALKPHOS 98 94* 103* 92*  PROT 7.6 7.6 7.7 6.4  ALBUMIN 3.1* 2.9* 2.8* 2.6*    Assessment and Plan:  I met with Mr. Molstad, his wife [name] and and his daughter Chong Sicilian. We reviewed imaging studies which show the presence of a solitary hypermetabolic liver metastasis within the subcapsular aspect of segment III of the left lobe of the liver. Based on the MRI, when including a rim of peripheral enhancement, the lesion may measure as large as 1.2 x 1.9 cm. This is still well within size limits of what can be treated with percutaneous thermal ablation. Given that his lung carcinoma has decreased in size after initial chemotherapy and radiation  therapy, he would have a survival benefit in treating the current liver metastasis with ablation. He is likely to also undergo further chemotherapy and possible immunotherapy for residual lung carcinoma.  I reviewed details of thermal ablation with the patient and his family including risks and outcomes. The procedure is performed under general anesthesia and involves overnight hospital stay. I believe that there is a high likelihood that the lesion should be able to be treated successfully with a single ablation session. Mr. Attwood is not at high-risk to place under general anesthesia. He does not have any significant comorbidities at this time other than his lung carcinoma.  After discussion, Mr. Bommarito would like to proceed with scheduling an ablation procedure to treat the liver metastasis. We will begin the scheduling process. I told him that I would anticipate that the procedure would likely be performed in mid to late January at [facility] given current scheduling.  Thank you for this interesting consult.  I greatly enjoyed meeting Lydell Moga and look forward to participating in their care.  A copy of this report was sent to the requesting provider on this date.  Electronically SignedAletta Edouard T 10/03/2017, 1:57 PM   I spent a total of 40 Minutes in face to face in clinical consultation, greater than 50% of which was counseling/coordinating care for ablation of a liver metastasis.

## 2017-10-07 ENCOUNTER — Telehealth: Payer: Self-pay | Admitting: *Deleted

## 2017-10-07 NOTE — Telephone Encounter (Addendum)
Patient aware of results  ----- Message from Volanda Napoleon, MD sent at 10/02/2017  4:10 PM EST ----- Call iron level is ok.  pete

## 2017-10-19 ENCOUNTER — Other Ambulatory Visit (HOSPITAL_COMMUNITY): Payer: Self-pay | Admitting: Interventional Radiology

## 2017-10-19 DIAGNOSIS — C787 Secondary malignant neoplasm of liver and intrahepatic bile duct: Secondary | ICD-10-CM

## 2017-10-25 IMAGING — CT NM PET TUM IMG RESTAG (PS) SKULL BASE T - THIGH
1 of 8 series · 1 of 25 positions shown · non-contrast
Comparison: None.

CLINICAL DATA: Subsequent treatment strategy for non-small cell
lung cancer.

EXAM:
NUCLEAR MEDICINE PET SKULL BASE TO THIGH
TECHNIQUE: 8.96 mCi F-18 FDG was injected intravenously. Full-ring PET imaging
was performed from the skull base to thigh after the radiotracer. CT
data was obtained and used for attenuation correction and anatomic
localization.
FASTING BLOOD GLUCOSE:  Value: 102 mg/dl

[Series 4: ct sk_thigh 5.0 b31f · axial · 5.0mm · 0.98mm/px · 1 of 220 slices shown]
[im 220/220  brain]
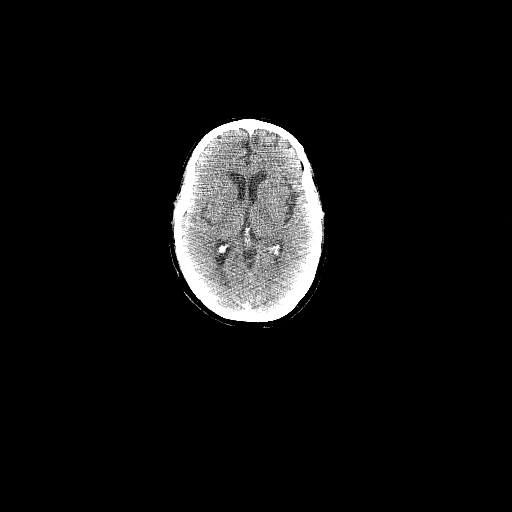

[1 of 25 positions shown; findings below may reference images not displayed]

FINDINGS: NECK: No hypermetabolic lymph nodes in the neck.

CHEST: Previously noted mass in the medial aspect of the superior
segment of the left lower lobe (axial image 72 of series 4) is
slightly smaller than the prior examination measuring 4.7 x 2.0 cm
on today's study, but remains hypermetabolic (SUVmax = 11.7). No
definite enlarged or hypermetabolic mediastinal or hilar lymph nodes
are noted on today's examination. There is mild diffuse
hypermetabolism throughout the esophagus (SUVmax = 4.5), suggestive
of esophagitis. Heart size is normal. There is no significant
pericardial fluid, thickening or pericardial calcification. There is
aortic atherosclerosis, as well as atherosclerosis of the great
vessels of the mediastinum and the coronary arteries, including
calcified atherosclerotic plaque in the left main, left anterior
descending, left circumflex and right coronary arteries.
Calcifications of the aortic valve and mitral annulus. Right
internal jugular single-lumen porta cath with tip terminating in the
superior cavoatrial junction. Diffuse bronchial wall thickening with
moderate centrilobular and paraseptal emphysema.

ABDOMEN/PELVIS: New 1.8 x 1.3 cm low-attenuation lesion in segment 3
of the liver (axial image 116 of series 4) is hypermetabolic (SUVmax
= 11.2), concerning for a new hepatic metastasis. No abnormal
hypermetabolic activity within the liver, pancreas, adrenal glands,
or spleen. No hypermetabolic lymph nodes in the abdomen or pelvis.
Small gastric diverticulum incidentally noted in the region of the
cardia of the stomach. Aortic atherosclerosis, with fusiform ectasia
of the infrarenal abdominal aorta which measures up to 2.3 x 2.8 cm.
Numerous colonic diverticulae are noted, without surrounding
inflammatory changes to suggest an acute diverticulitis at this
time. 4 mm calcification lying dependently in the urinary bladder
adjacent to the right ureterovesicular junction, likely to represent
a nonobstructive calculus.

SKELETON: There are multiple new areas of hypermetabolism in the
skeleton, including the sternum, xiphoid process and several lower
anterior ribs at or near the costochondral junctions, all of which
appear to have subtle nondisplaced healing fractures associated with
them.
IMPRESSION: 1. Although today's study demonstrates some positive response to
therapy in terms of decreased size of the primary lesion in the
medial aspect of the superior segment of the left lower lobe, there
is a new hypermetabolic lesion in segment 3 of the liver, highly
concerning for a metastatic lesion.
2. New areas of hypermetabolism are also noted in the sternum,
xiphoid process and several lower anterior ribs at or adjacent to
the costochondral junctions. These appear to be associated with
nondisplaced rib fractures. Findings suggest either recent trauma or
recent CPR. No definite osseous lesions are identified.
3. Mild diffuse hypermetabolism throughout the esophagus, suggesting
esophagitis.
4. Aortic atherosclerosis, in addition to left main and 3 vessel
coronary artery disease.
5. There are calcifications of the aortic valve and mitral annulus.
Echocardiographic correlation for evaluation of potential valvular
dysfunction may be warranted if clinically indicated.
6. Moderate centrilobular and paraseptal emphysema.
7. Fusiform ectasia of the infrarenal abdominal aorta which measures
up to 2.8 x 2.3 cm, similar to prior studies.
8. Colonic diverticulosis.
9. Nonobstructive calculus measuring 4 mm in the right side of the
urinary bladder.
10. Additional incidental findings, as above.
Aortic Atherosclerosis (3GHL8-EZ7.7) and Emphysema (3GHL8-Q51.X).

## 2017-11-02 ENCOUNTER — Encounter (HOSPITAL_COMMUNITY): Payer: Self-pay

## 2017-11-02 ENCOUNTER — Other Ambulatory Visit: Payer: Self-pay | Admitting: Radiology

## 2017-11-02 NOTE — Pre-Procedure Instructions (Signed)
The following are in epic: Last office note Dr. Kathlene Cote 09/30/17 Last office note Dr. Marin Olp 10/02/17 EKG 03/09/17 ECHO 03/10/17

## 2017-11-02 NOTE — Patient Instructions (Addendum)
    Your procedure is scheduled on: Friday, Jan. 25, 2019   Surgery Time:  12Noon-3:00PM   Report to [facility] Main  Entrance    Report to radiology at 10 AM    Call this number if you have problems the morning of surgery [phone]   Do not eat food or drink liquids :After Midnight.   Do NOT smoke after Midnight   USE DIAL SOAP THE NIGHT BEFORE SURGERY AND THE MORNING OF SURGERY, PUT ON CLEAN PAJAMAS AND SLEEP ON CLEAN SHEETS THE NIGHT BEFORE SURGERY   Take these medicines the morning of surgery with A SIP OF WATER: Carvedilol                               You may not have any metal on your body including jewelry, and body piercings             Do not wear lotions, powders, perfumes/cologne, or deodorant                           Men may shave face and neck.   Do not bring valuables to the hospital. [place].   Contacts, dentures or bridgework may not be worn into surgery.   Leave suitcase in the car. After surgery it may be brought to your room.   Special Instructions: Bring a copy of your healthcare power of attorney and living will documents         the day of surgery if you haven't scanned them in before.              Please read over the following fact sheets you were given:

## 2017-11-04 ENCOUNTER — Encounter (HOSPITAL_COMMUNITY): Payer: Self-pay

## 2017-11-04 ENCOUNTER — Other Ambulatory Visit: Payer: Self-pay

## 2017-11-04 ENCOUNTER — Encounter (HOSPITAL_COMMUNITY)
Admission: RE | Admit: 2017-11-04 | Discharge: 2017-11-04 | Disposition: A | Discharge status: Home / Self Care | Payer: Medicare Other | Source: Ambulatory Visit | Attending: Interventional Radiology | Admitting: Interventional Radiology

## 2017-11-04 ENCOUNTER — Ambulatory Visit (HOSPITAL_COMMUNITY)
Admission: RE | Admit: 2017-11-04 | Discharge: 2017-11-04 | Disposition: A | Discharge status: Home / Self Care | Payer: Medicare Other | Source: Ambulatory Visit | Attending: Radiology | Admitting: Radiology

## 2017-11-04 ENCOUNTER — Other Ambulatory Visit: Payer: Self-pay | Admitting: Radiology

## 2017-11-04 DIAGNOSIS — J984 Other disorders of lung: Secondary | ICD-10-CM | POA: Insufficient documentation

## 2017-11-04 DIAGNOSIS — K769 Liver disease, unspecified: Secondary | ICD-10-CM | POA: Diagnosis not present

## 2017-11-04 DIAGNOSIS — C3490 Malignant neoplasm of unspecified part of unspecified bronchus or lung: Secondary | ICD-10-CM | POA: Diagnosis not present

## 2017-11-04 DIAGNOSIS — R918 Other nonspecific abnormal finding of lung field: Secondary | ICD-10-CM | POA: Diagnosis not present

## 2017-11-04 DIAGNOSIS — Z01818 Encounter for other preprocedural examination: Secondary | ICD-10-CM

## 2017-11-04 HISTORY — DX: Personal history of other medical treatment: Z92.89

## 2017-11-04 HISTORY — DX: Cardiomegaly: I51.7

## 2017-11-04 HISTORY — DX: Essential (primary) hypertension: I10

## 2017-11-04 HISTORY — DX: Gastro-esophageal reflux disease without esophagitis: K21.9

## 2017-11-04 HISTORY — DX: Pneumonia, unspecified organism: J18.9

## 2017-11-04 HISTORY — DX: Anemia, unspecified: D64.9

## 2017-11-04 HISTORY — DX: Paresthesia of skin: R20.2

## 2017-11-04 HISTORY — DX: Nonrheumatic mitral (valve) insufficiency: I34.0

## 2017-11-04 HISTORY — DX: Presence of other vascular implants and grafts: Z95.828

## 2017-11-04 HISTORY — DX: Other nonrheumatic aortic valve disorders: I35.8

## 2017-11-04 HISTORY — DX: Dyspnea, unspecified: R06.00

## 2017-11-04 HISTORY — DX: Acute kidney failure, unspecified: N17.9

## 2017-11-04 LAB — CBC WITH DIFFERENTIAL/PLATELET
BASOS ABS: 0 10*3/uL (ref 0.0–0.1)
Basophils Relative: 0 %
Eosinophils Absolute: 0.2 10*3/uL (ref 0.0–0.7)
Eosinophils Relative: 2 %
HCT: 35.3 % — ABNORMAL LOW (ref 39.0–52.0)
Hemoglobin: 11.5 g/dL — ABNORMAL LOW (ref 13.0–17.0)
LYMPHS ABS: 1.3 10*3/uL (ref 0.7–4.0)
LYMPHS PCT: 13 %
MCH: 29.3 pg (ref 26.0–34.0)
MCHC: 32.6 g/dL (ref 30.0–36.0)
MCV: 90.1 fL (ref 78.0–100.0)
MONO ABS: 0.9 10*3/uL (ref 0.1–1.0)
Monocytes Relative: 9 %
Neutro Abs: 7.8 10*3/uL — ABNORMAL HIGH (ref 1.7–7.7)
Neutrophils Relative %: 76 %
Platelets: 266 10*3/uL (ref 150–400)
RBC: 3.92 MIL/uL — AB (ref 4.22–5.81)
RDW: 16.1 % — ABNORMAL HIGH (ref 11.5–15.5)
WBC: 10.2 10*3/uL (ref 4.0–10.5)

## 2017-11-04 LAB — COMPREHENSIVE METABOLIC PANEL
ALT: 26 U/L (ref 17–63)
AST: 24 U/L (ref 15–41)
Albumin: 2.6 g/dL — ABNORMAL LOW (ref 3.5–5.0)
Alkaline Phosphatase: 99 U/L (ref 38–126)
Anion gap: 7 (ref 5–15)
BUN: 18 mg/dL (ref 6–20)
CALCIUM: 8.5 mg/dL — AB (ref 8.9–10.3)
CO2: 28 mmol/L (ref 22–32)
CREATININE: 1 mg/dL (ref 0.61–1.24)
Chloride: 97 mmol/L — ABNORMAL LOW (ref 101–111)
GFR calc Af Amer: >60 mL/min (ref >60)
Glucose, Bld: 99 mg/dL (ref 65–99)
Potassium: 4.9 mmol/L (ref 3.5–5.1)
Sodium: 132 mmol/L — ABNORMAL LOW (ref 135–145)
Total Bilirubin: 0.3 mg/dL (ref 0.3–1.2)
Total Protein: 6.6 g/dL (ref 6.5–8.1)

## 2017-11-04 LAB — PROTIME-INR
INR: 1.12
PROTHROMBIN TIME: 14.3 s (ref 11.4–15.2)

## 2017-11-04 NOTE — Pre-Procedure Instructions (Signed)
CBC/diff and CMP results 11/04/17 faxed to Dr. Kathlene Cote via epic.

## 2017-11-06 ENCOUNTER — Encounter (HOSPITAL_COMMUNITY): Payer: Self-pay

## 2017-11-06 ENCOUNTER — Other Ambulatory Visit: Payer: Self-pay

## 2017-11-06 ENCOUNTER — Ambulatory Visit (HOSPITAL_COMMUNITY): Payer: Medicare Other | Admitting: Certified Registered Nurse Anesthetist

## 2017-11-06 ENCOUNTER — Ambulatory Visit (HOSPITAL_COMMUNITY)
Admission: RE | Admit: 2017-11-06 | Discharge: 2017-11-06 | Disposition: A | Discharge status: Home / Self Care | Payer: Medicare Other | Source: Ambulatory Visit | Attending: Interventional Radiology | Admitting: Interventional Radiology

## 2017-11-06 ENCOUNTER — Encounter (HOSPITAL_COMMUNITY): Payer: Self-pay | Admitting: *Deleted

## 2017-11-06 ENCOUNTER — Encounter (HOSPITAL_COMMUNITY): Payer: Self-pay | Admitting: Anesthesiology

## 2017-11-06 ENCOUNTER — Encounter (HOSPITAL_COMMUNITY)
Admission: RE | Disposition: A | Discharge status: Home / Self Care | Payer: Self-pay | Source: Ambulatory Visit | Attending: Interventional Radiology

## 2017-11-06 DIAGNOSIS — Z87891 Personal history of nicotine dependence: Secondary | ICD-10-CM | POA: Diagnosis not present

## 2017-11-06 DIAGNOSIS — Z95828 Presence of other vascular implants and grafts: Secondary | ICD-10-CM | POA: Insufficient documentation

## 2017-11-06 DIAGNOSIS — C3492 Malignant neoplasm of unspecified part of left bronchus or lung: Secondary | ICD-10-CM | POA: Diagnosis not present

## 2017-11-06 DIAGNOSIS — R011 Cardiac murmur, unspecified: Secondary | ICD-10-CM | POA: Diagnosis not present

## 2017-11-06 DIAGNOSIS — C787 Secondary malignant neoplasm of liver and intrahepatic bile duct: Secondary | ICD-10-CM | POA: Diagnosis present

## 2017-11-06 DIAGNOSIS — H9190 Unspecified hearing loss, unspecified ear: Secondary | ICD-10-CM | POA: Insufficient documentation

## 2017-11-06 DIAGNOSIS — I358 Other nonrheumatic aortic valve disorders: Secondary | ICD-10-CM | POA: Diagnosis not present

## 2017-11-06 DIAGNOSIS — I10 Essential (primary) hypertension: Secondary | ICD-10-CM | POA: Insufficient documentation

## 2017-11-06 DIAGNOSIS — Z7982 Long term (current) use of aspirin: Secondary | ICD-10-CM | POA: Insufficient documentation

## 2017-11-06 DIAGNOSIS — Z79899 Other long term (current) drug therapy: Secondary | ICD-10-CM | POA: Diagnosis not present

## 2017-11-06 DIAGNOSIS — Z5309 Procedure and treatment not carried out because of other contraindication: Secondary | ICD-10-CM | POA: Insufficient documentation

## 2017-11-06 DIAGNOSIS — J984 Other disorders of lung: Secondary | ICD-10-CM | POA: Diagnosis not present

## 2017-11-06 DIAGNOSIS — K219 Gastro-esophageal reflux disease without esophagitis: Secondary | ICD-10-CM | POA: Insufficient documentation

## 2017-11-06 DIAGNOSIS — J90 Pleural effusion, not elsewhere classified: Secondary | ICD-10-CM | POA: Diagnosis not present

## 2017-11-06 HISTORY — PX: RADIOLOGY WITH ANESTHESIA: SHX6223

## 2017-11-06 LAB — TYPE AND SCREEN
ABO/RH(D): O POS
Antibody Screen: NEGATIVE

## 2017-11-06 SURGERY — CT WITH ANESTHESIA
Anesthesia: General

## 2017-11-06 MED ORDER — PROPOFOL 10 MG/ML IV BOLUS
INTRAVENOUS | Status: DC | PRN
  Administered 2017-11-06: 130 mg via INTRAVENOUS

## 2017-11-06 MED ORDER — FENTANYL CITRATE (PF) 100 MCG/2ML IJ SOLN
INTRAMUSCULAR | Status: DC | PRN
  Administered 2017-11-06: 100 ug via INTRAVENOUS

## 2017-11-06 MED ORDER — PIPERACILLIN-TAZOBACTAM 3.375 G IVPB
3.3750 g | Freq: Once | INTRAVENOUS | Status: AC
  Administered 2017-11-06: 3.375 g via INTRAVENOUS
  Filled 2017-11-06: qty 50

## 2017-11-06 MED ORDER — PHENYLEPHRINE HCL 10 MG/ML IJ SOLN
INTRAVENOUS | Status: DC | PRN
  Administered 2017-11-06: 50 ug/min via INTRAVENOUS

## 2017-11-06 MED ORDER — EPHEDRINE SULFATE-NACL 50-0.9 MG/10ML-% IV SOSY
PREFILLED_SYRINGE | INTRAVENOUS | Status: DC | PRN
  Administered 2017-11-06: 10 mg via INTRAVENOUS
  Administered 2017-11-06: 5 mg via INTRAVENOUS
  Administered 2017-11-06: 10 mg via INTRAVENOUS

## 2017-11-06 MED ORDER — SUGAMMADEX SODIUM 200 MG/2ML IV SOLN
INTRAVENOUS | Status: DC | PRN
  Administered 2017-11-06: 200 mg via INTRAVENOUS

## 2017-11-06 MED ORDER — ONDANSETRON HCL 4 MG/2ML IJ SOLN
INTRAMUSCULAR | Status: DC | PRN
  Administered 2017-11-06: 4 mg via INTRAVENOUS

## 2017-11-06 MED ORDER — LIDOCAINE 2% (20 MG/ML) 5 ML SYRINGE
INTRAMUSCULAR | Status: DC | PRN
  Administered 2017-11-06: 100 mg via INTRAVENOUS

## 2017-11-06 MED ORDER — LACTATED RINGERS IV SOLN
INTRAVENOUS | Status: DC
  Administered 2017-11-06 (×2): via INTRAVENOUS

## 2017-11-06 MED ORDER — IOPAMIDOL (ISOVUE-370) INJECTION 76%
INTRAVENOUS | Status: AC
  Filled 2017-11-06: qty 100

## 2017-11-06 MED ORDER — PHENYLEPHRINE 40 MCG/ML (10ML) SYRINGE FOR IV PUSH (FOR BLOOD PRESSURE SUPPORT)
PREFILLED_SYRINGE | INTRAVENOUS | Status: DC | PRN
  Administered 2017-11-06 (×4): 80 ug via INTRAVENOUS

## 2017-11-06 MED ORDER — ROCURONIUM BROMIDE 50 MG/5ML IV SOSY
PREFILLED_SYRINGE | INTRAVENOUS | Status: DC | PRN
  Administered 2017-11-06: 50 mg via INTRAVENOUS

## 2017-11-06 MED ORDER — FENTANYL CITRATE (PF) 250 MCG/5ML IJ SOLN
INTRAMUSCULAR | Status: AC
  Filled 2017-11-06: qty 5

## 2017-11-06 MED ORDER — DEXAMETHASONE SODIUM PHOSPHATE 4 MG/ML IJ SOLN
INTRAMUSCULAR | Status: DC | PRN
  Administered 2017-11-06: 10 mg via INTRAVENOUS

## 2017-11-06 MED ORDER — IOPAMIDOL (ISOVUE-370) INJECTION 76%
50.0000 mL | Freq: Once | INTRAVENOUS | Status: AC | PRN
  Administered 2017-11-06: 50 mL via INTRAVENOUS

## 2017-11-06 NOTE — Anesthesia Preprocedure Evaluation (Addendum)
Anesthesia Evaluation  Patient identified by MRN, date of birth, ID band Patient awake    Reviewed: Allergy & Precautions, NPO status , Patient's Chart, lab work & pertinent test results, reviewed documented beta blocker date and time   Airway Mallampati: II       Dental no notable dental hx. (+) Teeth Intact   Pulmonary former smoker,    Pulmonary exam normal breath sounds clear to auscultation       Cardiovascular hypertension, Pt. on home beta blockers  Rhythm:Irregular Rate:Normal + Systolic murmurs    Neuro/Psych negative neurological ROS  negative psych ROS   GI/Hepatic   Endo/Other  negative endocrine ROS  Renal/GU      Musculoskeletal negative musculoskeletal ROS (+)   Abdominal Normal abdominal exam  (+)   Peds  Hematology  (+) Blood dyscrasia, anemia ,   Anesthesia Other Findings Harry Hinton  ECHO COMPLETE WO IMAGING ENHANCING AGENT  Order# [id number]  Reading physician: Pixie Casino, MD Ordering physician: Kerney Elbe, DO Study date: 03/10/17 Study Result   Result status: Final result                             *[facility] [employer].                        [place], [place] [address]                            [phone]  ------------------------------------------------------------------- Transthoracic Echocardiography  Patient:    Harry Hinton, Harry Hinton MR #:       [id number] Study Date: 03/10/2017 Gender:     M Age:        84 Height:     185.4 cm Weight:     82.6 kg BSA:        2.07 m^2 Pt. Status: Room:       1410   ADMITTING    Zack Seal, Inpatient  SONOGRAPHER  Darlina Sicilian, RDCS  ATTENDING    [place], [place], Omair Latif  REFERRING    [place], [place] Latif  cc:  ------------------------------------------------------------------- LV EF:  55% -   60%  ------------------------------------------------------------------- Indications:      Chest pain 786.51.  ------------------------------------------------------------------- History:   PMH:  Left Hilar Mass. Acute Kidney Injury.  Risk factors:  Hypertension.  ------------------------------------------------------------------- Study Conclusions  - Left ventricle: The cavity size was normal. Wall thickness was   increased in a pattern of mild LVH. Systolic function was normal.   The estimated ejection fraction was in the range of 55% to 60%.   Wall motion was normal; there were no regional wall motion   abnormalities. Doppler parameters are consistent with abnormal   left ventricular relaxation (grade 1 diastolic dysfunction). The   E/e&' ratio is between 8-15, suggesting indeterminate LV filling   pressure. - Aortic valve: Trileaflet. Sclerosis without stenosis. There was   no regurgitation. - Mitral valve: Calcified annulus. Mildly thickened leaflets .   There was trivial regurgitation. - Left atrium: The atrium  was normal in size. - Inferior vena cava: The vessel was normal in size. The   respirophasic diameter changes were in the normal range (>= 50%),   consistent with normal central venous pressure.  Impressions:  - LVEF 55-60%, mild LVH, normal wall motion, grade 1 DD,   indeterminate LV filling pressure, aortic valve sclerosis,   trivial MR, normal LA size, normal IVC.  ------------------------------------------------------------------- Study data:  No prior study was available for comparison.  Study status:  Routine.  Procedure:  The patient reported no pain pre or post test. Transthoracic echocardiography. Image quality was adequate.          Transthoracic echocardiography.  M-mode, complete 2D, spectral Doppler, and color Doppler.  Birthdate: Patient birthdate: May 17, 1933.  Age:  Patient is 84 yr old.  Sex: Gender: male.    BMI: 24 kg/m^2.  Blood  pressure:     162/88 Patient status:  Inpatient.  Study date:  Study date: 03/10/2017. Study time: 01:53 PM.  Location:  Bedside.  -------------------------------------------------------------------  ------------------------------------------------------------------- Left ventricle:  The cavity size was normal. Wall thickness was increased in a pattern of mild LVH. Systolic function was normal. The estimated ejection fraction was in the range of 55% to 60%. Wall motion was normal; there were no regional wall motion abnormalities. Doppler parameters are consistent with abnormal left ventricular relaxation (grade 1 diastolic dysfunction). The E/e&' ratio is between 8-15, suggesting indeterminate LV filling pressure.  ------------------------------------------------------------------- Aortic valve:   Trileaflet. Sclerosis without stenosis.  Doppler: There was no regurgitation.  ------------------------------------------------------------------- Aorta:  Aortic root: The aortic root was normal in size. Ascending aorta: The ascending aorta was normal in size.  ------------------------------------------------------------------- Mitral valve:   Calcified annulus. Mildly thickened leaflets . Doppler:  There was trivial regurgitation.    Peak gradient (D): 4 mm Hg.  ------------------------------------------------------------------- Left atrium:  The atrium was normal in size.  ------------------------------------------------------------------- Atrial septum:  Poorly visualized.  ------------------------------------------------------------------- Right ventricle:  The cavity size was normal. Wall thickness was normal. Systolic function was normal.  ------------------------------------------------------------------- Pulmonic valve:    The valve appears to be grossly normal. Doppler:  There was no significant  regurgitation.  ------------------------------------------------------------------- Tricuspid valve:   Doppler:  There was no significant regurgitation.  ------------------------------------------------------------------- Pulmonary artery:   The main pulmonary artery was normal-sized.  ------------------------------------------------------------------- Right atrium:  The atrium was normal in size.  ------------------------------------------------------------------- Pericardium:  There was no pericardial effusion.  ------------------------------------------------------------------- Systemic veins: Inferior vena cava: The vessel was normal in size. The respirophasic diameter changes were in the normal range (>= 50%), consistent with normal central venous pressure.  ------------------------------------------------------------------- Measurements   Left ventricle                           Value        Reference  LV ID, ED, PLAX chordal        (L)       40.9  mm     43 - 52  LV ID, ES, PLAX chordal                  30.5  mm     23 - 38  LV fx shortening, PLAX chordal (L)       25    %      >=29  LV PW thickness, ED                      10  mm     ----------  IVS/LV PW ratio, ED            (H)       1.5          <=1.3  LV e&', lateral                           9.79  cm/s   ----------  LV E/e&', lateral                         9.61         ----------  LV e&', medial                            7.18  cm/s   ----------  LV E/e&', medial                          13.11        ----------  LV e&', average                           8.49  cm/s   ----------  LV E/e&', average                         11.09        ----------    Ventricular septum                       Value        Reference  IVS thickness, ED                        15    mm     ----------    LVOT                                     Value        Reference  LVOT peak velocity, S                    96.7  cm/s    ----------  LVOT mean velocity, S                    64.4  cm/s   ----------  LVOT VTI, S                              19.4  cm     ----------    Aorta                                    Value        Reference  Aortic root ID, ED                       33    mm     ----------    Left atrium  Value        Reference  LA ID, A-P, ES                           36    mm     ----------  LA ID/bsa, A-P                           1.74  cm/m^2 <=2.2  LA volume, S                             50.7  ml     ----------  LA volume/bsa, S                         24.5  ml/m^2 ----------  LA volume, ES, 1-p A4C                   46.2  ml     ----------  LA volume/bsa, ES, 1-p A4C               22.4  ml/m^2 ----------  LA volume, ES, 1-p A2C                   53.3  ml     ----------  LA volume/bsa, ES, 1-p A2C               25.8  ml/m^2 ----------    Mitral valve                             Value        Reference  Mitral E-wave peak velocity              94.1  cm/s   ----------  Mitral A-wave peak velocity              117   cm/s   ----------  Mitral deceleration time       (H)       282   ms     150 - 230  Mitral peak gradient, D                  4     mm Hg  ----------  Mitral E/A ratio, peak                   0.8          ----------    Right atrium                             Value        Reference  RA ID, S-I, ES, A4C                      44.1  mm     34 - 49  RA area, ES, A4C                         11.7  cm^2   8.3 - 19.5  RA volume, ES, A/L                       26.3  ml     ----------  RA volume/bsa, ES, A/L                   12.7  ml/m^2 ----------    Right ventricle                          Value        Reference  TAPSE                                    20.9  mm     ----------  RV s&', lateral, S                        14.6  cm/s   ----------  Legend: (L)  and  (H)  mark values outside specified reference  range.  ------------------------------------------------------------------- Prepared and Electronically Authenticated by  Lyman Bishop MD 2018-05-29T16:07:22 [facility] Images   Show images for ECHOCARDIOGRAM COMPLETE Patient Information   Patient Name Harry Hinton, Harry Hinton Sex Male DOB [birth year] SSN [id number] Reason For Exam  Priority: Routine  Not on file Surgical History   Surgical History    No past medical history on file.  Other Surgical History    Procedure Laterality Date Comment Source COLONOSCOPY    Provider EYE SURGERY   bilateral cataracts in approx. 2016 Provider IR FLUORO GUIDE PORT INSERTION RIGHT  04/06/2017  Provider IR RADIOLOGIST EVAL & MGMT  09/30/2017  Provider IR US GUIDE VASC ACCESS RIGHT  04/06/2017  Provider LIVER BIOPSY    Provider TONSILLECTOMY   age 43 Provider VASECTOMY    Provider VIDEO BRONCHOSCOPY WITH ENDOBRONCHIAL ULTRASOUND  03/11/2017 Procedure: VIDEO BRONCHOSCOPY WITH ENDOBRONCHIAL ULTRASOUND; Surgeon: Collene Gobble, MD; Location: [facility]; Service: Thoracic;; Provider  Patient Data   Height  73 in  BP  162/88 mmHg    Performing Technologist/Nurse   Performing Technologist/Nurse: Hassie Bruce          Implants     No active implants to display in this view. Order-Level Documents:   There are no order-level documents.  Encounter-Level Documents - 03/09/2017:   Scan on 03/13/2017 11:01 AM by Default, Provider, MDScan on 03/13/2017 11:01 AM by Default, Provider, MD  Scan on 03/13/2017 10:53 AM by Default, Provider, MDScan on 03/13/2017 10:53 AM by Default, Provider, MD  Document on 03/12/2017 1:28 PM by Fidel Levy, RN: IP After Visit Summary  Scan on 03/12/2017 11:36 AM by Default, Provider, MDScan on 03/12/2017 11:36 AM by Default, Provider, MD  Scan on 03/12/2017 7:34 AM by Default, Provider, MDScan on 03/12/2017 7:34 AM by Default, Provider, MD  Scan on 03/10/2017 8:34 AM by Default, Provider, MDScan on  03/10/2017 8:34 AM by Default, Provider, MD  Scan on 03/12/2017 9:05 AM by Default, Provider, MDScan on 03/12/2017 9:05 AM by Default, Provider, MD  Scan on 03/09/2017 9:34 AM by Default, Provider, MDScan on 03/09/2017 9:34 AM by Default, Provider, MD  Scan on 03/09/2017 8:37 PM by Default, Provider, MDScan on 03/09/2017 8:37 PM by Default, Provider, MD    Signed   Electronically signed by Pixie Casino, MD on 03/10/17 at 1607 EDT Printable Result Report    Result Report  External Result Report    External Result Report     Reproductive/Obstetrics  Anesthesia Physical Anesthesia Plan  ASA: III  Anesthesia Plan: General   Post-op Pain Management:    Induction: Intravenous  PONV Risk Score and Plan: 3 and Ondansetron, Dexamethasone and Treatment may vary due to age or medical condition  Airway Management Planned: Oral ETT  Additional Equipment:   Intra-op Plan:   Post-operative Plan: Extubation in OR  Informed Consent: I have reviewed the patients History and Physical, chart, labs and discussed the procedure including the risks, benefits and alternatives for the proposed anesthesia with the patient or authorized representative who has indicated his/her understanding and acceptance.     Plan Discussed with: CRNA and Surgeon  Anesthesia Plan Comments:         Anesthesia Quick Evaluation

## 2017-11-06 NOTE — Transfer of Care (Signed)
Immediate Anesthesia Transfer of Care Note  Patient: Harry Hinton  Procedure(s) Performed: CT MICROWAVE THERMAL ABLATION - LIVER (N/A )  Patient Location: PACU  Anesthesia Type:General  Level of Consciousness: awake and alert   Airway & Oxygen Therapy: Patient Spontanous Breathing and Patient connected to face mask oxygen  Post-op Assessment: Report given to RN and Post -op Vital signs reviewed and stable  Post vital signs: Reviewed and stable  Last Vitals:  Vitals:   11/06/17 1443  Temp: 36.5 C    Last Pain: There were no vitals filed for this visit.       Complications: No apparent anesthesia complications

## 2017-11-06 NOTE — Discharge Instructions (Signed)
General Anesthesia, Adult, Care After °These instructions provide you with information about caring for yourself after your procedure. Your health care provider may also give you more specific instructions. Your treatment has been planned according to current medical practices, but problems sometimes occur. Call your health care provider if you have any problems or questions after your procedure. °What can I expect after the procedure? °After the procedure, it is common to have: °· Vomiting. °· A sore throat. °· Mental slowness. ° °It is common to feel: °· Nauseous. °· Cold or shivery. °· Sleepy. °· Tired. °· Sore or achy, even in parts of your body where you did not have surgery. ° °Follow these instructions at home: °For at least 24 hours after the procedure: °· Do not: °? Participate in activities where you could fall or become injured. °? Drive. °? Use heavy machinery. °? Drink alcohol. °? Take sleeping pills or medicines that cause drowsiness. °? Make important decisions or sign legal documents. °? Take care of children on your own. °· Rest. °Eating and drinking °· If you vomit, drink water, juice, or soup when you can drink without vomiting. °· Drink enough fluid to keep your urine clear or pale yellow. °· Make sure you have little or no nausea before eating solid foods. °· Follow the diet recommended by your health care provider. °General instructions °· Have a responsible adult stay with you until you are awake and alert. °· Return to your normal activities as told by your health care provider. Ask your health care provider what activities are safe for you. °· Take over-the-counter and prescription medicines only as told by your health care provider. °· If you smoke, do not smoke without supervision. °· Keep all follow-up visits as told by your health care provider. This is important. °Contact a health care provider if: °· You continue to have nausea or vomiting at home, and medicines are not helpful. °· You  cannot drink fluids or start eating again. °· You cannot urinate after 8-12 hours. °· You develop a skin rash. °· You have fever. °· You have increasing redness at the site of your procedure. °Get help right away if: °· You have difficulty breathing. °· You have chest pain. °· You have unexpected bleeding. °· You feel that you are having a life-threatening or urgent problem. °This information is not intended to replace advice given to you by your health care provider. Make sure you discuss any questions you have with your health care provider. °Document Released: 01/05/2001 Document Revised: 03/03/2016 Document Reviewed: 09/13/2015 °Elsevier Interactive Patient Education © 2018 [employer] ° °

## 2017-11-06 NOTE — Discharge Summary (Signed)
Patient ID: Harry Hinton MRN: [id number] DOB/AGE: 03/16/1933 84 y.o.  Admit date: 11/06/2017 Discharge date: 11/06/2017  Supervising Physician: Aletta Edouard  Patient Status: [facility] OP  Admission Diagnoses: Metastatic lung cancer to liver  Discharge Diagnoses: Metastatic lung cancer to liver, status post canceled CT-guided thermal ablation of liver metastasis secondary to new and enlarging pre-existing liver lesions. Active Problems:   * No active hospital problems. *  Past Medical History:  Diagnosis Date  . AKI (acute kidney injury) ([facility]) 03/10/2017   NOTED ON ECHO  . Anemia    history of  . Aortic valve sclerosis 03/09/2017   NOTED ON ECHO  . Bronchitis   . Cancer ([place])     left lung CA  . Counseling regarding goals of care 03/26/2017  . Dyspnea   . GERD (gastroesophageal reflux disease)   . Hearing loss   . History of blood transfusion   . Hypertension   . LVH (left ventricular hypertrophy) 03/10/2017   mild, NOTED ON ECHO  . MR (mitral regurgitation)    trivial, NOTED ON ECHO  . Pneumonia   . Port-A-Cath in place   . Sinus problem   . Tingling    right foot   Past Surgical History:  Procedure Laterality Date  . COLONOSCOPY    . EYE SURGERY     bilateral cataracts in approx. 2016  . IR FLUORO GUIDE PORT INSERTION RIGHT  04/06/2017  . IR RADIOLOGIST EVAL & MGMT  09/30/2017  . IR US GUIDE VASC ACCESS RIGHT  04/06/2017  . LIVER BIOPSY    . TONSILLECTOMY     age 57  . VASECTOMY    . VIDEO BRONCHOSCOPY WITH ENDOBRONCHIAL ULTRASOUND  03/11/2017   Procedure: VIDEO BRONCHOSCOPY WITH ENDOBRONCHIAL ULTRASOUND;  Surgeon: Collene Gobble, MD;  Location: [facility];  Service: Thoracic;;      Discharged Condition: good  Hospital Course: Mr. Dolecki is an 84 year old male with hx metastatic adenocarcinoma of the lung to the liver;s/p consultation with Dr. Kathlene Cote on 09/30/17 to discuss treatment options.  He was deemed an appropriate candidate for CT-guided thermal ablation  of the segment 3 liver metastasis and presented to [facility] on 11/06/17 for the procedure.  Following successful intubation he underwent follow-up CT abdomen which revealed new and enlarging pre-existing liver lesions, a significant change since last imaging in 07/2017.  In view of the new findings patient was not deemed an ideal candidate to proceed with thermal ablation of the liver at this time.  Findings were discussed with Dr. Marin Olp and case was canceled.  He was subsequently extubated without complication.  Post extubation the patient was stable.  He had no new complaints.  Physical exam was unchanged.  Vital signs were stable.  He will be scheduled for follow-up with Dr. Marin Olp next Friday.  He will resume his usual home medications.  Both patient and family were updated with new findings.    Consults: none  Significant Diagnostic Studies:  Results for orders placed or performed during the hospital encounter of 11/04/17  CBC with Differential/Platelet  Result Value Ref Range   WBC 10.2 4.0 - 10.5 K/uL   RBC 3.92 (L) 4.22 - 5.81 MIL/uL   Hemoglobin 11.5 (L) 13.0 - 17.0 g/dL   HCT 35.3 (L) 39.0 - 52.0 %   MCV 90.1 78.0 - 100.0 fL   MCH 29.3 26.0 - 34.0 pg   MCHC 32.6 30.0 - 36.0 g/dL   RDW 16.1 (H) 11.5 - 15.5 %  Platelets 266 150 - 400 K/uL   Neutrophils Relative % 76 %   Neutro Abs 7.8 (H) 1.7 - 7.7 K/uL   Lymphocytes Relative 13 %   Lymphs Abs 1.3 0.7 - 4.0 K/uL   Monocytes Relative 9 %   Monocytes Absolute 0.9 0.1 - 1.0 K/uL   Eosinophils Relative 2 %   Eosinophils Absolute 0.2 0.0 - 0.7 K/uL   Basophils Relative 0 %   Basophils Absolute 0.0 0.0 - 0.1 K/uL  Comprehensive metabolic panel  Result Value Ref Range   Sodium 132 (L) 135 - 145 mmol/L   Potassium 4.9 3.5 - 5.1 mmol/L   Chloride 97 (L) 101 - 111 mmol/L   CO2 28 22 - 32 mmol/L   Glucose, Bld 99 65 - 99 mg/dL   BUN 18 6 - 20 mg/dL   Creatinine, Ser 1.00 0.61 - 1.24 mg/dL   Calcium 8.5 (L) 8.9 - 10.3 mg/dL   Total  Protein 6.6 6.5 - 8.1 g/dL   Albumin 2.6 (L) 3.5 - 5.0 g/dL   AST 24 15 - 41 U/L   ALT 26 17 - 63 U/L   Alkaline Phosphatase 99 38 - 126 U/L   Total Bilirubin 0.3 0.3 - 1.2 mg/dL   GFR calc non Af Amer >60 >60 mL/min   GFR calc Af Amer >60 >60 mL/min   Anion gap 7 5 - 15  Protime-INR  Result Value Ref Range   Prothrombin Time 14.3 11.4 - 15.2 seconds   INR 1.12   Type and screen  Result Value Ref Range   ABO/RH(D) O POS    Antibody Screen NEG    Sample Expiration 11/09/2017    Extend sample reason NO TRANSFUSIONS OR PREGNANCY IN THE PAST 3 MONTHS      Treatments: Canceled CT-guided thermal ablation of liver metastasis due to  new and enlarging pre-existing liver lesions on CT 11/06/17  Discharge Exam: Vitals:   11/06/17 1524 11/06/17 1530  BP:  (!) 143/77  Pulse:  84  Resp:  20  Temp:    SpO2: 94% 90%     Patient awake, alert.  Chest clear to auscultation bilaterally.  Clean, intact right chest wall Port-A-Cath.  Heart with normal rate, ectopy noted.  Soft, positive bowel sounds, currently nontender.  Bilateral lower extremity edema noted  Disposition: 01-Home or Self Care  Discharge Instructions    Call MD for:  difficulty breathing, headache or visual disturbances   Complete by:  As directed    Call MD for:  extreme fatigue   Complete by:  As directed    Call MD for:  hives   Complete by:  As directed    Call MD for:  persistant dizziness or light-headedness   Complete by:  As directed    Call MD for:  persistant nausea and vomiting   Complete by:  As directed    Call MD for:  redness, tenderness, or signs of infection (pain, swelling, redness, odor or green/yellow discharge around incision site)   Complete by:  As directed    Call MD for:  severe uncontrolled pain   Complete by:  As directed    Call MD for:  temperature >100.4   Complete by:  As directed    Diet - low sodium heart healthy   Complete by:  As directed    Discharge instructions   Complete by:   As directed    No driving for the next 24 hours; please keep follow-up  appointment with Dr. Marin Olp   Increase activity slowly   Complete by:  As directed    May shower / Bathe   Complete by:  As directed    May walk up steps   Complete by:  As directed      Allergies as of 11/06/2017      Reactions   Chlorhexidine Itching, Rash      Medication List    STOP taking these medications   amoxicillin-clavulanate 875-125 MG tablet Commonly known as:  AUGMENTIN   LORazepam 0.5 MG tablet Commonly known as:  ATIVAN   predniSONE 20 MG tablet Commonly known as:  DELTASONE     TAKE these medications   aspirin EC 81 MG tablet Take 81 mg by mouth at bedtime.   carvedilol 3.125 MG tablet Commonly known as:  COREG Take 1 tablet (3.125 mg total) by mouth 2 (two) times daily with a meal.   lidocaine-prilocaine cream Commonly known as:  EMLA Apply to affected area once What changed:    how much to take  how to take this  when to take this  reasons to take this  additional instructions   multivitamin with minerals Tabs tablet Take 1 tablet by mouth daily. Centrum Silver   ondansetron 8 MG tablet Commonly known as:  ZOFRAN Take 1 tablet (8 mg total) by mouth 2 (two) times daily as needed for refractory nausea / vomiting. Start on day 3 after carboplatin chemo.   prochlorperazine 10 MG tablet Commonly known as:  COMPAZINE Take 1 tablet (10 mg total) by mouth every 6 (six) hours as needed (Nausea or vomiting).   vitamin E 200 UNIT capsule Take 200 Units by mouth daily.      Follow-up Information    Volanda Napoleon, MD Follow up.   Specialty:  Oncology Why:  Please keep follow-up appointment with Dr. Marin Olp; call [phone] with any questions Contact information: [address] [place] [address] [phone]            Electronically Signed: D. Rowe Robert, PA-C 11/06/2017, 3:39 PM   I have spent less than 30 minutes discharging  Link Snuffer.

## 2017-11-06 NOTE — H&P (Signed)
Referring Physician(s): Ennever,P  Supervising Physician: Aletta Edouard  Patient Status:  [facility] OP TBA  Chief Complaint:  Metastatic lung cancer to liver  Subjective: Patient familiar to IR service from prior Port-A-Cath placement on 04/06/17 as well as segment 3 liver lesion biopsy on 08/18/17 revealing adenocarcinoma from lung primary.  He was seen in consultation by Dr. Kathlene Cote on 09/30/17 to discuss treatment options and was deemed an appropriate candidate for CT-guided thermal ablation of the segment 3 liver metastasis.  He presents today for the procedure.  He currently denies fever, headache, chest pain, abdominal/back pain, nausea, vomiting or bleeding. He does have hearing loss,  chronic dyspnea and occasional cough.  He is an ex-smoker.  Additional history as below. Past Medical History:  Diagnosis Date  . AKI (acute kidney injury) ([place]) 03/10/2017   NOTED ON ECHO  . Anemia    history of  . Aortic valve sclerosis 03/09/2017   NOTED ON ECHO  . Bronchitis   . Cancer ([place])     left lung CA  . Counseling regarding goals of care 03/26/2017  . Dyspnea   . GERD (gastroesophageal reflux disease)   . Hearing loss   . History of blood transfusion   . Hypertension   . LVH (left ventricular hypertrophy) 03/10/2017   mild, NOTED ON ECHO  . MR (mitral regurgitation)    trivial, NOTED ON ECHO  . Pneumonia   . Port-A-Cath in place   . Sinus problem   . Tingling    right foot   Past Surgical History:  Procedure Laterality Date  . COLONOSCOPY    . EYE SURGERY     bilateral cataracts in approx. 2016  . IR FLUORO GUIDE PORT INSERTION RIGHT  04/06/2017  . IR RADIOLOGIST EVAL & MGMT  09/30/2017  . IR US GUIDE VASC ACCESS RIGHT  04/06/2017  . LIVER BIOPSY    . TONSILLECTOMY     age 21  . VASECTOMY    . VIDEO BRONCHOSCOPY WITH ENDOBRONCHIAL ULTRASOUND  03/11/2017   Procedure: VIDEO BRONCHOSCOPY WITH ENDOBRONCHIAL ULTRASOUND;  Surgeon: Collene Gobble, MD;  Location: [facility] OR;   Service: Thoracic;;     Allergies: Chlorhexidine  Medications: Prior to Admission medications   Medication Sig Start Date End Date Taking? Authorizing Provider  amoxicillin-clavulanate (AUGMENTIN) 875-125 MG tablet Take 1 tablet by mouth 2 (two) times daily.   Yes [provider]  aspirin EC 81 MG tablet Take 81 mg by mouth at bedtime.    Yes [provider]  carvedilol (COREG) 3.125 MG tablet Take 1 tablet (3.125 mg total) by mouth 2 (two) times daily with a meal. 03/12/17  Yes Mikhail, [place], DO  Multiple Vitamin (MULTIVITAMIN WITH MINERALS) TABS tablet Take 1 tablet by mouth daily. Centrum Silver   Yes [provider]  vitamin E 200 UNIT capsule Take 200 Units by mouth daily.   Yes [provider]  lidocaine-prilocaine (EMLA) cream Apply to affected area once Patient taking differently: Apply 1 application topically daily as needed (prior to port being accessed.). Apply to affected area once 04/06/17   Ennever, Rudell Cobb, MD  LORazepam (ATIVAN) 0.5 MG tablet Take 1 tablet (0.5 mg total) by mouth every 6 (six) hours as needed (Nausea or vomiting). Patient not taking: Reported on 07/23/2017 04/06/17   Volanda Napoleon, MD  ondansetron (ZOFRAN) 8 MG tablet Take 1 tablet (8 mg total) by mouth 2 (two) times daily as needed for refractory nausea / vomiting. Start on  day 3 after carboplatin chemo. 04/06/17   Volanda Napoleon, MD  predniSONE (DELTASONE) 20 MG tablet Take 3 pills daily for 5 days, then 2 pills daily x 5 days then 1 pill a day. Patient not taking: Reported on 10/27/2017 09/11/17   Volanda Napoleon, MD  prochlorperazine (COMPAZINE) 10 MG tablet Take 1 tablet (10 mg total) by mouth every 6 (six) hours as needed (Nausea or vomiting). 04/06/17   Volanda Napoleon, MD     Vital Signs: Blood pressure 173/100, temp 97.8, heart rate 96, respirations 20, O2 sat 94% RA    Physical Exam awake, alert.  Chest with clear sounds bilaterally.  Clean, intact right  chest wall Port-A-Cath; heart with normal rate, ectopy noted, positive murmur; abdomen soft, positive bowel sounds, nontender.  Bilateral lower extremity edema noted Imaging: Dg Chest 1 View  Result Date: 11/04/2017 CLINICAL DATA:  Preop evaluation.  Left lung cancer EXAM: CHEST 1 VIEW COMPARISON:  10/19/2017 PET 07/20/2017, chest x-ray 03/09/2017 FINDINGS: Heart size within normal limits. Negative for heart failure. Port-A-Cath tip in the SVC Prominent lung markings diffusely related to chronic lung disease. Ill-defined density overlying the left hilum compatible with known mass lesion. No interval change. Negative for pneumonia or edema. IMPRESSION: Chronic lung disease with scarring Mass density overlying the left hilum compatible with known carcinoma unchanged. Port-A-Cath tip SVC unchanged Electronically Signed   By: Franchot Gallo M.D.   On: 11/04/2017 12:46    Labs:  CBC: Recent Labs    08/21/17 1048 09/11/17 1038 10/02/17 1022 11/04/17 1034  WBC 7.6 7.6 8.5 10.2  HGB 10.9* 11.8* 12.8* 11.5*  HCT 32.8* 35.7* 38.1* 35.3*  PLT 234 207 160 266    COAGS: Recent Labs    04/06/17 1233 08/04/17 1111 08/18/17 0630 11/04/17 1034  INR 0.98 0.97 1.11 1.12  APTT  --   --  31  --     BMP: Recent Labs    03/11/17 0607  04/06/17 1233  08/04/17 1111 08/21/17 1048 09/11/17 1038 10/02/17 1022 11/04/17 1034  NA 136   < > 132*   < > 134* 136 137 136 132*  K 5.2*   < > 4.5   < > 4.4 4.3 4.4 4.4 4.9  CL 102   < > 96*   < > 98* 96* 96* 96* 97*  CO2 28   < > 26   < > 27 29 30 28 28   GLUCOSE 118*   < > 99   < > 104* 104 101 139* 99  BUN 17   < > 23*   < > 20 17 18  28* 18  CALCIUM 8.7*   < > 9.0   < > 9.1 9.2 8.8 8.8 8.5*  CREATININE 1.14   < > 1.20   < > 0.99 1.1 0.8 1.1 1.00  GFRNONAA 58*  --  55*  --  >60  --   --   --  >60  GFRAA >60  --  >60  --  >60  --   --   --  >60   < > = values in this interval not displayed.    LIVER FUNCTION TESTS: Recent Labs    08/21/17 1048  09/11/17 1038 10/02/17 1022 11/04/17 1034  BILITOT 0.50 0.50 0.60 0.3  AST 26 23 17 24   ALT 20 22 30 26   ALKPHOS 94* 103* 92* 99  PROT 7.6 7.7 6.4 6.6  ALBUMIN 2.9* 2.8* 2.6* 2.6*  Assessment and Plan: Patient with hx metastatic adenocarcinoma of the lung to the liver;s/p consultation with Dr. Kathlene Cote on 09/30/17 to discuss treatment options.  He was deemed an appropriate candidate for CT-guided thermal ablation of the segment 3 liver metastasis and presents today for the procedure.  Details/risks of procedure, including but not limited to, internal bleeding, infection, injury to adjacent structures, anesthesia related complications discussed with patient/family with their understanding and consent.  Postprocedure he will be admitted to the hospital for overnight observation.This procedure involves the use of X-rays and because of the nature of the planned procedure, it is possible that we will have prolonged use of CT.  Potential radiation risks to you include (but are not limited to) the following: - A slightly elevated risk for cancer  several years later in life. This risk is typically less than 0.5% percent. This risk is low in comparison to the normal incidence of human cancer, which is 33% for women and 50% for men according to the [place]. - Radiation induced injury can include skin redness, resembling a rash, tissue breakdown / ulcers and hair loss (which can be temporary or permanent).   The likelihood of either of these occurring depends on the difficulty of the procedure and whether you are sensitive to radiation due to previous procedures, disease, or genetic conditions.   IF your procedure requires a prolonged use of radiation, you will be notified and given written instructions for further action.  It is your responsibility to monitor the irradiated area for the 2 weeks following the procedure and to notify your physician if you are concerned that you have  suffered a radiation induced injury.      Electronically Signed: D. Rowe Robert, PA-C 11/06/2017, 10:24 AM   I spent a total of 30 minutes at the the patient's bedside AND on the patient's hospital floor or unit, greater than 50% of which was counseling/coordinating care for CT-guided thermal ablation of liver tumor

## 2017-11-06 NOTE — Anesthesia Procedure Notes (Signed)
Procedure Name: Intubation Date/Time: 11/06/2017 1:24 PM Performed by: Deliah Boston, CRNA Pre-anesthesia Checklist: Patient identified, Emergency Drugs available, Suction available and Patient being monitored Patient Re-evaluated:Patient Re-evaluated prior to induction Oxygen Delivery Method: Circle system utilized Preoxygenation: Pre-oxygenation with 100% oxygen Induction Type: IV induction Ventilation: Mask ventilation without difficulty Laryngoscope Size: Mac and 4 Grade View: Grade I Tube type: Oral Tube size: 7.5 mm Number of attempts: 1 Airway Equipment and Method: Stylet and Oral airway Placement Confirmation: ETT inserted through vocal cords under direct vision,  positive ETCO2 and breath sounds checked- equal and bilateral Secured at: 23 cm Tube secured with: Tape Dental Injury: Teeth and Oropharynx as per pre-operative assessment

## 2017-11-06 NOTE — Anesthesia Procedure Notes (Signed)
Date/Time: 11/06/2017 2:34 PM Performed by: Cynda Familia, CRNA Oxygen Delivery Method: Simple face mask Placement Confirmation: positive ETCO2 and breath sounds checked- equal and bilateral Dental Injury: Teeth and Oropharynx as per pre-operative assessment  Comments: Extubated to face mask

## 2017-11-09 ENCOUNTER — Encounter (HOSPITAL_COMMUNITY): Payer: Self-pay | Admitting: Interventional Radiology

## 2017-11-09 NOTE — Anesthesia Postprocedure Evaluation (Signed)
Anesthesia Post Note  Patient: Harry Hinton  Procedure(s) Performed: CT MICROWAVE THERMAL ABLATION - LIVER (N/A )     Patient location during evaluation: PACU Anesthesia Type: General Level of consciousness: awake Pain management: pain level controlled Vital Signs Assessment: post-procedure vital signs reviewed and stable Respiratory status: spontaneous breathing Cardiovascular status: stable Postop Assessment: no apparent nausea or vomiting Anesthetic complications: no    Last Vitals:  Vitals:   11/06/17 1555 11/06/17 1604  BP: (!) 146/80 (!) (P) 144/69  Pulse: 83 (P) 79  Resp: 20 (P) 18  Temp: 36.5 C   SpO2:  (P) 91%    Last Pain:  Vitals:   11/06/17 1555  TempSrc: Oral   Pain Goal:                 Coralie Stanke JR,JOHN Dinisha Cai

## 2017-11-11 ENCOUNTER — Other Ambulatory Visit: Payer: Self-pay | Admitting: Hematology & Oncology

## 2017-11-11 ENCOUNTER — Other Ambulatory Visit: Payer: Self-pay | Admitting: *Deleted

## 2017-11-11 ENCOUNTER — Telehealth: Payer: Self-pay | Admitting: Pharmacist

## 2017-11-11 ENCOUNTER — Telehealth: Payer: Self-pay | Admitting: Hematology & Oncology

## 2017-11-11 MED ORDER — LAROTRECTINIB SULFATE 100 MG PO CAPS: 100.0000 mg | ORAL_CAPSULE | Freq: Two times a day (BID) | ORAL | 3 refills | Status: DC

## 2017-11-11 NOTE — Telephone Encounter (Addendum)
Oral Oncology Pharmacist Encounter  Received new prescription for Vitrakvi (larotrectinib). 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 Information was verified by speaking with the MSL for the company that developed larotrectinib. Information was shared with Dr. Marin Olp.   Darl Pikes, PharmD, BCPS Hematology/Oncology Clinical Pharmacist [facility] [phone]  11/13/2017 9:01 AM

## 2017-11-11 NOTE — Telephone Encounter (Addendum)
.  Oral Oncology Patient Advocate Encounter  Received notification from [place] that prior authorization for Vitrakvi is required.  PA submitted on CoverMyMeds Key [id number] Status is pending  Oral Oncology Clinic will continue to follow.   [place] Patient Advocate [phone] 11/11/2017 4:22 PM     Oral Oncology Patient Advocate Encounter  Prior Authorization for Cathi Roan has been approved.    [id number] Effective dates: 11/11/2017 through 10/12/2018  Oral Oncology Clinic will continue to follow.   Co-pay $5,056.97   [place] Patient Advocate [phone] 11/11/2017 4:24 PM

## 2017-11-12 ENCOUNTER — Telehealth: Payer: Self-pay | Admitting: Hematology & Oncology

## 2017-11-12 NOTE — Telephone Encounter (Signed)
Oral Oncology Patient Advocate Encounter  E-mailed application to Baxter Flattery at Dr. Antonieta Pert office to have patient and Dr. Marin Olp sign application for TrakAssistVitrakvi   in an effort to reduce patient's out of pocket expense for Vitrakvi to $0. I will fax to company when I get it back.  Application completed and faxed to [phone].   Patient assistance phone number for follow up is [phone].   This encounter will be updated until final determination.    [place] Patient Advocate [phone] 11/12/2017 11:00 AM

## 2017-11-13 ENCOUNTER — Other Ambulatory Visit: Payer: Self-pay

## 2017-11-13 ENCOUNTER — Encounter: Payer: Self-pay | Admitting: Hematology & Oncology

## 2017-11-13 ENCOUNTER — Inpatient Hospital Stay: Payer: Medicare Other | Attending: Hematology & Oncology | Admitting: Hematology & Oncology

## 2017-11-13 ENCOUNTER — Inpatient Hospital Stay: Payer: Medicare Other

## 2017-11-13 ENCOUNTER — Ambulatory Visit (HOSPITAL_BASED_OUTPATIENT_CLINIC_OR_DEPARTMENT_OTHER)
Admission: RE | Admit: 2017-11-13 | Discharge: 2017-11-13 | Disposition: A | Discharge status: Home / Self Care | Payer: Medicare Other | Source: Ambulatory Visit | Attending: Hematology & Oncology | Admitting: Hematology & Oncology

## 2017-11-13 DIAGNOSIS — C3491 Malignant neoplasm of unspecified part of right bronchus or lung: Secondary | ICD-10-CM | POA: Insufficient documentation

## 2017-11-13 DIAGNOSIS — R0602 Shortness of breath: Secondary | ICD-10-CM

## 2017-11-13 DIAGNOSIS — C3492 Malignant neoplasm of unspecified part of left bronchus or lung: Secondary | ICD-10-CM | POA: Insufficient documentation

## 2017-11-13 DIAGNOSIS — C3490 Malignant neoplasm of unspecified part of unspecified bronchus or lung: Secondary | ICD-10-CM | POA: Diagnosis not present

## 2017-11-13 DIAGNOSIS — I2510 Atherosclerotic heart disease of native coronary artery without angina pectoris: Secondary | ICD-10-CM | POA: Diagnosis not present

## 2017-11-13 DIAGNOSIS — J90 Pleural effusion, not elsewhere classified: Secondary | ICD-10-CM | POA: Diagnosis not present

## 2017-11-13 DIAGNOSIS — M8580 Other specified disorders of bone density and structure, unspecified site: Secondary | ICD-10-CM | POA: Insufficient documentation

## 2017-11-13 DIAGNOSIS — I471 Supraventricular tachycardia: Secondary | ICD-10-CM | POA: Insufficient documentation

## 2017-11-13 DIAGNOSIS — D509 Iron deficiency anemia, unspecified: Secondary | ICD-10-CM | POA: Diagnosis not present

## 2017-11-13 DIAGNOSIS — I700 Atherosclerosis of aorta: Secondary | ICD-10-CM | POA: Insufficient documentation

## 2017-11-13 DIAGNOSIS — M7989 Other specified soft tissue disorders: Secondary | ICD-10-CM | POA: Insufficient documentation

## 2017-11-13 DIAGNOSIS — J439 Emphysema, unspecified: Secondary | ICD-10-CM | POA: Diagnosis not present

## 2017-11-13 DIAGNOSIS — Z5111 Encounter for antineoplastic chemotherapy: Secondary | ICD-10-CM | POA: Diagnosis present

## 2017-11-13 DIAGNOSIS — R531 Weakness: Secondary | ICD-10-CM | POA: Insufficient documentation

## 2017-11-13 DIAGNOSIS — Z5112 Encounter for antineoplastic immunotherapy: Secondary | ICD-10-CM | POA: Insufficient documentation

## 2017-11-13 DIAGNOSIS — C3402 Malignant neoplasm of left main bronchus: Secondary | ICD-10-CM

## 2017-11-13 DIAGNOSIS — Z95828 Presence of other vascular implants and grafts: Secondary | ICD-10-CM

## 2017-11-13 DIAGNOSIS — D500 Iron deficiency anemia secondary to blood loss (chronic): Secondary | ICD-10-CM

## 2017-11-13 DIAGNOSIS — C787 Secondary malignant neoplasm of liver and intrahepatic bile duct: Secondary | ICD-10-CM

## 2017-11-13 HISTORY — DX: Malignant neoplasm of unspecified part of right bronchus or lung: C34.91

## 2017-11-13 LAB — CMP (CANCER CENTER ONLY)
ALBUMIN: 2.6 g/dL — AB (ref 3.5–5.0)
ALT: 28 U/L (ref 0–55)
AST: 27 U/L (ref 5–34)
Alkaline Phosphatase: 112 U/L — ABNORMAL HIGH (ref 26–84)
Anion gap: 8 (ref 5–15)
BUN: 20 mg/dL (ref 7–22)
CHLORIDE: 93 mmol/L — AB (ref 98–108)
CO2: 29 mmol/L (ref 18–33)
CREATININE: 1.1 mg/dL (ref 0.70–1.30)
Calcium: 9.2 mg/dL (ref 8.0–10.3)
Glucose, Bld: 126 mg/dL — ABNORMAL HIGH (ref 73–118)
POTASSIUM: 4.4 mmol/L (ref 3.3–4.7)
Sodium: 130 mmol/L (ref 128–145)
Total Bilirubin: 0.6 mg/dL (ref 0.2–1.2)
Total Protein: 6.9 g/dL (ref 6.4–8.1)

## 2017-11-13 LAB — CBC WITH DIFFERENTIAL (CANCER CENTER ONLY)
BASOS PCT: 0 %
Basophils Absolute: 0 10*3/uL (ref 0.0–0.1)
EOS ABS: 0 10*3/uL (ref 0.0–0.5)
Eosinophils Relative: 0 %
HEMATOCRIT: 34.8 % — AB (ref 38.7–49.9)
Hemoglobin: 11.8 g/dL — ABNORMAL LOW (ref 13.0–17.1)
Lymphocytes Relative: 7 %
Lymphs Abs: 0.7 10*3/uL — ABNORMAL LOW (ref 0.9–3.3)
MCH: 30.2 pg (ref 28.0–33.4)
MCHC: 33.9 g/dL (ref 32.0–35.9)
MCV: 89 fL (ref 82.0–98.0)
MONO ABS: 0.9 10*3/uL (ref 0.1–0.9)
MONOS PCT: 9 %
NEUTROS ABS: 8.7 10*3/uL — AB (ref 1.5–6.5)
Neutrophils Relative %: 84 %
Platelet Count: 239 10*3/uL (ref 145–400)
RBC: 3.91 MIL/uL — ABNORMAL LOW (ref 4.20–5.70)
RDW: 15.5 % (ref 11.1–15.7)
WBC Count: 10.3 10*3/uL — ABNORMAL HIGH (ref 4.0–10.0)

## 2017-11-13 LAB — LACTATE DEHYDROGENASE: LDH: 235 U/L (ref 125–245)

## 2017-11-13 MED ORDER — CYANOCOBALAMIN 1000 MCG/ML IJ SOLN
1000.0000 ug | Freq: Once | INTRAMUSCULAR | Status: AC
  Administered 2017-11-13: 1000 ug via INTRAMUSCULAR

## 2017-11-13 MED ORDER — DRONABINOL 2.5 MG PO CAPS: 2.5000 mg | ORAL_CAPSULE | Freq: Two times a day (BID) | ORAL | 0 refills | Status: DC

## 2017-11-13 MED ORDER — CYANOCOBALAMIN 1000 MCG/ML IJ SOLN
INTRAMUSCULAR | Status: AC
  Filled 2017-11-13: qty 1

## 2017-11-13 MED ORDER — PREDNISONE 20 MG PO TABS: ORAL_TABLET | ORAL | 1 refills | Status: DC

## 2017-11-13 MED ORDER — SODIUM CHLORIDE 0.9% FLUSH
10.0000 mL | INTRAVENOUS | Status: AC | PRN
  Administered 2017-11-13: 10 mL via INTRAVENOUS
  Filled 2017-11-13: qty 10

## 2017-11-13 MED ORDER — IOPAMIDOL (ISOVUE-370) INJECTION 76%
125.0000 mL | Freq: Once | INTRAVENOUS | Status: AC | PRN
  Administered 2017-11-13: 125 mL via INTRAVENOUS

## 2017-11-13 MED ORDER — IPRATROPIUM-ALBUTEROL 0.5-2.5 (3) MG/3ML IN SOLN
3.0000 mL | Freq: Once | RESPIRATORY_TRACT | Status: AC
  Administered 2017-11-13: 3 mL via RESPIRATORY_TRACT

## 2017-11-13 MED ORDER — HEPARIN SOD (PORK) LOCK FLUSH 100 UNIT/ML IV SOLN
500.0000 [IU] | Freq: Once | INTRAVENOUS | Status: AC
  Administered 2017-11-13: 500 [IU] via INTRAVENOUS
  Filled 2017-11-13: qty 5

## 2017-11-13 MED ORDER — MOXIFLOXACIN HCL 400 MG PO TABS: 400.0000 mg | ORAL_TABLET | Freq: Every day | ORAL | 0 refills | Status: DC

## 2017-11-13 MED ORDER — FUROSEMIDE 20 MG PO TABS: 40.0000 mg | ORAL_TABLET | Freq: Every day | ORAL | 2 refills | Status: DC

## 2017-11-13 NOTE — Telephone Encounter (Signed)
Oral Oncology Patient Advocate Encounter   No longer pursuing this medication for the patient. Will not need to get application signed. Sent e-mail to Rulon Abide at Dr. Antonieta Pert office.    [place] Patient Advocate [phone] 11/13/2017 9:06 AM

## 2017-11-13 NOTE — Progress Notes (Signed)
START ON PATHWAY REGIMEN - Non-Small Cell Lung     A cycle is every 21 days:     Pembrolizumab      Pemetrexed      Carboplatin   **Always confirm dose/schedule in your pharmacy ordering system**    Patient Characteristics: Stage IV Metastatic, Nonsquamous, Initial Chemotherapy/Immunotherapy, PS = 0, 1, PD-L1 Expression Positive 1-49% (TPS) / Negative / Not Tested / Awaiting Test Results AJCC T Category: T2a Current Disease Status: Distant Metastases AJCC N Category: NX AJCC M Category: M1c AJCC 8 Stage Grouping: IVB Histology: Nonsquamous Cell ROS1 Rearrangement Status: Negative T790M Mutation Status: Not Applicable - EGFR Mutation Negative/Unknown Other Mutations/Biomarkers: No Other Actionable Mutations PD-L1 Expression Status: PD-L1 Negative Chemotherapy/Immunotherapy LOT: Initial Chemotherapy/Immunotherapy Molecular Targeted Therapy: Not Appropriate ALK Translocation Status: Negative Would you be surprised if this patient died  in the next year<= I would be surprised if this patient died in the next year EGFR Mutation Status: Negative/Wild Type BRAF V600E Mutation Status: Negative Performance Status: PS = 0, 1 Intent of Therapy: Non-Curative / Palliative Intent, Discussed with Patient

## 2017-11-13 NOTE — Addendum Note (Signed)
Addended by: Smiley Houseman F on: 11/13/2017 02:13 PM   Modules accepted: Orders, SmartSet
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
## 2017-11-16 LAB — IRON AND TIBC
IRON: 32 ug/dL — AB (ref 42–163)
SATURATION RATIOS: 20 % — AB (ref 42–163)
TIBC: 161 ug/dL — AB (ref 202–409)
UIBC: 129 ug/dL

## 2017-11-16 LAB — FERRITIN: FERRITIN: 2550 ng/mL — AB (ref 22–316)

## 2017-11-17 ENCOUNTER — Telehealth: Payer: Self-pay | Admitting: *Deleted

## 2017-11-17 NOTE — Telephone Encounter (Addendum)
Patient is aware of results. Appointment made  ----- Message from Volanda Napoleon, MD sent at 11/16/2017  4:07 PM EST ----- Call - iron is still low!!  please set up a dose of Feraheme for this week.  Laurey Arrow

## 2017-11-18 ENCOUNTER — Encounter (HOSPITAL_COMMUNITY): Payer: Self-pay | Admitting: Internal Medicine

## 2017-11-18 ENCOUNTER — Inpatient Hospital Stay (HOSPITAL_COMMUNITY): Payer: Medicare Other

## 2017-11-18 ENCOUNTER — Other Ambulatory Visit: Payer: Self-pay

## 2017-11-18 ENCOUNTER — Emergency Department (HOSPITAL_COMMUNITY): Payer: Medicare Other

## 2017-11-18 ENCOUNTER — Inpatient Hospital Stay (HOSPITAL_COMMUNITY)
Admission: EM | Admit: 2017-11-18 | Discharge: 2017-11-22 | DRG: 291 | Disposition: A | Discharge status: Home Health | Payer: Medicare Other | Attending: Oncology | Admitting: Oncology

## 2017-11-18 ENCOUNTER — Ambulatory Visit: Payer: Medicare Other

## 2017-11-18 DIAGNOSIS — C787 Secondary malignant neoplasm of liver and intrahepatic bile duct: Secondary | ICD-10-CM | POA: Diagnosis present

## 2017-11-18 DIAGNOSIS — Z9221 Personal history of antineoplastic chemotherapy: Secondary | ICD-10-CM

## 2017-11-18 DIAGNOSIS — Z66 Do not resuscitate: Secondary | ICD-10-CM | POA: Diagnosis present

## 2017-11-18 DIAGNOSIS — J439 Emphysema, unspecified: Secondary | ICD-10-CM | POA: Diagnosis not present

## 2017-11-18 DIAGNOSIS — C7802 Secondary malignant neoplasm of left lung: Secondary | ICD-10-CM | POA: Diagnosis not present

## 2017-11-18 DIAGNOSIS — I351 Nonrheumatic aortic (valve) insufficiency: Secondary | ICD-10-CM | POA: Diagnosis not present

## 2017-11-18 DIAGNOSIS — R600 Localized edema: Secondary | ICD-10-CM | POA: Diagnosis not present

## 2017-11-18 DIAGNOSIS — C7801 Secondary malignant neoplasm of right lung: Secondary | ICD-10-CM | POA: Diagnosis not present

## 2017-11-18 DIAGNOSIS — I10 Essential (primary) hypertension: Secondary | ICD-10-CM | POA: Diagnosis present

## 2017-11-18 DIAGNOSIS — I248 Other forms of acute ischemic heart disease: Secondary | ICD-10-CM | POA: Diagnosis present

## 2017-11-18 DIAGNOSIS — J700 Acute pulmonary manifestations due to radiation: Secondary | ICD-10-CM | POA: Diagnosis present

## 2017-11-18 DIAGNOSIS — E871 Hypo-osmolality and hyponatremia: Secondary | ICD-10-CM

## 2017-11-18 DIAGNOSIS — E876 Hypokalemia: Secondary | ICD-10-CM | POA: Diagnosis not present

## 2017-11-18 DIAGNOSIS — K219 Gastro-esophageal reflux disease without esophagitis: Secondary | ICD-10-CM | POA: Diagnosis present

## 2017-11-18 DIAGNOSIS — I5041 Acute combined systolic (congestive) and diastolic (congestive) heart failure: Secondary | ICD-10-CM

## 2017-11-18 DIAGNOSIS — Z79899 Other long term (current) drug therapy: Secondary | ICD-10-CM

## 2017-11-18 DIAGNOSIS — Z87891 Personal history of nicotine dependence: Secondary | ICD-10-CM

## 2017-11-18 DIAGNOSIS — C3490 Malignant neoplasm of unspecified part of unspecified bronchus or lung: Secondary | ICD-10-CM

## 2017-11-18 DIAGNOSIS — C3492 Malignant neoplasm of unspecified part of left bronchus or lung: Secondary | ICD-10-CM | POA: Diagnosis present

## 2017-11-18 DIAGNOSIS — I080 Rheumatic disorders of both mitral and aortic valves: Secondary | ICD-10-CM | POA: Diagnosis present

## 2017-11-18 DIAGNOSIS — Z883 Allergy status to other anti-infective agents status: Secondary | ICD-10-CM | POA: Diagnosis not present

## 2017-11-18 DIAGNOSIS — H9190 Unspecified hearing loss, unspecified ear: Secondary | ICD-10-CM | POA: Diagnosis present

## 2017-11-18 DIAGNOSIS — Z923 Personal history of irradiation: Secondary | ICD-10-CM | POA: Diagnosis not present

## 2017-11-18 DIAGNOSIS — I509 Heart failure, unspecified: Secondary | ICD-10-CM

## 2017-11-18 DIAGNOSIS — Y842 Radiological procedure and radiotherapy as the cause of abnormal reaction of the patient, or of later complication, without mention of misadventure at the time of the procedure: Secondary | ICD-10-CM | POA: Diagnosis present

## 2017-11-18 DIAGNOSIS — Z7982 Long term (current) use of aspirin: Secondary | ICD-10-CM | POA: Diagnosis not present

## 2017-11-18 DIAGNOSIS — J9601 Acute respiratory failure with hypoxia: Secondary | ICD-10-CM | POA: Diagnosis present

## 2017-11-18 DIAGNOSIS — J438 Other emphysema: Secondary | ICD-10-CM

## 2017-11-18 DIAGNOSIS — I5033 Acute on chronic diastolic (congestive) heart failure: Secondary | ICD-10-CM | POA: Diagnosis not present

## 2017-11-18 DIAGNOSIS — I5021 Acute systolic (congestive) heart failure: Secondary | ICD-10-CM | POA: Diagnosis not present

## 2017-11-18 DIAGNOSIS — I110 Hypertensive heart disease with heart failure: Secondary | ICD-10-CM | POA: Diagnosis present

## 2017-11-18 DIAGNOSIS — I5043 Acute on chronic combined systolic (congestive) and diastolic (congestive) heart failure: Secondary | ICD-10-CM | POA: Diagnosis present

## 2017-11-18 DIAGNOSIS — E222 Syndrome of inappropriate secretion of antidiuretic hormone: Secondary | ICD-10-CM | POA: Diagnosis present

## 2017-11-18 DIAGNOSIS — I428 Other cardiomyopathies: Secondary | ICD-10-CM | POA: Diagnosis present

## 2017-11-18 DIAGNOSIS — C3491 Malignant neoplasm of unspecified part of right bronchus or lung: Secondary | ICD-10-CM | POA: Diagnosis not present

## 2017-11-18 DIAGNOSIS — C3402 Malignant neoplasm of left main bronchus: Secondary | ICD-10-CM

## 2017-11-18 DIAGNOSIS — R0603 Acute respiratory distress: Secondary | ICD-10-CM

## 2017-11-18 LAB — TROPONIN I
TROPONIN I: 0.79 ng/mL — AB (ref <0.03)
TROPONIN I: 0.61 ng/mL — AB (ref <0.03)

## 2017-11-18 LAB — BASIC METABOLIC PANEL
ANION GAP: 12 (ref 5–15)
ANION GAP: 14 (ref 5–15)
BUN: 27 mg/dL — ABNORMAL HIGH (ref 6–20)
BUN: 25 mg/dL — ABNORMAL HIGH (ref 6–20)
CO2: 27 mmol/L (ref 22–32)
CO2: 28 mmol/L (ref 22–32)
Calcium: 8.4 mg/dL — ABNORMAL LOW (ref 8.9–10.3)
Calcium: 8 mg/dL — ABNORMAL LOW (ref 8.9–10.3)
Chloride: 84 mmol/L — ABNORMAL LOW (ref 101–111)
Chloride: 85 mmol/L — ABNORMAL LOW (ref 101–111)
Creatinine, Ser: 1.04 mg/dL (ref 0.61–1.24)
Creatinine, Ser: 1.05 mg/dL (ref 0.61–1.24)
GFR calc Af Amer: >60 mL/min (ref >60)
GFR calc non Af Amer: >60 mL/min (ref >60)
GLUCOSE: 145 mg/dL — AB (ref 65–99)
GLUCOSE: 138 mg/dL — AB (ref 65–99)
POTASSIUM: 3.9 mmol/L (ref 3.5–5.1)
POTASSIUM: 3.6 mmol/L (ref 3.5–5.1)
Sodium: 123 mmol/L — ABNORMAL LOW (ref 135–145)
Sodium: 127 mmol/L — ABNORMAL LOW (ref 135–145)

## 2017-11-18 LAB — CBC
HEMATOCRIT: 37.7 % — AB (ref 39.0–52.0)
Hemoglobin: 12.8 g/dL — ABNORMAL LOW (ref 13.0–17.0)
MCH: 29.6 pg (ref 26.0–34.0)
MCHC: 34 g/dL (ref 30.0–36.0)
MCV: 87.3 fL (ref 78.0–100.0)
Platelets: 292 10*3/uL (ref 150–400)
RBC: 4.32 MIL/uL (ref 4.22–5.81)
RDW: 15.4 % (ref 11.5–15.5)
WBC: 15.6 10*3/uL — AB (ref 4.0–10.5)

## 2017-11-18 LAB — I-STAT CG4 LACTIC ACID, ED: LACTIC ACID, VENOUS: 1.42 mmol/L (ref 0.5–1.9)

## 2017-11-18 LAB — I-STAT TROPONIN, ED: Troponin i, poc: 0.07 ng/mL (ref 0.00–0.08)

## 2017-11-18 LAB — COMPREHENSIVE METABOLIC PANEL
ALT: 37 U/L (ref 17–63)
AST: 28 U/L (ref 15–41)
Albumin: 2.7 g/dL — ABNORMAL LOW (ref 3.5–5.0)
Alkaline Phosphatase: 110 U/L (ref 38–126)
Anion gap: 15 (ref 5–15)
BUN: 28 mg/dL — ABNORMAL HIGH (ref 6–20)
CHLORIDE: 82 mmol/L — AB (ref 101–111)
CO2: 25 mmol/L (ref 22–32)
CREATININE: 1.06 mg/dL (ref 0.61–1.24)
Calcium: 9 mg/dL (ref 8.9–10.3)
Glucose, Bld: 153 mg/dL — ABNORMAL HIGH (ref 65–99)
POTASSIUM: 4.3 mmol/L (ref 3.5–5.1)
SODIUM: 122 mmol/L — AB (ref 135–145)
Total Bilirubin: 0.6 mg/dL (ref 0.3–1.2)
Total Protein: 6.5 g/dL (ref 6.5–8.1)

## 2017-11-18 LAB — ECHOCARDIOGRAM COMPLETE

## 2017-11-18 LAB — BRAIN NATRIURETIC PEPTIDE: B NATRIURETIC PEPTIDE 5: 1669.6 pg/mL — AB (ref 0.0–100.0)

## 2017-11-18 MED ORDER — METHYLPREDNISOLONE SODIUM SUCC 125 MG IJ SOLR
125.0000 mg | Freq: Once | INTRAMUSCULAR | Status: AC
  Administered 2017-11-18: 125 mg via INTRAVENOUS
  Filled 2017-11-18: qty 2

## 2017-11-18 MED ORDER — ASPIRIN EC 81 MG PO TBEC
81.0000 mg | DELAYED_RELEASE_TABLET | Freq: Every day | ORAL | Status: DC
  Administered 2017-11-18 – 2017-11-21 (×4): 81 mg via ORAL
  Filled 2017-11-18 (×4): qty 1

## 2017-11-18 MED ORDER — FUROSEMIDE 10 MG/ML IJ SOLN
INTRAMUSCULAR | Status: AC
  Filled 2017-11-18: qty 4

## 2017-11-18 MED ORDER — FUROSEMIDE 10 MG/ML IJ SOLN
60.0000 mg | Freq: Two times a day (BID) | INTRAMUSCULAR | Status: DC
  Administered 2017-11-18 – 2017-11-21 (×6): 60 mg via INTRAVENOUS
  Filled 2017-11-18 (×7): qty 6

## 2017-11-18 MED ORDER — FUROSEMIDE 10 MG/ML IJ SOLN
60.0000 mg | Freq: Once | INTRAMUSCULAR | Status: AC
  Administered 2017-11-18: 60 mg via INTRAVENOUS
  Filled 2017-11-18: qty 6

## 2017-11-18 MED ORDER — ADULT MULTIVITAMIN W/MINERALS CH
1.0000 | ORAL_TABLET | Freq: Every day | ORAL | Status: DC
  Administered 2017-11-19 – 2017-11-22 (×4): 1 via ORAL
  Filled 2017-11-18 (×4): qty 1

## 2017-11-18 MED ORDER — NITROGLYCERIN IN D5W 200-5 MCG/ML-% IV SOLN
0.0000 ug/min | Freq: Once | INTRAVENOUS | Status: AC
  Administered 2017-11-18: 5 ug/min via INTRAVENOUS
  Filled 2017-11-18: qty 250

## 2017-11-18 MED ORDER — ENOXAPARIN SODIUM 40 MG/0.4ML ~~LOC~~ SOLN
40.0000 mg | SUBCUTANEOUS | Status: DC
  Administered 2017-11-18 – 2017-11-21 (×4): 40 mg via SUBCUTANEOUS
  Filled 2017-11-18 (×5): qty 0.4

## 2017-11-18 MED ORDER — CARVEDILOL 3.125 MG PO TABS
3.1250 mg | ORAL_TABLET | Freq: Two times a day (BID) | ORAL | Status: DC
  Administered 2017-11-18 – 2017-11-22 (×8): 3.125 mg via ORAL
  Filled 2017-11-18 (×10): qty 1

## 2017-11-18 MED ORDER — NITROGLYCERIN IN D5W 200-5 MCG/ML-% IV SOLN: 0.0000 ug/min | Freq: Once | INTRAVENOUS | Status: DC | PRN

## 2017-11-18 MED ORDER — DRONABINOL 2.5 MG PO CAPS
2.5000 mg | ORAL_CAPSULE | Freq: Two times a day (BID) | ORAL | Status: DC
  Administered 2017-11-19 – 2017-11-22 (×7): 2.5 mg via ORAL
  Filled 2017-11-18 (×7): qty 1

## 2017-11-18 NOTE — Progress Notes (Signed)
  Echocardiogram 2D Echocardiogram has been performed.  Harry Hinton T Lopaka Karge 11/18/2017, 1:28 PM

## 2017-11-18 NOTE — ED Notes (Signed)
Allevyn foam applied to bridge of nose

## 2017-11-18 NOTE — ED Notes (Signed)
ED Provider at bedside discussing care with pt and family

## 2017-11-18 NOTE — ED Notes (Signed)
Cards provider at bedside

## 2017-11-18 NOTE — ED Provider Notes (Signed)
[facility] EMERGENCY DEPARTMENT Provider Note   CSN: [id number] Arrival date & time: 11/18/17  3220     History   Chief Complaint Chief Complaint  Patient presents with  . Shortness of Breath    HPI Harry Hinton is a 84 y.o. male.  Patient is an 84 year old gentleman with a history of lung cancer with metastases to the liver, mitral regurgitation which was trivial on past echoes, LVH who has not had any chemo or radiation for the last 5 months who is presenting today with worsening shortness of breath.  Patient saw his doctor on Friday for routine visit but since that time has had gradually worsening shortness of breath.  His shortness of breath is worse with exertion, lying flat.  He does not usually wear oxygen at home but when EMS arrived oxygen saturation were 82% on room air and he did improve with nasal cannula oxygen.  Patient has had a mild cough but no infectious symptoms such as fever, sputum.  He denies any chest pain.  In the last 2-3 weeks he has noticed worsening swelling in his bilateral lower extremities.  He does not check his weight regularly and has no documented history of CHF.  Patient is a former smoker but does not use any inhalers at home. He denies any abdominal pain, nausea or vomiting.   The history is provided by the patient and the EMS personnel.  Shortness of Breath  This is a new problem. The average episode lasts 5 days. The problem occurs continuously.Episode onset: 5 days. The problem has been gradually worsening. Associated symptoms include wheezing, PND, orthopnea and leg swelling. Pertinent negatives include no fever, no rhinorrhea, no sore throat, no cough, no sputum production, no chest pain, no abdominal pain and no leg pain. It is unknown what precipitated the problem. He has tried nothing for the symptoms. Associated medical issues include pneumonia and chronic lung disease. Associated medical issues comments: lung cancer.    Past  Medical History:  Diagnosis Date  . 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 AKI (acute kidney injury) ([place]) 03/10/2017   NOTED ON ECHO  . Anemia    history of  . Aortic valve sclerosis 03/09/2017   NOTED ON ECHO  . Bronchitis   . Cancer ([place])     left lung CA  . Counseling regarding goals of care 03/26/2017  . Dyspnea   . GERD (gastroesophageal reflux disease)   . Hearing loss   . History of blood transfusion   . Hypertension   . Lung cancer, primary, with metastasis from lung to other site, right ([place]) 11/13/2017  . LVH (left ventricular hypertrophy) 03/10/2017   mild, NOTED ON ECHO  . MR (mitral regurgitation)    trivial, NOTED ON ECHO  . Pneumonia   . Port-A-Cath in place   . Sinus problem   . Tingling    right foot    Patient Active Problem List   Diagnosis Date Noted  . Lung cancer, primary, with metastasis from lung to other site, right ([place]) 11/13/2017  . 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 IDA (iron deficiency anemia) 08/26/2017  . Counseling regarding goals of care 03/26/2017  . Abdominal aortic aneurysm dissection ([place])   . AKI (acute kidney injury) ([place]) 03/10/2017  . Tobacco abuse 03/10/2017  . Dizziness 03/10/2017  . Hyponatremia 03/10/2017  . Essential hypertension 03/09/2017  . Atypical chest pain 03/09/2017  . Malignant neoplasm  of hilus of left lung ([place]) 03/09/2017  . Cough with hemoptysis 03/09/2017    Past Surgical History:  Procedure Laterality Date  . COLONOSCOPY    . EYE SURGERY     bilateral cataracts in approx. 2016  . IR FLUORO GUIDE PORT INSERTION RIGHT  04/06/2017  . IR RADIOLOGIST EVAL & MGMT  09/30/2017  . IR US GUIDE VASC ACCESS RIGHT  04/06/2017  . LIVER BIOPSY    . RADIOLOGY WITH ANESTHESIA N/A 11/06/2017   Procedure: CT MICROWAVE THERMAL ABLATION - LIVER;  Surgeon: Aletta Edouard, MD;  Location: [facility];  Service: Radiology;  Laterality: N/A;  . TONSILLECTOMY     age 88   . VASECTOMY    . VIDEO BRONCHOSCOPY WITH ENDOBRONCHIAL ULTRASOUND  03/11/2017   Procedure: VIDEO BRONCHOSCOPY WITH ENDOBRONCHIAL ULTRASOUND;  Surgeon: Collene Gobble, MD;  Location: [facility];  Service: Thoracic;;       Home Medications    Prior to Admission medications   Medication Sig Start Date End Date Taking? Authorizing Provider  aspirin EC 81 MG tablet Take 81 mg by mouth at bedtime.     [provider]  carvedilol (COREG) 3.125 MG tablet Take 1 tablet (3.125 mg total) by mouth 2 (two) times daily with a meal. 03/12/17   Mikhail, Velta Addison, DO  dronabinol (MARINOL) 2.5 MG capsule Take 1 capsule (2.5 mg total) by mouth 2 (two) times daily before lunch and supper. 11/13/17   Volanda Napoleon, MD  furosemide (LASIX) 20 MG tablet Take 2 tablets (40 mg total) by mouth daily. 11/13/17   Volanda Napoleon, MD  Larotrectinib Sulfate (VITRAKVI) 100 MG CAPS Take 100 mg by mouth 2 (two) times daily with a meal. 11/11/17   Ennever, Rudell Cobb, MD  moxifloxacin (AVELOX) 400 MG tablet Take 1 tablet (400 mg total) by mouth daily at 8 pm. 11/13/17   Volanda Napoleon, MD  Multiple Vitamin (MULTIVITAMIN WITH MINERALS) TABS tablet Take 1 tablet by mouth daily. Centrum Silver    [provider]  predniSONE (DELTASONE) 20 MG tablet Take 4 pills daily x 3 days, then 3 pills daily x 7 days, then 2 pills daily with food 11/13/17   Volanda Napoleon, MD  vitamin E 200 UNIT capsule Take 200 Units by mouth daily.    [provider]  prochlorperazine (COMPAZINE) 10 MG tablet Take 1 tablet (10 mg total) by mouth every 6 (six) hours as needed (Nausea or vomiting). 04/06/17 11/13/17  Volanda Napoleon, MD    Family History No family history on file.  Social History Social History   Tobacco Use  . Smoking status: Former Smoker    Types: Cigarettes    Last attempt to quit: 03/10/1987    Years since quitting: 30.7  . Smokeless tobacco: Never Used  Substance Use Topics  . Alcohol use: Yes    Comment:  occasional beer or occasional wine  . Drug use: No     Allergies   Chlorhexidine   Review of Systems Review of Systems  Constitutional: Negative for fever.  HENT: Negative for rhinorrhea and sore throat.   Respiratory: Positive for shortness of breath and wheezing. Negative for cough and sputum production.   Cardiovascular: Positive for orthopnea, leg swelling and PND. Negative for chest pain.  Gastrointestinal: Negative for abdominal pain.  All other systems reviewed and are negative.    Physical Exam Updated Vital Signs BP 118/83   Pulse 93   Temp 97.6 F (36.4 C) (Oral)  Resp 16   SpO2 99%   Physical Exam  Constitutional: He is oriented to person, place, and time. He appears well-developed and well-nourished. He appears distressed.  HENT:  Head: Normocephalic and atraumatic.  Mouth/Throat: Oropharynx is clear and moist.  Eyes: Conjunctivae and EOM are normal. Pupils are equal, round, and reactive to light.  Neck: Normal range of motion. Neck supple.  Cardiovascular: Regular rhythm and intact distal pulses. Tachycardia present.  No murmur heard. Pulmonary/Chest: Accessory muscle usage present. Tachypnea noted. No respiratory distress. He has wheezes. He has rales in the right lower field and the left lower field.  Abdominal: Soft. He exhibits distension. There is no tenderness. There is no rebound and no guarding.  Musculoskeletal: Normal range of motion. He exhibits edema. He exhibits no tenderness.  3+ pitting edema bilateral lower extremities up to the knee  Neurological: He is alert and oriented to person, place, and time.  Skin: Skin is warm. No rash noted. He is diaphoretic. No erythema.  Psychiatric: He has a normal mood and affect. His behavior is normal.  Nursing note and vitals reviewed.    ED Treatments / Results  Labs (all labs ordered are listed, but only abnormal results are displayed) Labs Reviewed  CBC - Abnormal; Notable for the following  components:      Result Value   WBC 15.6 (*)    Hemoglobin 12.8 (*)    HCT 37.7 (*)    All other components within normal limits  BRAIN NATRIURETIC PEPTIDE - Abnormal; Notable for the following components:   B Natriuretic Peptide 1,669.6 (*)    All other components within normal limits  COMPREHENSIVE METABOLIC PANEL - Abnormal; Notable for the following components:   Sodium 122 (*)    Chloride 82 (*)    Glucose, Bld 153 (*)    BUN 28 (*)    Albumin 2.7 (*)    All other components within normal limits  CULTURE, BLOOD (ROUTINE X 2)  CULTURE, BLOOD (ROUTINE X 2)  I-STAT TROPONIN, ED  I-STAT CG4 LACTIC ACID, ED    EKG  EKG Interpretation  Date/Time:  Wednesday November 18 2017 07:40:04 EST Ventricular Rate:  118 PR Interval:    QRS Duration: 96 QT Interval:  261 QTC Calculation: 366 R Axis:   73 Text Interpretation:  Sinus tachycardia Atrial premature complex Probable anterior infarct, age indeterminate Lateral leads are also involved , new T wave inversion Anterolateral leads Confirmed by Blanchie Dessert [phone]) on 11/18/2017 8:42:19 AM       Radiology Dg Chest Portable 1 View  Result Date: 11/18/2017 CLINICAL DATA:  Shortness of breath. History of left sided lung carcinoma EXAM: PORTABLE CHEST 1 VIEW COMPARISON:  Chest radiograph November 04, 2017 and chest CT November 13, 2017 FINDINGS: There is extensive fibrotic change bilaterally there is a small left pleural effusion. There is apparent radiation therapy change in the left hilar region. There is no frank edema or consolidation. Heart size is within normal limits. Pulmonary vascularity is within normal limits. No adenopathy is demonstrable by radiography. Port-A-Cath tip is at the cavoatrial junction. No pneumothorax. IMPRESSION: Areas of radiation therapy change on the left. Fibrosis bilaterally with small left pleural effusion. No well-defined mass seen by radiography. No new opacity. Stable cardiac silhouette. Electronically  Signed   By: Lowella Grip III M.D.   On: 11/18/2017 08:14    Procedures Procedures (including critical care time)  Medications Ordered in ED Medications - No data to display   Initial  Impression / Assessment and Plan / ED Course  I have reviewed the triage vital signs and the nursing notes.  Pertinent labs & imaging results that were available during my care of the patient were reviewed by me and considered in my medical decision making (see chart for details).     Elderly male presenting today with shortness of breath concerns for fluid overload as patient has JVD, distal edema, wheezing, tachypnea.  On oxygen patient saturations 91-94% but he still appears distressed.  Will start BiPAP.  Initial blood pressure was extremely high at 200+/100+ but will recheck.  If this continues we will start a nitroglycerin drip.  Patient has no prior history of using inhalers or COPD but possible that it is inflammation, lower suspicion for PE at this time.  Patient denies any infectious symptoms suggestive of pneumonia. Discussing with the patient he did wish to be intubated if necessary but confirmed that he is a DNR. CBC, CMP, BNP, troponin, chest x-ray, EKG pending.  7:51 AM BP remained elevated and pt started on NTG gtt. also family arrived and states on Friday he saw Dr. Marin Olp and was diagnosed with radiation pneumonitis.  He had a CTA which was negative for PE but showed worsening radiation pneumonitis on the left also small opacity in the right concerning for possible drug toxicity but also the potential pneumonia.  He was started on prednisone and avelox but symptoms do not seem to be improving.  Because of the swelling in his legs on Friday he was also started on Lasix.  8:32 AM Patient seems to be responding to BiPAP with improvement in heart rate and work of breathing. Pt was also given solumedrol.  9:00 AM Patient responded to nitroglycerin drip and now pressure is 113/78.  We will  stop nitro drip at this time and continue with BiPAP.    10:25 AM Patient remains comfortable on BiPAP with improved vital signs.  Labs are consistent with CHF with a BNP of 1600, mild leukocytosis of 15,000 however that may be from recent prednisone initiation.  Troponin within normal limits, lactic acid within normal limits.  Will hold on antibiotics at this time.  Will start IV Lasix and admit to medicine with cardiology consult.  CRITICAL CARE Performed by: Denean Pavon Total critical care time: 45 minutes Critical care time was exclusive of separately billable procedures and treating other patients. Critical care was necessary to treat or prevent imminent or life-threatening deterioration. Critical care was time spent personally by me on the following activities: development of treatment plan with patient and/or surrogate as well as nursing, discussions with consultants, evaluation of patient's response to treatment, examination of patient, obtaining history from patient or surrogate, ordering and performing treatments and interventions, ordering and review of laboratory studies, ordering and review of radiographic studies, pulse oximetry and re-evaluation of patient's condition.   Final Clinical Impressions(s) / ED Diagnoses   Final diagnoses:  Acute congestive heart failure, unspecified heart failure type [facility])  Respiratory distress    ED Discharge Orders    None       Blanchie Dessert, MD 11/18/17 1026

## 2017-11-18 NOTE — Addendum Note (Signed)
Addended by: Darl Pikes on: 11/18/2017 03:01 PM   Modules accepted: Orders

## 2017-11-18 NOTE — ED Triage Notes (Signed)
Per EMS- pt reports worsening SOB since Friday. Pt has reported lung and liver cancer. Mass reported in left lung. Pt has wheezing throughout. No medications given today. Pt was placed on 4L by EMS for initial spo2 of 82%

## 2017-11-18 NOTE — Consult Note (Signed)
Cardiology Consultation:   Patient ID: Harry Hinton; [id number]; [birth year]   Admit date: 11/18/2017 Date of Consult: 11/18/2017  Primary Care Provider: Myrlene Broker, MD Primary Cardiologist: New to [facility], Dr. Irish Lack Primary Electrophysiologist:  None   Patient Profile:   Harry Hinton is a 84 y.o. male with PMH of non-small cell lung CA with metastases to the liver, HTN, chronic diastolic CHF on last echo 03/10/17, and tobacco abuse, who is being seen today for the evaluation of acute on chronic diastolic heart failure at the request of Dr. Eppie Gibson.  History of Present Illness:   Harry Hinton is a 84 y.o. male with PMH of non-small cell lung CA with metastases to the liver s/p chemo and radiation, HTN, chronic diastolic CHF on last echo 03/10/17, and tobacco abuse who presents with worsening SOB x5 days. He was last seen by his oncologist, Dr. Marin Olp, on 11/13/17, at which time he was complaining of SOB. Patient underwent a CT angiogram which was negative for PE, however did show evidence of post-radiation pneumonitis, concern for progression of central LLL lesion, and possible PNA vs drug toxicity. Given these findings, he was started on prednisone and Moxifloxacin to complete a 7 day course. He was also noted to have LE edema at this visit and started on lasix 40mg  daily. Decision made to pursue an additional round of chemotherapy in 1-2 weeks.   Patient currently on BiPAP, therefore history primarily obtained by family at bedside. Family states patient had been doing well until 1.5 weeks when he presented for microablation of his liver lesion, which was ultimately cancelled due to multiple lesions being seen on CT scan. Since this time he has had progressively worsening SOB, orthopnea, and LE edema. He was previously able to perform ADLs and go to the grocery store without issues, however has been significantly limited due to DOE over the past week. He has been sleeping in a recliner and  notes one episode of PND in the past week. He denies any chest pain at rest or with exertion. States he has been compliant with daily po lasix since 11/13/17 however did not notice any improvement in breathing. EMS was activated this AM and patient was found to be hypoxic on RA at 82%. ROS notable for nasal congestion. Denies fever, cough, abdominal pain, N/V/D, constipation, urinary frequency. Patient reports feeling better since presentation to the ED and family reports patient looks more comfortable.   ED course: Intermittently hypertensive, tachycardic, and tachypneic, afebrile, satting in the 90s on BiPAP. Labs notable for hyponatremia, Cr 1.06, K 4.3, WBC 15.6, Hgb 12.8, Lactate wnl, Trop 0.07, BNP >1600. CXR with extensive fibrotic changes, small L pleural effusion. EKG with sinus tachycardia with ST changes in anterolateral leads. HTN initially managed with nitro gtt, however returned upon discontinuation. Given IV lasix 60mg  x1 for presumed CHF exacerbation. Given IV steroids for pneumonitis. Admitted to medicine with cardiology consulting for further management.   Past Medical History:  Diagnosis Date  . Adenocarcinoma of lung metastatic to liver, right ([place]) 11/13/2017  . AKI (acute kidney injury) ([place]) 03/10/2017   NOTED ON ECHO  . Anemia    history of  . Aortic valve sclerosis 03/09/2017   NOTED ON ECHO  . Bronchitis   . Cancer ([place])     left lung CA  . Counseling regarding goals of care 03/26/2017  . Dyspnea   . GERD (gastroesophageal reflux disease)   . Hearing loss   . History of blood transfusion   .  Hypertension   . Lung cancer, primary, with metastasis from lung to other site, right ([place]) 11/13/2017  . LVH (left ventricular hypertrophy) 03/10/2017   mild, NOTED ON ECHO  . MR (mitral regurgitation)    trivial, NOTED ON ECHO  . Pneumonia   . Port-A-Cath in place   . Sinus problem   . Tingling    right foot    Past Surgical History:  Procedure Laterality Date  .  COLONOSCOPY    . EYE SURGERY     bilateral cataracts in approx. 2016  . IR FLUORO GUIDE PORT INSERTION RIGHT  04/06/2017  . IR RADIOLOGIST EVAL & MGMT  09/30/2017  . IR US GUIDE VASC ACCESS RIGHT  04/06/2017  . LIVER BIOPSY    . RADIOLOGY WITH ANESTHESIA N/A 11/06/2017   Procedure: CT MICROWAVE THERMAL ABLATION - LIVER;  Surgeon: Aletta Edouard, MD;  Location: [facility];  Service: Radiology;  Laterality: N/A;  . TONSILLECTOMY     age 27  . VASECTOMY    . VIDEO BRONCHOSCOPY WITH ENDOBRONCHIAL ULTRASOUND  03/11/2017   Procedure: VIDEO BRONCHOSCOPY WITH ENDOBRONCHIAL ULTRASOUND;  Surgeon: Collene Gobble, MD;  Location: [facility];  Service: Thoracic;;     Home Medications:  Prior to Admission medications   Medication Sig Start Date End Date Taking? Authorizing Provider  aspirin EC 81 MG tablet Take 81 mg by mouth at bedtime.    Yes [provider]  carvedilol (COREG) 3.125 MG tablet Take 1 tablet (3.125 mg total) by mouth 2 (two) times daily with a meal. 03/12/17  Yes Mikhail, [place], DO  dronabinol (MARINOL) 2.5 MG capsule Take 1 capsule (2.5 mg total) by mouth 2 (two) times daily before lunch and supper. 11/13/17  Yes Volanda Napoleon, MD  furosemide (LASIX) 20 MG tablet Take 2 tablets (40 mg total) by mouth daily. 11/13/17  Yes Volanda Napoleon, MD  moxifloxacin (AVELOX) 400 MG tablet Take 1 tablet (400 mg total) by mouth daily at 8 pm. 11/13/17  Yes Ennever, Rudell Cobb, MD  Multiple Vitamin (MULTIVITAMIN WITH MINERALS) TABS tablet Take 1 tablet by mouth daily. Centrum Silver   Yes [provider]  predniSONE (DELTASONE) 20 MG tablet Take 4 pills daily x 3 days, then 3 pills daily x 7 days, then 2 pills daily with food 11/13/17  Yes Ennever, Rudell Cobb, MD  vitamin E 200 UNIT capsule Take 200 Units by mouth daily.   Yes [provider]  Larotrectinib Sulfate (VITRAKVI) 100 MG CAPS Take 100 mg by mouth 2 (two) times daily with a meal. Patient not taking: Reported on 11/18/2017 11/11/17    Volanda Napoleon, MD  prochlorperazine (COMPAZINE) 10 MG tablet Take 1 tablet (10 mg total) by mouth every 6 (six) hours as needed (Nausea or vomiting). 04/06/17 11/13/17  Volanda Napoleon, MD    Inpatient Medications: Scheduled Meds: . aspirin EC  81 mg Oral QHS  . carvedilol  3.125 mg Oral BID WC  . [START ON 11/19/2017] dronabinol  2.5 mg Oral BID AC  . enoxaparin (LOVENOX) injection  40 mg Subcutaneous Q24H  . furosemide  60 mg Intravenous Q12H  . [START ON 11/19/2017] multivitamin with minerals  1 tablet Oral Daily   Continuous Infusions: . nitroGLYCERIN     PRN Meds: nitroGLYCERIN  Allergies:    Allergies  Allergen Reactions  . Chlorhexidine Itching and Rash    Social History:   Social History   Socioeconomic History  . Marital status: Married  Spouse name: Not on file  . Number of children: Not on file  . Years of education: Not on file  . Highest education level: Not on file  Social Needs  . Financial resource strain: Not on file  . Food insecurity - worry: Not on file  . Food insecurity - inability: Not on file  . Transportation needs - medical: Not on file  . Transportation needs - non-medical: Not on file  Occupational History  . Not on file  Tobacco Use  . Smoking status: Former Smoker    Types: Cigarettes    Last attempt to quit: 03/10/1987    Years since quitting: 30.7  . Smokeless tobacco: Never Used  Substance and Sexual Activity  . Alcohol use: Yes    Comment: occasional beer or occasional wine  . Drug use: No  . Sexual activity: Not on file  Other Topics Concern  . Not on file  Social History Narrative  . Not on file    Family History:    Family History  Problem Relation Age of Onset  . Heart attack Father 57  . Congestive Heart Failure Father      ROS:  Please see the history of present illness.   All other ROS reviewed and negative.     Physical Exam/Data:   Vitals:   11/18/17 1230 11/18/17 1245 11/18/17 1300 11/18/17 1315  BP:  115/80 110/73 123/82 (!) 162/67  Pulse: 97 87 90 91  Resp: (!) 38 15 (!) 24 (!) 25  Temp:      TempSrc:      SpO2: 97% 96% 96% 99%   No intake or output data in the 24 hours ending 11/18/17 1355 There were no vitals filed for this visit. There is no height or weight on file to calculate BMI.  General:  Elderly gentleman laying in bed with BiPAP on, in no acute distress HEENT: sclera anicteric   Neck: +JVD Vascular: No carotid bruits; distal pulses 2+ bilaterally Cardiac:  normal S1, S2; RRR; no murmur, gallops, or rubs Lungs:  Crackles at lung bases, trace wheezing, no rhonchi Abd: NABS, soft, nontender, no hepatomegaly Ext: 3+ edema to knees Musculoskeletal:  No deformities Skin: warm and dry  Neuro:  CNs 2-12 intact, no focal abnormalities noted Psych:  Normal affect   EKG:  The EKG was personally reviewed and demonstrates:  EKG with sinus tachycardia with ST changes in anterolateral leads.  Telemetry:  Telemetry was personally reviewed and demonstrates:  Sinus rhythm with PACs, intermittent sinus tachycardia  Relevant CV Studies: ECHO 03/10/17:  Study Conclusions  - Left ventricle: The cavity size was normal. Wall thickness was   increased in a pattern of mild LVH. Systolic function was normal.   The estimated ejection fraction was in the range of 55% to 60%.   Wall motion was normal; there were no regional wall motion   abnormalities. Doppler parameters are consistent with abnormal   left ventricular relaxation (grade 1 diastolic dysfunction). The   E/e&' ratio is between 8-15, suggesting indeterminate LV filling   pressure. - Aortic valve: Trileaflet. Sclerosis without stenosis. There was   no regurgitation. - Mitral valve: Calcified annulus. Mildly thickened leaflets .   There was trivial regurgitation. - Left atrium: The atrium was normal in size. - Inferior vena cava: The vessel was normal in size. The   respirophasic diameter changes were in the normal range (>=  50%),   consistent with normal central venous pressure.  Impressions:  -  LVEF 55-60%, mild LVH, normal wall motion, grade 1 DD,   indeterminate LV filling pressure, aortic valve sclerosis,   trivial MR, normal LA size, normal IVC.  Laboratory Data:  Chemistry Recent Labs  Lab 11/13/17 1145 11/18/17 0804  NA 130 122*  K 4.4 4.3  CL 93* 82*  CO2 29 25  GLUCOSE 126* 153*  BUN 20 28*  CREATININE 1.10 1.06  CALCIUM 9.2 9.0  GFRNONAA  --  >60  GFRAA  --  >60  ANIONGAP 8 15    Recent Labs  Lab 11/13/17 1145 11/18/17 0804  PROT 6.9 6.5  ALBUMIN 2.6* 2.7*  AST 27 28  ALT 28 37  ALKPHOS 112* 110  BILITOT 0.6 0.6   Hematology Recent Labs  Lab 11/13/17 1145 11/18/17 0804  WBC 10.3* 15.6*  RBC 3.91* 4.32  HGB  --  12.8*  HCT 34.8* 37.7*  MCV 89.0 87.3  MCH 30.2 29.6  MCHC 33.9 34.0  RDW 15.5 15.4  PLT 239 292   Cardiac EnzymesNo results for input(s): TROPONINI in the last 168 hours.  Recent Labs  Lab 11/18/17 0822  TROPIPOC 0.07    BNP Recent Labs  Lab 11/18/17 0804  BNP 1,669.6*    DDimer No results for input(s): DDIMER in the last 168 hours.  Radiology/Studies:  Dg Chest Portable 1 View  Result Date: 11/18/2017 CLINICAL DATA:  Shortness of breath. History of left sided lung carcinoma EXAM: PORTABLE CHEST 1 VIEW COMPARISON:  Chest radiograph November 04, 2017 and chest CT November 13, 2017 FINDINGS: There is extensive fibrotic change bilaterally there is a small left pleural effusion. There is apparent radiation therapy change in the left hilar region. There is no frank edema or consolidation. Heart size is within normal limits. Pulmonary vascularity is within normal limits. No adenopathy is demonstrable by radiography. Port-A-Cath tip is at the cavoatrial junction. No pneumothorax. IMPRESSION: Areas of radiation therapy change on the left. Fibrosis bilaterally with small left pleural effusion. No well-defined mass seen by radiography. No new opacity. Stable  cardiac silhouette. Electronically Signed   By: Lowella Grip III M.D.   On: 11/18/2017 08:14    Assessment and Plan:   1. Acute on chronic diastolic CHF: patient presents with worsening SOB and LE edema x5 days. Last echo 03/10/17 with EF 55-60% and grade 1 diastolic dysfunction. On exam patient appears to be significantly overloaded with 3+ pitting edema of b/l LE and crackles at lung bases.  - BNP >1600 - CXR without overt pulmonary edema; however significant fibrotic changes - Mild troponin elevation of 0.07 - likely demand ischemia in setting of CHF exacerbation, tachycardia, and HTN. Would continue to trend to peak - Echo pending - Agree with aggressive diuresis - continue IV lasix 60mg  BID - Compression stockings - Monitor strict I&Os - foley catheter placed in ED; daily weights - Continue coreg 3.125 BID  2. Abnormal EKG: acute anterolateral ST changes in the setting of tachycardia and acute on chronic diastolic CHF. EKG with rate 118, TWI in V5-6, submm STE in V2-3, and STD V4. Patient denies any chest pain either today or in the weeks leading up to this admission.  - Troponin 0.07 - would trend to peak - Repeat EKG when HR improved.  - Echo pending - will assess for changes in LVEF or wall motion abnormalities.   2. Acute respiratory failure: likely multifactorial. Previously without O2 requirements at home. Anticipate respiratory status will improve with IV diuresis.  - Wean off  BiPAP as tolerated - Management per primary team  3. HTN: BP improved with nitro gtt. - Continue coreg 3.125 BID    4. Pneumonitis: recent CTA with evidence of post-radiation pneumonitis - s/p IV steroids in the ED - Management per primary team  5. Metastatic lung cancer: non-small cell lung CA with mets to the liver. Plans for additional chemotherapy noted in last Oncology follow-up note.  - Management per primary team  6. Hyponatremia: Na 130 on 11/13/17, now 122 - Management per primary  team  7. Leukocytosis: WBC 15.6, likely 2/2 recent steroids started 11/13/17. No obvious PNA on CXR.  - Management per primary team.    For questions or updates, please contact [place] Please consult [email or web address] for contact info under Cardiology/STEMI.   Signed, Abigail Butts, PA-C  11/18/2017 1:55 PM [phone]  I have examined the patient and reviewed assessment and plan and discussed with patient.  Agree with above as stated.  Patietn quite volume overloaded on exam with 3+ pitting edema.  COntinue with efforts to diurese.  Low albumin likley also contributing.  Acute on chronic diastolic heart failure presumably.  Echo pending.  Will follow.  Encourage PO intake.  Larae Grooms

## 2017-11-18 NOTE — H&P (Signed)
Date: 11/18/2017               Patient Name:  Harry Hinton MRN: [id number]  DOB: 01-10-1933 Age / Sex: 84 y.o., male   PCP: Myrlene Broker, MD         Medical Service: Internal Medicine Teaching Service         Attending Physician: Dr. Oval Linsey, MD    First Contact: Dr. Shan Levans Pager: [phone]  Second Contact: Dr. Reesa Chew Pager: [phone]       After Hours (After 5p/  First Contact Pager: [phone]  weekends / holidays): Second Contact Pager: [phone]   Chief Complaint: Shortness of breath  History of Present Illness: Mr .Tangonan is a 84 year old man with a history of metastatic lung adenocarincoma from the left lower lobe involving mediastinum and liver s/p carboplatin/etoposide and local radiation last year, hypertension, abdominal aortic dissection who presents to the ED today with worsening shortness of breath. This problem has been ongoing for at least a week but maybe going back to when he was seen on 1/7 by Dr. Unk Lightning with cough. He has noticed increasing lower extremity swelling which was entirely absent late last year. He has a nonproductive cough without associated chest pain. His appetite remains poor but has no nausea or diarrhea. He does not describe overt fever or chills. His shortness of breath is problematic sleeping at night and his exertional tolerance is decreased to walking from one room to the next. He has had stable urinary hesitancy and nocturia 4x nightly unchanged for over a year. He was seen by his oncologist Dr. Marin Olp on Friday 2/1 with CTA checked to exclude PE and started on moxifloxacin, prednisone, lasix, and nebulized breathing treatment. Unfortunately symptoms progressively worsened since that time. He was found to be in respiratory distress and hypoxic to 82% in the ED. Blood pressure was initially very high over 564 systolic. Symptoms were partially improved by my visit after starting Bipap and nitroglycerin infusion.  Meds:  Current Meds  Medication  Sig  . aspirin EC 81 MG tablet Take 81 mg by mouth at bedtime.   . carvedilol (COREG) 3.125 MG tablet Take 1 tablet (3.125 mg total) by mouth 2 (two) times daily with a meal.  . dronabinol (MARINOL) 2.5 MG capsule Take 1 capsule (2.5 mg total) by mouth 2 (two) times daily before lunch and supper.  . furosemide (LASIX) 20 MG tablet Take 2 tablets (40 mg total) by mouth daily.  Marland Kitchen moxifloxacin (AVELOX) 400 MG tablet Take 1 tablet (400 mg total) by mouth daily at 8 pm.  . Multiple Vitamin (MULTIVITAMIN WITH MINERALS) TABS tablet Take 1 tablet by mouth daily. Centrum Silver  . predniSONE (DELTASONE) 20 MG tablet Take 4 pills daily x 3 days, then 3 pills daily x 7 days, then 2 pills daily with food  . vitamin E 200 UNIT capsule Take 200 Units by mouth daily.     Allergies: Allergies as of 11/18/2017 - Review Complete 11/13/2017  Allergen Reaction Noted  . Chlorhexidine Itching and Rash 09/17/2017   Past Medical History:  Diagnosis Date  . Adenocarcinoma of lung metastatic to liver, right ([place]) 11/13/2017  . AKI (acute kidney injury) ([place]) 03/10/2017   NOTED ON ECHO  . Anemia    history of  . Aortic valve sclerosis 03/09/2017   NOTED ON ECHO  . Bronchitis   . Cancer ([place])     left lung CA  . Counseling regarding goals of  care 03/26/2017  . Dyspnea   . GERD (gastroesophageal reflux disease)   . Hearing loss   . History of blood transfusion   . Hypertension   . Lung cancer, primary, with metastasis from lung to other site, right ([place]) 11/13/2017  . LVH (left ventricular hypertrophy) 03/10/2017   mild, NOTED ON ECHO  . MR (mitral regurgitation)    trivial, NOTED ON ECHO  . Pneumonia   . Port-A-Cath in place   . Sinus problem   . Tingling    right foot    Family History:  Family History  Problem Relation Age of Onset  . Heart attack Father 90  . Congestive Heart Failure Father     Social History:  Former smoker stopped > 20 yrs ago Occasional alcohol use, none in >1  month   Review of Systems: Review of Systems  Constitutional: Negative for chills and fever.  HENT: Negative for congestion.   Eyes: Negative for blurred vision and redness.  Respiratory: Positive for cough, shortness of breath and wheezing. Negative for hemoptysis and sputum production.   Cardiovascular: Positive for leg swelling and PND. Negative for chest pain.  Gastrointestinal: Negative for diarrhea and nausea.  Genitourinary: Negative for dysuria and hematuria.  Musculoskeletal: Negative for myalgias.  Skin: Negative for rash.  Neurological: Positive for dizziness.  Endo/Heme/Allergies: Negative for polydipsia.    Physical Exam: Blood pressure (!) 162/67, pulse 91, temperature 97.6 F (36.4 C), temperature source Oral, resp. rate (!) 25, SpO2 99 %. GENERAL- alert, co-operative, uncomfortable appearing HEENT- Bipap mask in place, no JVD, no cervical lymphadenopathy CARDIAC- Tachycardic with regular rhythm, no murmur, palpable peripheral pulses Bedside ultrasound did not reveal an obvious pericardial effusion RESP- Inspiratory crackles are present in bilateral lower lung fields R > L, faint wheezes, increased WOB and tachypnea ABDOMEN- Soft, nontender, no abdominal wall edema NEURO- Strength upper and lower extremities- 5/5, Sensation intact globally EXTREMITIES- 3+ pitting edema of the legs to the knees, cool but not cyanotic and upper extremities are warm SKIN- Somewhat friable skin with purpura on forearms, no rashes PSYCH- Tired appearing but appropriate mood, affect, and speech   EKG: personally reviewed my interpretation is sinus tachycardia at 116 bpm with lateral T wave inversions new from 11/06/17 tracing  CXR: personally reviewed my interpretation is bilateral markings similar to previous film with new opacity obscuring left heart border and a small left effusion  Assessment & Plan by Problem: Congestive heart failure (CHF) ([place]) He appears to be in heart failure  with volume overload based on extensive lower extremity edema, BNP of 1669.6, increased left pleural effusion, and acute hyponatremia. His weight is increased despite poor appetite at home. Chest xray does not show obvious vascular congestion and interstitial changes are fibrotic. He does not have JVD on clinical exam or obvious S3. He had a TTE in 02/2017 showing normal LVEF with grade 1 diastolic dysfunction and trivial mitral regurgitation. He has no other known history of heart disease. Etiology is unclear--he has risk factors for silent MI and EKG could be consistent with previous anterior infarct, carboplatin therapy or mediastinal radiation pose risk for cardiomyopathy, his metastatic lung cancer also places him at risk for malignant pericardial effusion. Given his severity of illness aggressive diuresis is needed. -ASA 81mg  -Lasix 60mg  IV BID -Bipap with supplemental O2 to goal >92% -TTE today -Nitro gtt until adequate BP control -Cardiac monitoring -Check post void residual x1 -Repeat troponin I  Hyponatremia Na of 122 acutely down from 130  5 days ago. I suspect this is just hypervolemic hyponatremia but he has risk for SIADH or other metabolic derangements. -Check Bmet q6hrs for initial 24 to monitor on loop diuretics  Lung cancer, primary, with metastasis from lung to other site, right [facility] Disease was stage IIIb on diagnosis last year treated with carboplatin/etoposide and local radiation with subsequent metastases to right and left liver. He was unable to get RFA of the liver lesion last month due to enlargement and extension. Currently not on any medications.  Essential hypertension Currently hypertensive out of proportion to missing morning meds. Nitro gtt and diuresis for volume control. -Continue home Coreg 3.125mg   Leukocytosis 15.6 on admission, more likely 2/2 prednisone PTA versus infectious or inflammatory process. Repeat CBC in AM or consider treating if recurrent fever.  Left lung opacity could still reflect postobstructive infection that has failed to resolve.  Dispo: Admit patient to Inpatient with expected length of stay greater than 2 midnights.  Diet: NPO except med son Bipap, can advance when respiratory status improves VTE ppx: [place] enoxaparin DNR - would consider short term intubation depending on situation  Signed: Collier Salina, MD PGY-III Internal Medicine Resident Pager# [phone] 11/18/2017, 1:59 PM

## 2017-11-19 ENCOUNTER — Other Ambulatory Visit: Payer: Self-pay

## 2017-11-19 DIAGNOSIS — I110 Hypertensive heart disease with heart failure: Principal | ICD-10-CM

## 2017-11-19 DIAGNOSIS — I5043 Acute on chronic combined systolic (congestive) and diastolic (congestive) heart failure: Secondary | ICD-10-CM

## 2017-11-19 DIAGNOSIS — R600 Localized edema: Secondary | ICD-10-CM

## 2017-11-19 DIAGNOSIS — I5021 Acute systolic (congestive) heart failure: Secondary | ICD-10-CM

## 2017-11-19 DIAGNOSIS — C3490 Malignant neoplasm of unspecified part of unspecified bronchus or lung: Secondary | ICD-10-CM

## 2017-11-19 DIAGNOSIS — C3492 Malignant neoplasm of unspecified part of left bronchus or lung: Secondary | ICD-10-CM

## 2017-11-19 LAB — CBC
HCT: 34.3 % — ABNORMAL LOW (ref 39.0–52.0)
HEMOGLOBIN: 11.5 g/dL — AB (ref 13.0–17.0)
MCH: 29.4 pg (ref 26.0–34.0)
MCHC: 33.5 g/dL (ref 30.0–36.0)
MCV: 87.7 fL (ref 78.0–100.0)
Platelets: 232 10*3/uL (ref 150–400)
RBC: 3.91 MIL/uL — ABNORMAL LOW (ref 4.22–5.81)
RDW: 15.9 % — AB (ref 11.5–15.5)
WBC: 12.3 10*3/uL — ABNORMAL HIGH (ref 4.0–10.5)

## 2017-11-19 LAB — BASIC METABOLIC PANEL
Anion gap: 13 (ref 5–15)
Anion gap: 13 (ref 5–15)
BUN: 28 mg/dL — AB (ref 6–20)
BUN: 27 mg/dL — AB (ref 6–20)
CALCIUM: 8.2 mg/dL — AB (ref 8.9–10.3)
CALCIUM: 8.4 mg/dL — AB (ref 8.9–10.3)
CHLORIDE: 83 mmol/L — AB (ref 101–111)
CO2: 30 mmol/L (ref 22–32)
CO2: 32 mmol/L (ref 22–32)
Chloride: 84 mmol/L — ABNORMAL LOW (ref 101–111)
Creatinine, Ser: 1.21 mg/dL (ref 0.61–1.24)
Creatinine, Ser: 1.09 mg/dL (ref 0.61–1.24)
GFR calc Af Amer: >60 mL/min (ref >60)
GFR calc non Af Amer: >60 mL/min (ref >60)
GFR, EST NON AFRICAN AMERICAN: 54 mL/min — AB (ref >60)
GLUCOSE: 101 mg/dL — AB (ref 65–99)
Glucose, Bld: 100 mg/dL — ABNORMAL HIGH (ref 65–99)
POTASSIUM: 4.5 mmol/L (ref 3.5–5.1)
Potassium: 3.3 mmol/L — ABNORMAL LOW (ref 3.5–5.1)
SODIUM: 127 mmol/L — AB (ref 135–145)
SODIUM: 128 mmol/L — AB (ref 135–145)

## 2017-11-19 LAB — TROPONIN I: TROPONIN I: 0.48 ng/mL — AB (ref <0.03)

## 2017-11-19 LAB — MRSA PCR SCREENING: MRSA BY PCR: NEGATIVE

## 2017-11-19 MED ORDER — POTASSIUM CHLORIDE CRYS ER 20 MEQ PO TBCR
40.0000 meq | EXTENDED_RELEASE_TABLET | Freq: Once | ORAL | Status: AC
  Administered 2017-11-19: 40 meq via ORAL
  Filled 2017-11-19: qty 2

## 2017-11-19 MED ORDER — LOSARTAN POTASSIUM 25 MG PO TABS
25.0000 mg | ORAL_TABLET | Freq: Every day | ORAL | Status: DC
  Administered 2017-11-19 – 2017-11-21 (×3): 25 mg via ORAL
  Filled 2017-11-19 (×3): qty 1

## 2017-11-19 MED ORDER — ORAL CARE MOUTH RINSE
15.0000 mL | Freq: Two times a day (BID) | OROMUCOSAL | Status: DC
  Administered 2017-11-19 – 2017-11-22 (×7): 15 mL via OROMUCOSAL

## 2017-11-19 MED ORDER — POTASSIUM CHLORIDE CRYS ER 20 MEQ PO TBCR
30.0000 meq | EXTENDED_RELEASE_TABLET | Freq: Once | ORAL | Status: AC
  Administered 2017-11-19: 30 meq via ORAL
  Filled 2017-11-19: qty 1

## 2017-11-19 NOTE — Progress Notes (Signed)
Internal Medicine Attending  Date: 11/19/2017  Patient name: Harry Hinton Medical record number: [id number] Date of birth: Dec 07, 1932 Age: 84 y.o. Gender: male  I saw and evaluated the patient. I reviewed the resident's note by Dr. Shan Levans and I agree with the resident's findings and plans as documented in his progress note.  Please see my H&P dated 11/19/2017 for the specifics my evaluation, assessment, and plan from earlier in the day.

## 2017-11-19 NOTE — H&P (Signed)
Internal Medicine Attending Admission Note Date: 11/19/2017  Patient name: Harry Hinton Medical record number: [id number] Date of birth: [birth year] Age: 84 y.o. Gender: male  I saw and evaluated the patient. I reviewed the resident's note and I agree with the resident's findings and plan as documented in the resident's note.  Chief Complaint(s): Progressive lower extremity edema and shortness of breath 2 weeks.  History - key components related to admission:  Mr. 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 He was in his usual state of health until approximately 2 weeks ago when he developed the onset of a nonproductive cough. This was followed by the development of bilateral lower extremity edema which worse progressive. He subsequently over the last week or so has developed increasing shortness of breath initially his dyspnea on exertion and then worsening dyspnea at rest. Over the last 48 hours prior to admission he had to sleep in a recliner secondary to orthopnea and paroxysmal nocturnal dyspnea. He presented to the emergency department where he was found to have room air hypoxemia 08 01% and a systolic blood pressure over 200. He was admitted to the internal medicine teaching service for further evaluation and care.  In the emergency department he was diuresed and placed on BiPAP therapy to decrease his work of breathing. He denied any chest pain over this entire period. When seen on rounds the morning after admission he noted marked improvement in his dyspnea, although he still had a nonproductive cough. He was without other acute complaints.  Physical Exam - key components related to  admission:  Vitals:   11/19/17 1230 11/19/17 1300 11/19/17 1400 11/19/17 1500  BP: 111/66 118/66 117/76 (!) 106/57  Pulse: 79 79 96 91  Resp: (!) 25 (!) 25 17 17   Temp:      TempSrc:      SpO2: 95% 97% 92% 96%   Gen.: Well-developed, well-nourished, man sitting upright in bed in no acute distress. Neck: Jugular venous pressure of 13 cm of water. Lungs: Bilateral inspiratory crackles without rhonchi. Heart: Frequent extrasystoles. Abdomen: Soft, nontender, without guarding or rebound. Extremities: 2+ pitting edema underneath the knee-high compression stockings. Skin: Senile purpura  Lab results:  Basic Metabolic Panel: Recent Labs    11/19/17 0203 11/19/17 0631  NA 127* 128*  K 4.5 3.3*  CL 84* 83*  CO2 30 32  GLUCOSE 101* 100*  BUN 28* 27*  CREATININE 1.21 1.09  CALCIUM 8.2* 8.4*   Liver Function Tests: Recent Labs    11/18/17 0804  AST 28  ALT 37  ALKPHOS 110  BILITOT 0.6  PROT 6.5  ALBUMIN 2.7*   CBC: Recent Labs    11/18/17 0804 11/19/17 0414  WBC 15.6* 12.3*  HGB 12.8* 11.5*  HCT 37.7* 34.3*  MCV 87.3 87.7  PLT 292 232   Cardiac Enzymes: Recent Labs    11/18/17 1357 11/18/17 1926 11/19/17 0203  TROPONINI 0.79* 0.61* 0.48*   Misc. Labs:  BNP 1669.6 Lactic acid 1.42  Imaging results:  Dg Chest Portable 1 View  Result Date: 11/18/2017 CLINICAL DATA:  Shortness of breath. History of left sided lung carcinoma EXAM: PORTABLE CHEST 1 VIEW COMPARISON:  Chest radiograph November 04, 2017 and chest CT November 13, 2017 FINDINGS: There  is extensive fibrotic change bilaterally there is a small left pleural effusion. There is apparent radiation therapy change in the left hilar region. There is no frank edema or consolidation. Heart size is within normal limits. Pulmonary vascularity is within normal limits. No adenopathy is demonstrable by radiography. Port-A-Cath tip is at the cavoatrial junction. No pneumothorax. IMPRESSION: Areas of radiation therapy  change on the left. Fibrosis bilaterally with small left pleural effusion. No well-defined mass seen by radiography. No new opacity. Stable cardiac silhouette. Electronically Signed   By: Lowella Grip III M.D.   On: 11/18/2017 08:14   AP portable chest x-ray: Personally reviewed. Poor inspiratory result, increased interstitial markings paramediastinum, questionable left basilar infiltrate. Left basilar scarring progressed from the previous chest x-ray on 04/04/2018.  Other results:  EKG (11/18/2017): Personally reviewed. Sinus tachycardia, normal axis, normal intervals, no significant Q waves, no LVH by voltage, good R wave progression, 2 mm ST per depression in leads V4 through V5, 1 mm ST depression in leads V6, lateral T wave inversions in a strain pattern. Compared to the previous ECG on 11/06/2017 the lateral T wave inversions in a strain pattern are new.  EKG (11/19/2017): Personally reviewed. Normal sinus arrhythmia at 96 bpm with resolution of the lateral T wave inversions in a strain pattern seen on the ECG on 11/18/2017.  Assessment & Plan by Problem:  Mr. 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 He was found to be volume overloaded with a decreased left ventricular ejection fraction and grade 2 diastolic dysfunction. Decrement in the left ventricular ejection fraction was new compared to the previous echo from May 2019. I suspect that the decrease in left ventricular function may be secondary to the toxic effects of platinum chemotherapy although we cannot completely rule out ischemic disease given the bump in the troponin to 0.79. Thus, I believe this is acute on chronic combined systolic and  diastolic heart failure. Symptomatically he has responded well to IV diuresis. There may be a component of radiation pneumonitis contributing to his hypoxemia and dyspnea, but I believe this is playing a minor role compared to the volume overload. Given his stage IV adenocarcinoma of the lung more aggressive evaluation for coronary ischemia is imprudent and best managed medically at this point.  1) Acute on chronic combined systolic and diastolic heart failure: We will continue twice-daily IV Lasix at 80 mg. The goal is to completely diuresis the volume. We will continue to follow serial basic metabolic panels to look for a bump in the BUN to suggest we have reached that point. In order to achieve this we will also need to make sure his afterload is appropriately managed. He is currently on carvedilol and we will titrate this upwards as the pulse permits. With improvement in his renal function he may also be a candidate for initiation of ace inhibition. He is now stable for transfer out of the stepdown unit and onto the general medical floor with telemetry.  2) Disposition: He will be discharged home as soon as possible, although we would like to make sure that we completely diurese him to limit the likelihood that he will be readmitted with heart failure.

## 2017-11-19 NOTE — Progress Notes (Signed)
   Subjective: Patient states that he feels better this morning he noticed his legs are less swollen and he is less short of breath.  He continues to deny chest pain and reports no chest pain overnight.    Objective:  Vital signs in last 24 hours: Vitals:   11/19/17 1230 11/19/17 1300 11/19/17 1400 11/19/17 1500  BP: 111/66 118/66 117/76 (!) 106/57  Pulse: 79 79 96 91  Resp: (!) 25 (!) 25 17 17   Temp:      TempSrc:      SpO2: 95% 97% 92% 96%   Physical Exam  Constitutional: He appears well-developed.  Eyes: Right eye exhibits no discharge. Left eye exhibits no discharge. No scleral icterus.  Neck: JVD (13cm H2O) present.  Cardiovascular: Normal rate and intact distal pulses. Exam reveals no gallop and no friction rub.  Murmur (RLSB 2/6) heard. Pulmonary/Chest: Effort normal. No respiratory distress. He has no wheezes. He has rales in the right lower field and the left lower field.  Abdominal: Soft. Bowel sounds are normal. He exhibits no distension and no mass. There is no tenderness. There is no guarding.  Musculoskeletal:       Right lower leg: He exhibits edema.       Left lower leg: He exhibits edema.  Neurological: He is alert.    Assessment/Plan:  Principal Problem:   Congestive heart failure (CHF) (HCC) Active Problems:   Essential hypertension   Hyponatremia   Lung cancer, primary, with metastasis from lung to other site, right (HCC)  HFrEF recent onset EF 35-40%, G2DD.  Orthopnea, LE edema, JVD could be 2/2 chemo and radiation therapy vs ischemic cardiomyopathy  -diuresed well yesterday with symptomatic improvement, off bipap on minimal [place] -continue IV lasix 60mg  BID -started on losartan 25mg , continue low dose carvedilol -asa 81mg    Hypervolemic Hyponatremia In setting of volume overload 2/2 new onset heart failure  -improving, continue diuresis  Metastatic Lung Adenocarcinoma Non small cell with mets to liver, s/p radiation  -NTD at this  time   Dispo: will transfer pt to regular floor, Anticipated discharge in approximately 2-3day(s).   Katherine Roan, MD 11/19/2017, 3:30 PM Vickki Muff MD PGY-1 Internal Medicine Pager # [phone]

## 2017-11-19 NOTE — Plan of Care (Signed)
[address] [place]

## 2017-11-19 NOTE — Progress Notes (Signed)
Progress Note  Patient Name: Harry Hinton Date of Encounter: 11/19/2017  Primary Cardiologist: Reola Calkins [place] Lack)  Subjective   Breathing is better this morning.   Inpatient Medications    Scheduled Meds: . aspirin EC  81 mg Oral QHS  . carvedilol  3.125 mg Oral BID WC  . dronabinol  2.5 mg Oral BID AC  . enoxaparin (LOVENOX) injection  40 mg Subcutaneous Q24H  . furosemide  60 mg Intravenous Q12H  . mouth rinse  15 mL Mouth Rinse BID  . multivitamin with minerals  1 tablet Oral Daily  . potassium chloride  40 mEq Oral Once   Continuous Infusions: . nitroGLYCERIN     PRN Meds: nitroGLYCERIN   Vital Signs    Vitals:   11/19/17 0700 11/19/17 0710 11/19/17 0715 11/19/17 0730  BP: (!) 186/74 131/78 (!) 123/51 (!) 139/102  Pulse: (!) 36 (!) 35 (!) 28 (!) 43  Resp: (!) 28 (!) 27 17 20   Temp:      TempSrc:      SpO2: 96% 96% 96% 95%    Intake/Output Summary (Last 24 hours) at 11/19/2017 0803 Last data filed at 11/19/2017 0600 Gross per 24 hour  Intake -  Output 2950 ml  Net -2950 ml   There were no vitals filed for this visit.  Telemetry    SR with PACs - Personally Reviewed  ECG    SR with PACs, TWI changes have improved - Personally Reviewed  Physical Exam   General: Thin older W male appearing in no acute distress. Head: Normocephalic, atraumatic.  Neck: Supple, + JVD. Lungs:  Resp regular and unlabored, CTA. Heart: RRR, S1, S2, no S3, S4, soft systolic murmur; no rub. Abdomen: Soft, non-tender, non-distended with normoactive bowel sounds.  Extremities: No clubbing, cyanosis, 2+ bilateral pitting edema. Compression stockings in place. Distal pedal pulses are 2+ bilaterally. Neuro: Alert and oriented X 3. Moves all extremities spontaneously. Psych: Normal affect.  Labs    Chemistry Recent Labs  Lab 11/13/17 1145 11/18/17 0804  11/18/17 1926 11/19/17 0203 11/19/17 0631  NA 130 122*   < > 127* 127* 128*  K 4.4 4.3   < > 3.6 4.5 3.3*  CL 93* 82*   <  > 85* 84* 83*  CO2 29 25   < > 28 30 32  GLUCOSE 126* 153*   < > 138* 101* 100*  BUN 20 28*   < > 25* 28* 27*  CREATININE 1.10 1.06   < > 1.05 1.21 1.09  CALCIUM 9.2 9.0   < > 8.0* 8.2* 8.4*  PROT 6.9 6.5  --   --   --   --   ALBUMIN 2.6* 2.7*  --   --   --   --   AST 27 28  --   --   --   --   ALT 28 37  --   --   --   --   ALKPHOS 112* 110  --   --   --   --   BILITOT 0.6 0.6  --   --   --   --   GFRNONAA  --  >60   < > >60 54* >60  GFRAA  --  >60   < > >60 >60 >60  ANIONGAP 8 15   < > 14 13 13    < > = values in this interval not displayed.     Hematology Recent Labs  Lab 11/13/17 1145 11/18/17  0804 11/19/17 0414  WBC 10.3* 15.6* 12.3*  RBC 3.91* 4.32 3.91*  HGB  --  12.8* 11.5*  HCT 34.8* 37.7* 34.3*  MCV 89.0 87.3 87.7  MCH 30.2 29.6 29.4  MCHC 33.9 34.0 33.5  RDW 15.5 15.4 15.9*  PLT 239 292 232    Cardiac Enzymes Recent Labs  Lab 11/18/17 1357 11/18/17 1926 11/19/17 0203  TROPONINI 0.79* 0.61* 0.48*    Recent Labs  Lab 11/18/17 0822  TROPIPOC 0.07     BNP Recent Labs  Lab 11/18/17 0804  BNP 1,669.6*     DDimer No results for input(s): DDIMER in the last 168 hours.    Radiology    Dg Chest Portable 1 View  Result Date: 11/18/2017 CLINICAL DATA:  Shortness of breath. History of left sided lung carcinoma EXAM: PORTABLE CHEST 1 VIEW COMPARISON:  Chest radiograph November 04, 2017 and chest CT November 13, 2017 FINDINGS: There is extensive fibrotic change bilaterally there is a small left pleural effusion. There is apparent radiation therapy change in the left hilar region. There is no frank edema or consolidation. Heart size is within normal limits. Pulmonary vascularity is within normal limits. No adenopathy is demonstrable by radiography. Port-A-Cath tip is at the cavoatrial junction. No pneumothorax. IMPRESSION: Areas of radiation therapy change on the left. Fibrosis bilaterally with small left pleural effusion. No well-defined mass seen by  radiography. No new opacity. Stable cardiac silhouette. Electronically Signed   By: Lowella Grip III M.D.   On: 11/18/2017 08:14    Cardiac Studies   TTE: 11/18/17  Study Conclusions  - Left ventricle: The cavity size was normal. Wall thickness was   normal. Systolic function was moderately reduced. The estimated   ejection fraction was in the range of 35% to 40%. Features are   consistent with a pseudonormal left ventricular filling pattern,   with concomitant abnormal relaxation and increased filling   pressure (grade 2 diastolic dysfunction). - Aortic valve: Mildly to moderately calcified annulus. Moderately   thickened, moderately calcified leaflets. There was mild   regurgitation. - Mitral valve: Mildly calcified annulus. - Right ventricle: The cavity size was mildly dilated. Systolic   function was mildly reduced. - Pulmonary arteries: Systolic pressure was moderately increased.   PA peak pressure: 45 mm Hg (S).  Patient Profile     84 y.o. male with PMH of non-small cell lung CA with metastases to the liver, HTN, chronic diastolic CHF on last echo 03/10/17, and tobacco abuse, who is being seen today for the evaluation of acute on chronic diastolic heart failure.   Assessment & Plan    1. Acute on chronic diastolic CHF: patient presented with worsening SOB and LE edema for several weeks. BNP >1600 and CXR without overt pulmonary edema; however significant fibrotic changes. Has been on IV lasix 60mg  BID with good UOP 2.9L. Still dyspneic at rest. Echo 2/7 showed significant decline in EF to 35% with G2DD.  - continue IV lasix 60mg  BID, coreg 3.125 BID, ASA. Consider adding ARB.   2. Abnormal EKG: TWave changes have improved this morning. Noted in SR with freq PACs. Patient denies any chest pain. Trop peak at 0.79. Did have a significant decline in EF, but no WMA noted on report.   2. Acute respiratory failure: likely multifactorial. Previously without O2 requirements at  home. Has now weaned from [place] to [place]  3. HTN: Off nitro gtt.  - Continue coreg 3.125 BID given decline in EF.  - consider adding  ARB as long as renal function remains stable.  4. Pneumonitis: recent CTA with evidence of post-radiation pneumonitis - s/p IV steroids in the ED - Management per primary team  5. Metastatic lung cancer: non-small cell lung CA with mets to the liver. Plans for additional chemotherapy noted in last Oncology follow-up note.  - Management per primary team  6. Hyponatremia: Na improved to 128 with diuresis - Management per primary team  7. Leukocytosis: WBC 15.6, likely 2/2 recent steroids started 11/13/17. No obvious PNA on CXR.  - Management per primary team.  8. Hypokalemia: K+ 3.3, replete  Signed, Reino Bellis, NP  11/19/2017, 8:03 AM  Pager # [phone]   I have examined the patient and reviewed assessment and plan and discussed with patient.  Agree with above as stated.  COntinue diuresis.  Low albumin.  Low EF.  No angina.  Would treat for nonischemic cardiomyopathy.  WOuld not pursue ischemia w/u given all of his other problems.  Do not want to commit him to dual antiplatelet therapy given his other issues, and anticipated chemotherapy. Will add losartan 25 mg daily.  Larae Grooms   For questions or updates, please contact [facility] Please consult [email or web address] for contact info under Cardiology/STEMI.

## 2017-11-20 ENCOUNTER — Encounter (HOSPITAL_COMMUNITY): Payer: Self-pay | Admitting: *Deleted

## 2017-11-20 ENCOUNTER — Other Ambulatory Visit: Payer: Self-pay

## 2017-11-20 LAB — URINALYSIS, ROUTINE W REFLEX MICROSCOPIC
BILIRUBIN URINE: NEGATIVE
Glucose, UA: NEGATIVE mg/dL
HGB URINE DIPSTICK: NEGATIVE
Ketones, ur: NEGATIVE mg/dL
Leukocytes, UA: NEGATIVE
Nitrite: NEGATIVE
PH: 7 (ref 5.0–8.0)
Protein, ur: NEGATIVE mg/dL
Specific Gravity, Urine: 1.006 (ref 1.005–1.030)

## 2017-11-20 LAB — BASIC METABOLIC PANEL
ANION GAP: 12 (ref 5–15)
BUN: 30 mg/dL — ABNORMAL HIGH (ref 6–20)
CHLORIDE: 85 mmol/L — AB (ref 101–111)
CO2: 29 mmol/L (ref 22–32)
Calcium: 8.1 mg/dL — ABNORMAL LOW (ref 8.9–10.3)
Creatinine, Ser: 1.11 mg/dL (ref 0.61–1.24)
GFR calc Af Amer: >60 mL/min (ref >60)
GFR, EST NON AFRICAN AMERICAN: 60 mL/min — AB (ref >60)
Glucose, Bld: 96 mg/dL (ref 65–99)
POTASSIUM: 4 mmol/L (ref 3.5–5.1)
SODIUM: 126 mmol/L — AB (ref 135–145)

## 2017-11-20 LAB — SODIUM, URINE, RANDOM: Sodium, Ur: 70 mmol/L

## 2017-11-20 LAB — OSMOLALITY: Osmolality: 273 mOsm/kg — ABNORMAL LOW (ref 275–295)

## 2017-11-20 MED ORDER — ALBUTEROL SULFATE (2.5 MG/3ML) 0.083% IN NEBU: 2.5000 mg | INHALATION_SOLUTION | Freq: Four times a day (QID) | RESPIRATORY_TRACT | Status: DC | PRN

## 2017-11-20 MED ORDER — POTASSIUM CHLORIDE CRYS ER 20 MEQ PO TBCR
20.0000 meq | EXTENDED_RELEASE_TABLET | Freq: Once | ORAL | Status: AC
  Administered 2017-11-20: 20 meq via ORAL
  Filled 2017-11-20: qty 1

## 2017-11-20 MED ORDER — ALBUTEROL SULFATE (2.5 MG/3ML) 0.083% IN NEBU
2.5000 mg | INHALATION_SOLUTION | Freq: Once | RESPIRATORY_TRACT | Status: AC
  Administered 2017-11-20: 2.5 mg via RESPIRATORY_TRACT
  Filled 2017-11-20: qty 3

## 2017-11-20 MED ORDER — SODIUM CHLORIDE 0.9% FLUSH
10.0000 mL | INTRAVENOUS | Status: DC | PRN
  Administered 2017-11-20: 20 mL

## 2017-11-20 NOTE — Plan of Care (Signed)
Heart failure education packet given to patient. Pt. Stated he would like [place] through packet when wife is present.

## 2017-11-20 NOTE — Progress Notes (Signed)
Internal Medicine Attending  Date: 11/20/2017  Patient name: Harry Hinton Medical record number: [id number] Date of birth: [birth year] Age: 84 y.o. Gender: male  I saw and evaluated the patient. I reviewed the resident's note by Dr. Shan Levans and I agree with the resident's findings and plans as documented in his progress note.  When seen on rounds this morning Harry Hinton continued to note improvement in his breathing. We had a long discussion about the various causes of his dyspnea and which were most likely playing a role in the symptoms resulting in his admission. We will continue to work on diuresis as long as his renal function allows. This may require that we back off some on the ACE inhibitor at least temporarily. As I believe volume was more of a contributor to his dyspnea than the radiation pneumonitis we are holding off on further prednisone at this time, as the prednisone may have unmasked the left ventricular dysfunction. We have been able to wean the oxygen down to 2 L and will continue to wean as tolerated (target O2 sat 90-92%). We will try to further sort out the cause or causes of the hyponatremia so we may better address the issue.  I anticipate in the next 24-48 hours we may have reached the limits of intravenous diuresis at which point he may be stable for discharge home.

## 2017-11-20 NOTE — Progress Notes (Signed)
PT Cancellation Note  Patient Details Name: Jaivyn Gulla MRN: [id number] DOB: [birth year]   Cancelled Treatment:    Reason Eval/Treat Not Completed: Patient not medically ready. Per RN, pt desaturated with mobility, requiring non-rebreather mask to assist recovery. Request hold off on PT evaluation today. Will follow-up tomorrow.   Mabeline Caras, PT, DPT Acute Rehab Services  Pager: [place] 11/20/2017, 3:11 PM

## 2017-11-20 NOTE — Progress Notes (Signed)
Patient was ambulated in room and 02 sats checked.  92 sat 91% on 2L nasal cannula at rest and dropped to 86% room air at rest.  When he ambulated on room air, his 02 sat dropped to 74%.  Walked approximately 30 feet and too short of breath to walk further.  Assisted back to bed and 02 replaced at 6 Liters via nasal cannula - 02 sat remained in the 70s.  Patient placed on non-rebreather mask and 02 sat increased to 95% after 3 minutes.  Respirations 28/minute and patient using accessory muscles.  Mask left on patient for 5 minutes until respirations decreased to 20/minute and not as labored.  02 sat via nasal cannula at 3 L = 86% and 92% on 4L.  Patient kept on 4L nasal cannula.  Patient, his wife, and his son updated on plan of care.

## 2017-11-20 NOTE — Progress Notes (Signed)
Progress Note  Patient Name: Harry Hinton Date of Encounter: 11/20/2017  Primary Cardiologist: No primary care provider on file.   Subjective   Breathing improved  Inpatient Medications    Scheduled Meds: . aspirin EC  81 mg Oral QHS  . carvedilol  3.125 mg Oral BID WC  . dronabinol  2.5 mg Oral BID AC  . enoxaparin (LOVENOX) injection  40 mg Subcutaneous Q24H  . furosemide  60 mg Intravenous Q12H  . losartan  25 mg Oral Daily  . mouth rinse  15 mL Mouth Rinse BID  . multivitamin with minerals  1 tablet Oral Daily   Continuous Infusions:  PRN Meds: albuterol, sodium chloride flush   Vital Signs    Vitals:   11/20/17 1415 11/20/17 1803 11/20/17 1852 11/20/17 2057  BP:  (!) 114/58  99/63  Pulse:  (!) 47 60 85  Resp:  18  18  Temp:  97.8 F (36.6 C)  98.1 F (36.7 C)  TempSrc:  Oral Oral Oral  SpO2: 95% 94%  100%  Weight:   170 lb 13.7 oz (77.5 kg)   Height:   6' (1.829 m)     Intake/Output Summary (Last 24 hours) at 11/20/2017 2341 Last data filed at 11/20/2017 1228 Gross per 24 hour  Intake 180 ml  Output 1725 ml  Net -1545 ml   Filed Weights   11/19/17 2218 11/20/17 0640 11/20/17 1852  Weight: 173 lb 8 oz (78.7 kg) 170 lb 13.7 oz (77.5 kg) 170 lb 13.7 oz (77.5 kg)    Telemetry    NSR - Personally Reviewed  ECG      Physical Exam   GEN: No acute distress.   Neck: No JVD Cardiac: RRR, no murmurs, rubs, or gallops.  Respiratory: Clear to auscultation bilaterally. GI: Soft, nontender, non-distended  MS: Decreased edema; No deformity. Neuro:  Nonfocal  Psych: Normal affect   Labs    Chemistry Recent Labs  Lab 11/18/17 0804  11/19/17 0203 11/19/17 0631 11/20/17 0525  NA 122*   < > 127* 128* 126*  K 4.3   < > 4.5 3.3* 4.0  CL 82*   < > 84* 83* 85*  CO2 25   < > 30 32 29  GLUCOSE 153*   < > 101* 100* 96  BUN 28*   < > 28* 27* 30*  CREATININE 1.06   < > 1.21 1.09 1.11  CALCIUM 9.0   < > 8.2* 8.4* 8.1*  PROT 6.5  --   --   --   --     ALBUMIN 2.7*  --   --   --   --   AST 28  --   --   --   --   ALT 37  --   --   --   --   ALKPHOS 110  --   --   --   --   BILITOT 0.6  --   --   --   --   GFRNONAA >60   < > 54* >60 60*  GFRAA >60   < > >60 >60 >60  ANIONGAP 15   < > 13 13 12    < > = values in this interval not displayed.     Hematology Recent Labs  Lab 11/18/17 0804 11/19/17 0414  WBC 15.6* 12.3*  RBC 4.32 3.91*  HGB 12.8* 11.5*  HCT 37.7* 34.3*  MCV 87.3 87.7  MCH 29.6 29.4  MCHC 34.0 33.5  RDW 15.4 15.9*  PLT 292 232    Cardiac Enzymes Recent Labs  Lab 11/18/17 1357 11/18/17 1926 11/19/17 0203  TROPONINI 0.79* 0.61* 0.48*    Recent Labs  Lab 11/18/17 0822  TROPIPOC 0.07     BNP Recent Labs  Lab 11/18/17 0804  BNP 1,669.6*     DDimer No results for input(s): DDIMER in the last 168 hours.   Radiology    No results found.  Cardiac Studies   Decreased EF by echo  Patient Profile     84 y.o. male with acute systolic heart failure, lung CA with mets,   Assessment & Plan    1) Continue diuresis as tolerated .  Renal function has been stable.  Volume overload likely secondary to low albumin and decreased LV function.   HTN: BP controlled.   No angina.  No plan for ischemia w/u ggiven significant comrbisities including metastatic lung CA.  For questions or updates, please contact [place] Please consult [email or web address] for contact info under Cardiology/STEMI.      Signed, Larae Grooms, MD  11/20/2017, 11:41 PM

## 2017-11-20 NOTE — Plan of Care (Signed)
  Progressing Clinical Measurements: Ability to maintain clinical measurements within normal limits will improve 11/20/2017 1022 - Progressing by Alonna Buckler, RN Diagnostic test results will improve 11/20/2017 1022 - Progressing by Alonna Buckler, RN Respiratory complications will improve 11/20/2017 1022 - Progressing by Alonna Buckler, RN Cardiovascular complication will be avoided 11/20/2017 1022 - Progressing by Alonna Buckler, RN Coping: Level of anxiety will decrease 11/20/2017 1022 - Progressing by Alonna Buckler, RN Activity: Capacity to carry out activities will improve 11/20/2017 1022 - Progressing by Alonna Buckler, RN Cardiac: Ability to achieve and maintain adequate cardiopulmonary perfusion will improve 11/20/2017 1022 - Progressing by Alonna Buckler, RN

## 2017-11-20 NOTE — Progress Notes (Addendum)
   Subjective: Patient continues to notice improvement in his breathing.  He continues to deny chest pain.  We discussed in length about his current clinical scenario that he is getting closer to euvolemia.    Objective:  Vital signs in last 24 hours: Vitals:   11/19/17 2218 11/20/17 0640 11/20/17 0850 11/20/17 0952  BP:  134/60  91/62  Pulse:  89  98  Resp:      Temp:  98.1 F (36.7 C)    TempSrc:  Oral    SpO2:  95% 92% 90%  Weight: 173 lb 8 oz (78.7 kg) 170 lb 13.7 oz (77.5 kg)     Physical Exam  Constitutional: He appears well-developed.  Eyes: Right eye exhibits no discharge. Left eye exhibits no discharge. No scleral icterus.  Cardiovascular: Normal rate and intact distal pulses. Exam reveals no gallop and no friction rub.  Murmur (RLSB 2/6) heard. Pulmonary/Chest: Effort normal. No respiratory distress. He has wheezes (faint expiratory wheeze heard ) in the right middle field, the right lower field, the left middle field and the left lower field. He has rales in the right lower field and the left lower field.  Abdominal: Soft. Bowel sounds are normal. He exhibits no distension and no mass. There is no tenderness. There is no guarding.  Musculoskeletal:       Right lower leg: He exhibits edema (1+).       Left lower leg: He exhibits edema (1+).  Neurological: He is alert.   BMP Latest Ref Rng & Units 11/20/2017 11/19/2017 11/19/2017  Glucose 65 - 99 mg/dL 96 100(H) 101(H)  BUN 6 - 20 mg/dL 30(H) 27(H) 28(H)  Creatinine 0.61 - 1.24 mg/dL 1.11 1.09 1.21  Sodium 135 - 145 mmol/L 126(L) 128(L) 127(L)  Potassium 3.5 - 5.1 mmol/L 4.0 3.3(L) 4.5  Chloride 101 - 111 mmol/L 85(L) 83(L) 84(L)  CO2 22 - 32 mmol/L 29 32 30  Calcium 8.9 - 10.3 mg/dL 8.1(L) 8.4(L) 8.2(L)   Assessment/Plan:  Principal Problem:   Congestive heart failure (CHF) (HCC) Active Problems:   Essential hypertension   Hyponatremia   Lung cancer, primary, with metastasis from lung to other site, right (HCC)  Acute on chronic combined systolic and diastolic heart failure ([place])   Primary lung cancer with metastasis from lung to other site, left (HCC)  HFrEF recent onset EF 35-40%, G2DD.  Orthopnea, LE edema, JVD could be 2/2 chemo and radiation therapy vs ischemic cardiomyopathy  -continues slow diuresis with symptomatic improvement, improvement seen on physical exam -will continue to wean [place] oxygen -continue IV lasix 60mg  BID -started on losartan 25mg , blood pressure tends to be lower than I would like with losartan, will consider backing off on it for the time being, also possibly continue low dose carvedilol -asa 81mg     Hypervolemic Hyponatremia In setting of volume overload 2/2 new onset heart failure  -improved initially but trended down today, continue diuresis -will get urine studies to help see if pt is concentrating urine, and what ADH's role is at this time -will consider backing off on ARB and adding acetazolamide instead to preserve aldosterone  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   Katherine Roan, MD 11/20/2017, 11:11 AM Vickki Muff MD PGY-1 Internal Medicine Pager # [phone]

## 2017-11-21 DIAGNOSIS — C3490 Malignant neoplasm of unspecified part of unspecified bronchus or lung: Secondary | ICD-10-CM

## 2017-11-21 DIAGNOSIS — Z79899 Other long term (current) drug therapy: Secondary | ICD-10-CM

## 2017-11-21 DIAGNOSIS — Z7982 Long term (current) use of aspirin: Secondary | ICD-10-CM

## 2017-11-21 DIAGNOSIS — E222 Syndrome of inappropriate secretion of antidiuretic hormone: Secondary | ICD-10-CM

## 2017-11-21 DIAGNOSIS — Z923 Personal history of irradiation: Secondary | ICD-10-CM

## 2017-11-21 DIAGNOSIS — Z9221 Personal history of antineoplastic chemotherapy: Secondary | ICD-10-CM

## 2017-11-21 LAB — BASIC METABOLIC PANEL
Anion gap: 12 (ref 5–15)
BUN: 44 mg/dL — ABNORMAL HIGH (ref 6–20)
CALCIUM: 8 mg/dL — AB (ref 8.9–10.3)
CHLORIDE: 84 mmol/L — AB (ref 101–111)
CO2: 31 mmol/L (ref 22–32)
Creatinine, Ser: 1.43 mg/dL — ABNORMAL HIGH (ref 0.61–1.24)
GFR calc non Af Amer: 44 mL/min — ABNORMAL LOW (ref >60)
GFR, EST AFRICAN AMERICAN: 51 mL/min — AB (ref >60)
Glucose, Bld: 104 mg/dL — ABNORMAL HIGH (ref 65–99)
Potassium: 4.1 mmol/L (ref 3.5–5.1)
Sodium: 127 mmol/L — ABNORMAL LOW (ref 135–145)

## 2017-11-21 LAB — OSMOLALITY, URINE: Osmolality, Ur: 290 mOsm/kg — ABNORMAL LOW (ref 300–900)

## 2017-11-21 LAB — UREA NITROGEN, URINE: Urea Nitrogen, Ur: 271 mg/dL

## 2017-11-21 MED ORDER — FUROSEMIDE 40 MG PO TABS
40.0000 mg | ORAL_TABLET | Freq: Two times a day (BID) | ORAL | Status: DC
  Administered 2017-11-21 – 2017-11-22 (×3): 40 mg via ORAL
  Filled 2017-11-21 (×2): qty 1

## 2017-11-21 NOTE — Progress Notes (Signed)
Medicine attending: I examined this patient today together with resident physician Dr. Lorella Nimrod and I concur with her evaluation and management plan which we discussed together. We appreciate ongoing input from cardiology. Elderly man admitted on February 6 with new onset systolic heart failure.  Medical history most remarkable for metastatic adenocarcinoma of the lung with prior radiation treatment completed in August 2018.  Recent chemotherapy for recurrence with carboplatinum and etoposide.  I doubt that the radiation treatment or the chemotherapy that he received is responsible for his cardiomyopathy.  With new computerized radiation techniques, there should be minimal scatter of radiation to the heart.  In addition, radiation toxicity typically affects the coronary arteries and/or cardiac valves with a delay of many years after treatment. He is improving with parenteral diuresis.  We have now reached his likely dry weight with sudden rise in his BUN and creatinine. On exam, no JVD.  Mildly dyspneic at rest.  Oxygenation fell when he ambulated without oxygen yesterday.  Decreased breath sounds and dull to percussion at the right base.  Regular cardiac rhythm.  No murmur.  Persistent 1+ pedal and ankle edema.  Improved compared with previous exams per Dr. Reesa Chew. Agree with cardiology, likely underlying coronary artery disease, in fact, on recent CT scan of the chest, all of his coronary arteries are outlined with calcium. He will likely need home oxygen.  We will transition to oral diuretics today. Wife and daughter present.  Status and management plan reviewed.

## 2017-11-21 NOTE — Evaluation (Signed)
Occupational Therapy Evaluation Patient Details Name: Harry Hinton MRN: [id number] DOB: [birth year] Today's Date: 11/21/2017    History of Present Illness 84 y.o. male with acute systolic heart failure, lung CA with mets. Admitted for Acute on chronic combined systolic and diastolic heart failure.   Clinical Impression   Pt walked with a cane and was independent in self care at baseline. He presents with decreased activity tolerance dyspnea with minimal exertion. Pt demonstrates decreased standing balance, improved safety with use of RW. Pt requires min assist to supervision for ADL. Began education in energy conservation strategies. Will follow acutely.    Follow Up Recommendations  No OT follow up    Equipment Recommendations  Rollator    Recommendations for Other Services       Precautions / Restrictions Precautions Precautions: Fall Restrictions Weight Bearing Restrictions: No      Mobility Bed Mobility Overal bed mobility: Modified Independent             General bed mobility comments: extra time, HOB up  Transfers Overall transfer level: Needs assistance Equipment used: Rolling walker (2 wheeled) Transfers: Sit to/from Stand Sit to Stand: Supervision         General transfer comment: increased time and effort, supervision for safety    Balance Overall balance assessment: Needs assistance Sitting-balance support: No upper extremity supported;Feet supported Sitting balance-Leahy Scale: Good       Standing balance-Leahy Scale: Fair                             ADL either performed or assessed with clinical judgement   ADL Overall ADL's : Needs assistance/impaired Eating/Feeding: Independent;Bed level   Grooming: Wash/dry hands;Standing;Supervision/safety   Upper Body Bathing: Sitting;Set up   Lower Body Bathing: Moderate assistance;Sit to/from stand   Upper Body Dressing : Sitting;Set up   Lower Body Dressing: Moderate  assistance;Sit to/from stand   Toilet Transfer: Ambulation;RW;Min guard   Toileting- Water quality scientist and Hygiene: Sit to/from stand;Supervision/safety       Functional mobility during ADLs: Rolling walker;Min guard General ADL Comments: Pt and family educated in energy conservation strategies and benefits of rollator.     Vision Baseline Vision/History: Wears glasses Wears Glasses: At all times Patient Visual Report: No change from baseline       Perception     Praxis      Pertinent Vitals/Pain Pain Assessment: No/denies pain     Hand Dominance Left   Extremity/Trunk Assessment Upper Extremity Assessment Upper Extremity Assessment: Overall WFL for tasks assessed   Lower Extremity Assessment Lower Extremity Assessment: Defer to PT evaluation       Communication Communication Communication: No difficulties   Cognition Arousal/Alertness: Awake/alert Behavior During Therapy: WFL for tasks assessed/performed Overall Cognitive Status: Within Functional Limits for tasks assessed                                     General Comments       Exercises     Shoulder Instructions      Home Living Family/patient expects to be discharged to:: Private residence Living Arrangements: Spouse/significant other Available Help at Discharge: Family;Available 24 hours/day Type of Home: House Home Access: Stairs to enter [employer] of Steps: 1 Entrance Stairs-Rails: None Home Layout: One level     Bathroom Shower/Tub: Tub/shower unit;Walk-in shower   Bathroom Toilet: Standard  Home Equipment: Shower seat;Cane - single point          Prior Functioning/Environment Level of Independence: Independent with assistive device(s)        Comments: sat to shower        OT Problem List: Decreased knowledge of use of DME or AE;Cardiopulmonary status limiting activity;Increased edema;Impaired balance (sitting and/or standing)      OT  Treatment/Interventions: Self-care/ADL training;DME and/or AE instruction;Patient/family education;Balance training;Therapeutic activities;Energy conservation    OT Goals(Current goals can be found in the care plan section) Acute Rehab OT Goals Patient Stated Goal: go home when he feels better OT Goal Formulation: With patient Time For Goal Achievement: 12/05/17 Potential to Achieve Goals: Good ADL Goals Pt Will Perform Lower Body Bathing: with modified independence;sit to/from stand Pt Will Perform Lower Body Dressing: with modified independence;sit to/from stand Pt Will Transfer to Toilet: with modified independence;ambulating Pt Will Perform Tub/Shower Transfer: Shower transfer;with supervision;ambulating;shower seat;rolling walker Additional ADL Goal #1: Pt will state at least 3 energy conservation strategies as instructed.  OT Frequency: Min 2X/week   Barriers to D/C:            Co-evaluation              AM-PAC PT "6 Clicks" Daily Activity     Outcome Measure Help from another person eating meals?: None Help from another person taking care of personal grooming?: A Little Help from another person toileting, which includes using toliet, bedpan, or urinal?: A Little Help from another person bathing (including washing, rinsing, drying)?: A Lot Help from another person to put on and taking off regular upper body clothing?: None Help from another person to put on and taking off regular lower body clothing?: A Lot 6 Click Score: 18   End of Session Equipment Utilized During Treatment: Gait belt;Rolling walker;Oxygen  Activity Tolerance: Patient limited by fatigue Patient left: in bed;with call bell/phone within reach;with family/visitor present  OT Visit Diagnosis: Unsteadiness on feet (R26.81);Other abnormalities of gait and mobility (R26.89);Other (comment)(decreased activity tolerance)                Time: 9201-0071 OT Time Calculation (min): 17 min Charges:  OT  General Charges $OT Visit: 1 Visit OT Evaluation $OT Eval Moderate Complexity: 1 Mod G-Codes:     December 13, 2017 Nestor Lewandowsky, OTR/L Pager: [phone]  Werner Lean, Haze Boyden 12/25/2017, 2:53 PM

## 2017-11-21 NOTE — Plan of Care (Signed)
  Coping: Level of anxiety will decrease 11/21/2017 0431 - Completed/Met by Evert Kohl, RN   Safety: Ability to remain free from injury will improve 11/21/2017 0431 - Completed/Met by Garnett[place]er, Sophronia Simas, RN

## 2017-11-21 NOTE — Progress Notes (Signed)
   Subjective: Patient was feeling better when seen this morning, he did desaturate to 74% with ambulation yesterday requiring nonrebreather for few minutes.  Currently saturating in mid 90s on 4 L.  He continued to experience dyspnea with mild exertion, he was feeling okay at rest.  Objective:  Vital signs in last 24 hours: Vitals:   11/20/17 1803 11/20/17 1852 11/20/17 2057 11/21/17 0445  BP: (!) 114/58  99/63 117/65  Pulse: (!) 47 60 85 (!) 45  Resp: 18  18 18   Temp: 97.8 F (36.6 C)  98.1 F (36.7 C) 97.7 F (36.5 C)  TempSrc: Oral Oral Oral Oral  SpO2: 94%  100% 95%  Weight:  170 lb 13.7 oz (77.5 kg)  166 lb 10.7 oz (75.6 kg)  Height:  6' (1.829 m)     Gen. well-developed elderly man, appears comfortable in his bed, in no acute distress. Lungs.  Left basal crackles with decreased breath sounds from mid to lower left zone. CVS.  Normal rate and rhythm. Abdomen.  Soft, nontender, bowel sounds positive. Extremities.  1+ edema up to below the knees bilaterally.  Assessment/Plan:  84 year old gentleman with history of stage IV lung cancer, admitted for new onset heart failure.  HFrEF.  His symptoms continue to improve, requiring 3-4 L of oxygen to maintain his saturation.  BMP with a increase in BUN and creatinine.44/1.43 He has a weight decrease of 4 pounds with a net output of more than 5 L. Blood pressure remained on softer side. -Discontinue IV Lasix. -Start him on p.o. Lasix 40 mg twice daily. -Continue monitoring daily BMP. -Continue low-dose carvedilol and aspirin.  Hypervolemic, hypotonic hyponatremia. 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  Urine studies were done after he was on Lasix.  Sodium remains stable at 127 today. -Continue to monitor.  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 -Patient will continue management according her  oncologist.  Dispo: Anticipated discharge in approximately 1-2 day(s).   Lorella Nimrod, MD 11/21/2017, 9:06 AM Pager: [phone]

## 2017-11-21 NOTE — Evaluation (Signed)
Physical Therapy Evaluation Patient Details Name: Harry Hinton MRN: [id number] DOB: 09/16/1933 Today's Date: 11/21/2017   History of Present Illness  84 y.o. male with acute systolic heart failure, lung CA with mets. Admitted for Acute on chronic combined systolic and diastolic heart failure.  Clinical Impression  Pt admitted with above diagnosis. Pt currently with functional limitations due to the deficits listed below (see PT Problem List). Sats 94% at rest on 2L supplemental O2. Required 4L while ambulating to maintain 88%. Moderate dyspnea with gait, using a rolling walker for light support. No significant instability when using assistive device. General deconditioning, as he has been relatively inactive for 3 weeks, but prior to this was a community ambulator apparently. Wife present in room and supportive. May benefit from HHPT follow-up for strength and conditioning with home safety evaluation. Pt will benefit from skilled PT to increase their independence and safety with mobility to allow discharge to the venue listed below.       Follow Up Recommendations Home health PT;Supervision for mobility/OOB    Equipment Recommendations  Rolling walker with 5" wheels    Recommendations for Other Services       Precautions / Restrictions Precautions Precautions: Fall Restrictions Weight Bearing Restrictions: No      Mobility  Bed Mobility Overal bed mobility: Modified Independent             General bed mobility comments: extra time  Transfers Overall transfer level: Needs assistance Equipment used: Rolling walker (2 wheeled) Transfers: Sit to/from Stand Sit to Stand: Supervision         General transfer comment: supervision for safety. Effortful for patient. Required UE support on RW upon rising, vc for safety and technique.  Ambulation/Gait Ambulation/Gait assistance: Min guard Ambulation Distance (Feet): 70 Feet Assistive device: Rolling walker (2 wheeled) Gait  Pattern/deviations: Step-through pattern;Decreased stride length;Trunk flexed Gait velocity: decreased Gait velocity interpretation: <1.8 ft/sec, indicative of risk for recurrent falls General Gait Details: Educated on safe DME use with a rolling walker. Moderate dyspnea with SpO2 88% on 4L supplemental O2. Cues for pursed lip breathing and energy conservation techniques. Required 1 standing rest break to complete distance. No LOB with walker for support.  Stairs            Wheelchair Mobility    Modified Rankin (Stroke Patients Only)       Balance Overall balance assessment: Needs assistance Sitting-balance support: No upper extremity supported;Feet supported Sitting balance-Leahy Scale: Good     Standing balance support: No upper extremity supported Standing balance-Leahy Scale: Fair                               Pertinent Vitals/Pain Pain Assessment: No/denies pain    Home Living Family/patient expects to be discharged to:: Private residence Living Arrangements: Spouse/significant other Available Help at Discharge: Family;Available 24 hours/day Type of Home: House Home Access: Stairs to enter Entrance Stairs-Rails: None Entrance Stairs-Number of Steps: 1 Home Layout: One level Home Equipment: Marine scientist - single point      Prior Function Level of Independence: Independent               Hand Dominance   Dominant Hand: Left    Extremity/Trunk Assessment   Upper Extremity Assessment Upper Extremity Assessment: Defer to OT evaluation    Lower Extremity Assessment Lower Extremity Assessment: Generalized weakness       Communication   Communication: No difficulties  Cognition  Arousal/Alertness: Awake/alert Behavior During Therapy: WFL for tasks assessed/performed Overall Cognitive Status: Within Functional Limits for tasks assessed                                        General Comments General comments (skin  integrity, edema, etc.): SpO2 at rest 94% on 2L supplemental O2, down to 84% while ambulating at 2L, and back to 88-90 with 4L. Stood for approx 3 min while performing pericare with assistance.     Exercises General Exercises - Lower Extremity Ankle Circles/Pumps: AROM;Both;10 reps;Supine Gluteal Sets: Strengthening;Both;5 reps;Supine Long Arc Quad: Strengthening;Both;10 reps;Seated Straight Leg Raises: Strengthening;Both;5 reps;Seated   Assessment/Plan    PT Assessment Patient needs continued PT services  PT Problem List Decreased strength;Decreased activity tolerance;Decreased balance;Decreased mobility;Cardiopulmonary status limiting activity       PT Treatment Interventions DME instruction;Gait training;Functional mobility training;Therapeutic activities;Therapeutic exercise;Balance training;Patient/family education;Stair training    PT Goals (Current goals can be found in the Care Plan section)  Acute Rehab PT Goals Patient Stated Goal: go home when he feels better PT Goal Formulation: With patient Time For Goal Achievement: 12/05/17 Potential to Achieve Goals: Good    Frequency Min 3X/week   Barriers to discharge        Co-evaluation               AM-PAC PT "6 Clicks" Daily Activity  Outcome Measure Difficulty turning over in bed (including adjusting bedclothes, sheets and blankets)?: None Difficulty moving from lying on back to sitting on the side of the bed? : None Difficulty sitting down on and standing up from a chair with arms (e.g., wheelchair, bedside commode, etc,.)?: A Little Help needed moving to and from a bed to chair (including a wheelchair)?: None Help needed walking in hospital room?: None Help needed climbing 3-5 steps with a railing? : A Little 6 Click Score: 22    End of Session Equipment Utilized During Treatment: Gait belt;Oxygen Activity Tolerance: Patient tolerated treatment well Patient left: in bed;with call bell/phone within  reach;with bed alarm set;with nursing/sitter in room Nurse Communication: Mobility status(sats) PT Visit Diagnosis: Muscle weakness (generalized) (M62.81);Difficulty in walking, not elsewhere classified (R26.2)    Time: 1962-2297 PT Time Calculation (min) (ACUTE ONLY): 28 min   Charges:   PT Evaluation $PT Eval Moderate Complexity: 1 Mod PT Treatments $Gait Training: 8-22 mins   PT G Codes:        [employer], PT   Ellouise Newer 11/21/2017, 9:40 AM

## 2017-11-21 NOTE — Progress Notes (Signed)
Progress Note  Patient Name: Harry Hinton Date of Encounter: 11/21/2017  Primary Cardiologist: No primary care provider on file.   Subjective   Dyspnea has improved, but he still develops shortness of breath and desaturation with light physical activity.  Still requires oxygen. Weight is down 7 pounds since admission, although intake/output suggests net diuresis 5 L since admission Significant increase in creatinine on today's labs. Switched to p.o. diuretic starting today.  Inpatient Medications    Scheduled Meds: . aspirin EC  81 mg Oral QHS  . carvedilol  3.125 mg Oral BID WC  . dronabinol  2.5 mg Oral BID AC  . enoxaparin (LOVENOX) injection  40 mg Subcutaneous Q24H  . furosemide  40 mg Oral BID  . losartan  25 mg Oral Daily  . mouth rinse  15 mL Mouth Rinse BID  . multivitamin with minerals  1 tablet Oral Daily   Continuous Infusions:  PRN Meds: albuterol, sodium chloride flush   Vital Signs    Vitals:   11/20/17 1852 11/20/17 2057 11/21/17 0445 11/21/17 0955  BP:  99/63 117/65 (!) 101/56  Pulse: 60 85 (!) 45 82  Resp:  18 18   Temp:  98.1 F (36.7 C) 97.7 F (36.5 C)   TempSrc: Oral Oral Oral   SpO2:  100% 95%   Weight: 170 lb 13.7 oz (77.5 kg)  166 lb 10.7 oz (75.6 kg)   Height: 6' (1.829 m)       Intake/Output Summary (Last 24 hours) at 11/21/2017 1032 Last data filed at 11/21/2017 0900 Gross per 24 hour  Intake 600 ml  Output 551 ml  Net 49 ml   Filed Weights   11/20/17 0640 11/20/17 1852 11/21/17 0445  Weight: 170 lb 13.7 oz (77.5 kg) 170 lb 13.7 oz (77.5 kg) 166 lb 10.7 oz (75.6 kg)    Telemetry    NSR - Personally Reviewed  ECG    No new tracing - Personally Reviewed  Physical Exam  Sleepy, looks comfortable. Oriented when awakened GEN: No acute distress.   Neck: No JVD Cardiac: RRR, 2-3/6 early peaking systolic ejection murmur throughout precordium, no diastolic murmurs, rubs, or gallops.  Respiratory: Clear to auscultation  bilaterally. GI: Soft, nontender, non-distended  MS: 2+ edema almost to the knees, symmetrical; No deformity. Neuro:  Nonfocal  Psych: Normal affect   Labs    Chemistry Recent Labs  Lab 11/18/17 0804  11/19/17 0631 11/20/17 0525 11/21/17 0436  NA 122*   < > 128* 126* 127*  K 4.3   < > 3.3* 4.0 4.1  CL 82*   < > 83* 85* 84*  CO2 25   < > 32 29 31  GLUCOSE 153*   < > 100* 96 104*  BUN 28*   < > 27* 30* 44*  CREATININE 1.06   < > 1.09 1.11 1.43*  CALCIUM 9.0   < > 8.4* 8.1* 8.0*  PROT 6.5  --   --   --   --   ALBUMIN 2.7*  --   --   --   --   AST 28  --   --   --   --   ALT 37  --   --   --   --   ALKPHOS 110  --   --   --   --   BILITOT 0.6  --   --   --   --   GFRNONAA >60   < > >  60 60* 44*  GFRAA >60   < > >60 >60 51*  ANIONGAP 15   < > 13 12 12    < > = values in this interval not displayed.     Hematology Recent Labs  Lab 11/18/17 0804 11/19/17 0414  WBC 15.6* 12.3*  RBC 4.32 3.91*  HGB 12.8* 11.5*  HCT 37.7* 34.3*  MCV 87.3 87.7  MCH 29.6 29.4  MCHC 34.0 33.5  RDW 15.4 15.9*  PLT 292 232    Cardiac Enzymes Recent Labs  Lab 11/18/17 1357 11/18/17 1926 11/19/17 0203  TROPONINI 0.79* 0.61* 0.48*    Recent Labs  Lab 11/18/17 0822  TROPIPOC 0.07     BNP Recent Labs  Lab 11/18/17 0804  BNP 1,669.6*     DDimer No results for input(s): DDIMER in the last 168 hours.   Radiology    No results found.  Cardiac Studies   Echo 11/18/2017 - Left ventricle: The cavity size was normal. Wall thickness was   normal. Systolic function was moderately reduced. The estimated   ejection fraction was in the range of 35% to 40%. Features are   consistent with a pseudonormal left ventricular filling pattern,   with concomitant abnormal relaxation and increased filling   pressure (grade 2 diastolic dysfunction). - Aortic valve: Mildly to moderately calcified annulus. Moderately   thickened, moderately calcified leaflets. There was mild   regurgitation. -  Mitral valve: Mildly calcified annulus. - Right ventricle: The cavity size was mildly dilated. Systolic   function was mildly reduced. - Pulmonary arteries: Systolic pressure was moderately increased.   PA peak pressure: 45 mm Hg (S).  Patient Profile     84 y.o. male with newly diagnosed systolic heart failure and metastatic lung cancer.  Assessment & Plan    1. CHF: etiology uncertain, CAD probable, but workup deferred due to comorbid conditions. Still has some signs of hypervolemia, but does not have pulmonary edema and diuresis should be slowed down to avoid renal dysfunction. BP is low, not much room for additional HF therapy. 2. Hyponatremia: largely SIADH, contribution of HF uncertain. 3. 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       For questions or updates, please contact [place] Please consult [email or web address] for contact info under Cardiology/STEMI.      Signed, Sanda Klein, MD  11/21/2017, 10:32 AM

## 2017-11-21 NOTE — Plan of Care (Signed)
Acute physical therapy goals established for safe d/c to home.

## 2017-11-22 ENCOUNTER — Encounter (HOSPITAL_COMMUNITY): Payer: Self-pay

## 2017-11-22 DIAGNOSIS — J438 Other emphysema: Secondary | ICD-10-CM

## 2017-11-22 DIAGNOSIS — C7802 Secondary malignant neoplasm of left lung: Secondary | ICD-10-CM

## 2017-11-22 DIAGNOSIS — Z883 Allergy status to other anti-infective agents status: Secondary | ICD-10-CM

## 2017-11-22 DIAGNOSIS — J439 Emphysema, unspecified: Secondary | ICD-10-CM

## 2017-11-22 DIAGNOSIS — C7801 Secondary malignant neoplasm of right lung: Secondary | ICD-10-CM

## 2017-11-22 LAB — BASIC METABOLIC PANEL
ANION GAP: 12 (ref 5–15)
BUN: 42 mg/dL — AB (ref 6–20)
CHLORIDE: 83 mmol/L — AB (ref 101–111)
CO2: 31 mmol/L (ref 22–32)
Calcium: 7.8 mg/dL — ABNORMAL LOW (ref 8.9–10.3)
Creatinine, Ser: 1.43 mg/dL — ABNORMAL HIGH (ref 0.61–1.24)
GFR calc Af Amer: 51 mL/min — ABNORMAL LOW (ref >60)
GFR, EST NON AFRICAN AMERICAN: 44 mL/min — AB (ref >60)
GLUCOSE: 106 mg/dL — AB (ref 65–99)
POTASSIUM: 3.9 mmol/L (ref 3.5–5.1)
Sodium: 126 mmol/L — ABNORMAL LOW (ref 135–145)

## 2017-11-22 MED ORDER — FUROSEMIDE 40 MG PO TABS: 40.0000 mg | ORAL_TABLET | Freq: Two times a day (BID) | ORAL | 0 refills | Status: DC

## 2017-11-22 MED ORDER — IPRATROPIUM-ALBUTEROL 0.5-2.5 (3) MG/3ML IN SOLN
3.0000 mL | Freq: Once | RESPIRATORY_TRACT | Status: AC
  Administered 2017-11-22: 3 mL via RESPIRATORY_TRACT
  Filled 2017-11-22: qty 3

## 2017-11-22 MED ORDER — ALBUTEROL SULFATE (2.5 MG/3ML) 0.083% IN NEBU: 2.5000 mg | INHALATION_SOLUTION | Freq: Four times a day (QID) | RESPIRATORY_TRACT | 12 refills | Status: AC | PRN

## 2017-11-22 MED ORDER — ALBUTEROL SULFATE HFA 108 (90 BASE) MCG/ACT IN AERS: 2.0000 | INHALATION_SPRAY | Freq: Four times a day (QID) | RESPIRATORY_TRACT | 2 refills | Status: AC | PRN

## 2017-11-22 NOTE — Care Management Note (Signed)
Case Management Note Marvetta Gibbons RN, BSN Unit 4E-Case Manager-- 3E weekend coverage [phone]  Patient Details  Name: Harry Hinton MRN: [id number] Date of Birth: 1933/01/29  Subjective/Objective:   Pt admitted with CHF                 Action/Plan: PTA pt lived at home with wife- referral for [facility] and DME needs including home 66- spoke with MD- for order needs- discussed transition needs with pt and wife at the bedside- choice offered for [facility] agency- per pt and wife- ok with using [facility] for DME and HH needs- discussed DME needs- pt would like rollator for home, and also discussed nebulizer with MD. Pt has also qualified  for home 02. Orders have been placed for HHRN/PT- call placed to [facility] with [facility] for [facility] and DME needs- portable 02 tank, nebulizer and rollator will be delivered to room prior to discharge.   Expected Discharge Date:  11/22/17               Expected Discharge Plan:  [name]  In-House Referral:  NA  Discharge planning Services  CM Consult  Post Acute Care Choice:  Durable Medical Equipment, Home Health Choice offered to:  Patient, Spouse  DME Arranged:  Oxygen, Walker rolling with seat, Nebulizer machine DME Agency:  [place]:  RN, PT [facility] Agency:  [place]  Status of Service:  Completed, signed off  If discussed at [place] of Stay Meetings, dates discussed:    Discharge Disposition: home/home health   Additional Comments:  Dawayne Patricia, RN 11/22/2017, 1:41 PM

## 2017-11-22 NOTE — Progress Notes (Signed)
   Subjective: Patient reports he was able to walk around some with oxygen yesterday and did okay, got a little short of breath.  He denies any chest pain or SOB at rest.  I gave him a breathing treatment Friday that he felt was helpful as he had some mild wheezing.  He feels he is urinating about the same amount as before.    Objective:  Vital signs in last 24 hours: Vitals:   11/21/17 1225 11/21/17 1647 11/21/17 2020 11/22/17 0701  BP: (!) 110/57 (!) 143/55 (!) 109/55 (!) 156/73  Pulse: 81 86 81 61  Resp: 20  18 18   Temp: 97.6 F (36.4 C)  97.6 F (36.4 C) (!) 97.5 F (36.4 C)  TempSrc: Oral  Oral Oral  SpO2: 96%  94% 91%  Weight:    168 lb (76.2 kg)  Height:       Gen. well-developed elderly man, appears comfortable in his bed, in no acute distress. Lungs.  Mild diffuse wheezing worse in bases, no rales or rhonci CVS. No JVD,  Normal rate and rhythm.2/6 systolic murmur Abdomen.  Soft, nontender, bowel sounds positive. Extremities.  1-2+ edema up to below the knees bilaterally.  Assessment/Plan:  84 year old gentleman with history of stage IV lung cancer, admitted for new onset heart failure.  HFrEF.  His symptoms continue to improve, requiring 3-4 L of oxygen to maintain his saturation.  BMP stable since switch to PO lasix Blood pressure remaines on softer side.  -Continue Lasix 40 mg twice daily. -will hold losartan for now due to low bp and AKI, if BP and AKI improves may try 12.5mg  dose -Continue monitoring daily BMP. -Continue low-dose carvedilol and aspirin.  Hypervolemic, hypotonic hyponatremia. 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  Urine studies were done after he was on Lasix.    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 -Patient will continue management according her oncologist.  Dispo: Anticipated  discharge with home health, in approximately 1-2 day(s).   Katherine Roan, MD 11/22/2017, 7:12 AM Pager: [phone]

## 2017-11-22 NOTE — Plan of Care (Signed)
  Skin Integrity: Risk for impaired skin integrity will decrease 11/22/2017 0740 - Completed/Met by Evert Kohl, RN

## 2017-11-22 NOTE — Discharge Summary (Signed)
Name: Harry Hinton MRN: [id number] DOB: 04-Apr-1933 84 y.o. PCP: Harry Broker, MD  Date of Admission: 11/18/2017  7:29 AM Date of Discharge:  Attending Physician: Harry Belt, MD  Discharge Diagnosis: 1. Principal Problem:   Congestive heart failure (CHF) (HCC) Active Problems:   Essential hypertension   Hyponatremia   Lung cancer, primary, with metastasis from lung to other site, right [facility])   Acute on chronic combined systolic and diastolic heart failure ([place])   Primary lung cancer with metastasis from lung to other site, left [facility])   Acute systolic heart failure [facility])   Discharge Medications: Allergies as of 11/22/2017      Reactions   Chlorhexidine Itching, Rash      Medication List    STOP taking these medications   moxifloxacin 400 MG tablet Commonly known as:  AVELOX   predniSONE 20 MG tablet Commonly known as:  DELTASONE   vitamin E 200 UNIT capsule     TAKE these medications   albuterol 108 (90 Base) MCG/ACT inhaler Commonly known as:  PROVENTIL HFA;VENTOLIN HFA Inhale 2 puffs into the lungs every 6 (six) hours as needed for wheezing or shortness of breath.   albuterol (2.5 MG/3ML) 0.083% nebulizer solution Commonly known as:  PROVENTIL Take 3 mLs (2.5 mg total) by nebulization every 6 (six) hours as needed for wheezing or shortness of breath.   aspirin EC 81 MG tablet Take 81 mg by mouth at bedtime.   carvedilol 3.125 MG tablet Commonly known as:  COREG Take 1 tablet (3.125 mg total) by mouth 2 (two) times daily with a meal.   dronabinol 2.5 MG capsule Commonly known as:  MARINOL Take 1 capsule (2.5 mg total) by mouth 2 (two) times daily before lunch and supper.   furosemide 40 MG tablet Commonly known as:  LASIX Take 1 tablet (40 mg total) by mouth 2 (two) times daily. What changed:    medication strength  when to take this   multivitamin with minerals Tabs tablet Take 1 tablet by mouth daily. Centrum Silver            Durable Medical Equipment  (From admission, onward)        Start     Ordered   11/22/17 1315  For home use only DME oxygen  Once    Question Answer Comment  Mode or (Route) Nasal cannula   Liters per Minute 2   Frequency Continuous (stationary and portable oxygen unit needed)   Oxygen conserving device Yes   Oxygen delivery system Gas      11/22/17 1326   11/22/17 0000  For home use only DME 4 wheeled rolling walker with seat    Question:  Patient needs a walker to treat with the following condition  Answer:  Heart failure with reduced ejection fraction and diastolic dysfunction ([place])   11/22/17 1334   11/22/17 0000  DME Nebulizer machine    Question Answer Comment  Patient needs a nebulizer to treat with the following condition Lung cancer, primary, with metastasis from lung to other site [facility])   Patient needs a nebulizer to treat with the following condition Emphysema of lung ([place])      11/22/17 1334      Disposition and follow-up:   Mr.Harry Hinton was discharged from [facility] in Stable condition.  At the hospital follow up visit please address:  1.   HFrEF Hyponatremia Lung cancer with metastasis to liver Paraseptal emphysema -check BMP, ensure  adequate urine output with lasix -check weight -ensure home nebulizer treatments working adequately  2.  Labs / imaging needed at time of follow-up: bmp  3.  Pending labs/ test needing follow-up: none  Follow-up Appointments: Follow-up Information    Croitoru, Mihai, MD Follow up.   Specialty:  Cardiology Why:  cardiology office scheduler will contact you to arrange 1-2 weeks transition of care followup with either Dr. Sallyanne Hinton or his PA or nurse practioner. Please give [place] a call if you do not hear from our scheduler in 3 business days.  Contact information: [address] [place] [address] [phone]        [facility] Follow up.   Specialty:  Home Health  Services Contact information: [address] [place] [address] [place] .   Contact information: [address] [place] [address] [phone]          Oncology appointment is on 11/30/17 with Dr. Baruch Hinton Course by problem list: Principal Problem:   Congestive heart failure (CHF) ([place]) Active Problems:   Essential hypertension   Hyponatremia   Lung cancer, primary, with metastasis from lung to other site, right [facility])   Acute on chronic combined systolic and diastolic heart failure ([place])   Primary lung cancer with metastasis from lung to other site, left [facility])   Acute systolic heart failure ([place])   Patient arrived to the emergency department after a 2-week history of increasing shortness of breath and lower extremity edema, nonproductive cough.  He was found to be fluid overloaded, he had an elevated BNP, his chest x-ray demonstrated left pleural effusion and vascular congestion.  Diuresis was initiated in the emergency department and he was placed on BiPAP therapy for hypoxemia and increased work of breathing.  Later that evening after he was diuresed he was able to come off of BiPAP and transferred to nasal cannula oxygen at about 4-5 L.  An echo was ordered, it showed new reduced ejection fraction 35-40% and G2DD compared to a study done 1 year prior with normal EF at 55-60%.  This presentation comes with the context of having been treated with carboplatin/etoposide 2 cycles and radiation therapy to the chest completed 06/01/17.  He was found to be hyponatremic on admission as well as he was diuresed throughout his stay it improved somewhat but still remained hyponatremic due to SIADH in the setting of his lung cancer.  His JVD improved and was no longer visible, his lower extremity edema improved, his lung exam improved.  His creatinine increased slightly after continuing diuresis.  He was switched to  oral Lasix from IV and his creatinine stabilized, his weight remained even after this switch to 40 mg oral Lasix twice a day.     Discharge Vitals:   BP (!) 134/94 (BP Location: Right Arm)   Pulse 90   Temp (!) 97.5 F (36.4 C) (Oral)   Resp 18   Ht 6' (1.829 m)   Wt 168 lb (76.2 kg)   SpO2 93%   BMI 22.78 kg/m   Pertinent Labs, Studies, and Procedures:  BNP    Component Value Date/Time   BNP 1,669.6 (H) 11/18/2017 0804   CBC Latest Ref Rng & Units 11/19/2017 11/18/2017 11/13/2017  WBC 4.0 - 10.5 K/uL 12.3(H) 15.6(H) 10.3(H)  Hemoglobin 13.0 - 17.0 g/dL 11.5(L) 12.8(L) -  Hematocrit 39.0 - 52.0 %  34.3(L) 37.7(L) 34.8(L)  Platelets 150 - 400 K/uL 232 292 239   BMP Latest Ref Rng & Units 11/22/2017 11/21/2017 11/20/2017  Glucose 65 - 99 mg/dL 106(H) 104(H) 96  BUN 6 - 20 mg/dL 42(H) 44(H) 30(H)  Creatinine 0.61 - 1.24 mg/dL 1.43(H) 1.43(H) 1.11  Sodium 135 - 145 mmol/L 126(L) 127(L) 126(L)  Potassium 3.5 - 5.1 mmol/L 3.9 4.1 4.0  Chloride 101 - 111 mmol/L 83(L) 84(L) 85(L)  CO2 22 - 32 mmol/L 31 31 29   Calcium 8.9 - 10.3 mg/dL 7.8(L) 8.0(L) 8.1(L)   Component4d ago Specimen DescriptionBLOOD LEFT ANTECUBITAL Special RequestsBOTTLES DRAWN AEROBIC AND ANAEROBIC Blood Culture adequate volume Culture  NO GROWTH 4 DAYS  Performed at [facility] [address], [place], [place] [address]   Report StatusPENDING      ProBNP No results found for: PROBNP  Discharge Instructions: Discharge Instructions    (HEART FAILURE PATIENTS) Call MD:  Anytime you have any of the following symptoms: 1) 3 pound weight gain in 24 hours or 5 pounds in 1 week 2) shortness of breath, with or without a dry hacking cough 3) swelling in the hands, feet or stomach 4) if you have to sleep on extra pillows at night in order to breathe.   Complete by:  As directed    Ambulatory referral to Home Health   Complete by:  As directed    Please evaluate Thurman Sarver for admission to [facility].  Disciplines  requested: Nursing, Physical Therapy  Services to provide: Evaluate  Physician to follow patient's care (the person listed here will be responsible for signing ongoing orders): PCP  Requested Start of Care Date: Tomorrow  I certify that this patient is under my care and that I, or a Nurse Practitioner or Physician's Assistant working with me, had a face-to-face encounter that meets the physician face-to-face requirements with patient on 11/22/17. The encounter with the patient was in whole, or in part for the following medical condition(s) which is the primary reason for home health care (List medical condition). Heart Failure with Reduced Ejection Fraction, Metastatic adenocarcinoma of the lung,   Special Instructions:   Does the patient have Medicare or Medicaid?:  Yes   The encounter with the patient was in whole, or in part, for the following medical condition, which is the primary reason for home health care:  Heart Failure with Reduced Ejection Fraction   Reason for Medically Necessary Home Health Services:   Skilled Nursing- Change/Decline in Patient Status Therapy- Therapeutic Exercises to Increase Strength and Endurance     My clinical findings support the need for the above services:  Shortness of breath with activity   I certify that, based on my findings, the following services are medically necessary home health services:   Nursing Physical therapy     Further, I certify that my clinical findings support that this patient is homebound due to:   Shortness of Breath with activity Ambulates short distances less than 300 feet     Call MD for:   Complete by:  As directed    Call MD for:  difficulty breathing, headache or visual disturbances   Complete by:  As directed    Call MD for:  extreme fatigue   Complete by:  As directed    Call MD for:  hives   Complete by:  As directed    Call MD for:  persistant dizziness or light-headedness   Complete by:  As directed  Call MD  for:  persistant nausea and vomiting   Complete by:  As directed    Call MD for:  redness, tenderness, or signs of infection (pain, swelling, redness, odor or green/yellow discharge around incision site)   Complete by:  As directed    Call MD for:  severe uncontrolled pain   Complete by:  As directed    Call MD for:  temperature >100.4   Complete by:  As directed    DME Nebulizer machine   Complete by:  As directed    Patient needs a nebulizer to treat with the following condition:   Lung cancer, primary, with metastasis from lung to other site [facility]) Emphysema of lung ([place])     Diet - low sodium heart healthy   Complete by:  As directed    Discharge instructions   Complete by:  As directed    It was a pleasure taking care of you.  Please keep an eye on your weight, fluid and salt intake as discussed.  I have also put additional instructions below based on changes in your weight.  Continue the 40 mg twice daily of the Lasix oral medication.  You have a follow-up appointment with cardiology in the next 2 weeks, they should be calling you in the next day to finalize the date of that appointment.  If they do not feel free to call and get that scheduled.  We will be sending you home with oxygen and a home health nurse will come by to help you facilitate your recovery.   For home use only DME 4 wheeled rolling walker with seat   Complete by:  As directed    Patient needs a walker to treat with the following condition:  Heart failure with reduced ejection fraction and diastolic dysfunction ([place])   Increase activity slowly   Complete by:  As directed       Signed: Katherine Roan, MD 11/22/2017, 1:35 PM

## 2017-11-22 NOTE — Progress Notes (Signed)
Progress Note  Patient Name: Harry Hinton Date of Encounter: 11/22/2017  Primary Cardiologist: No primary care provider on file.   Subjective   Looks and feels better today.  More alert.  Despite switching to oral diuretics, continued with a net negative fluid balance.  Weight reportedly up from yesterday, but I suspect this may be an error.  He has substantially less leg edema today.  Renal function today is virtually identical with yesterday.  K3.9, unchanged moderate hyponatremia.  Inpatient Medications    Scheduled Meds: . aspirin EC  81 mg Oral QHS  . carvedilol  3.125 mg Oral BID WC  . dronabinol  2.5 mg Oral BID AC  . enoxaparin (LOVENOX) injection  40 mg Subcutaneous Q24H  . furosemide  40 mg Oral BID  . mouth rinse  15 mL Mouth Rinse BID  . multivitamin with minerals  1 tablet Oral Daily   Continuous Infusions:  PRN Meds: albuterol, sodium chloride flush   Vital Signs    Vitals:   11/21/17 1225 11/21/17 1647 11/21/17 2020 11/22/17 0701  BP: (!) 110/57 (!) 143/55 (!) 109/55 (!) 156/73  Pulse: 81 86 81 61  Resp: 20  18 18   Temp: 97.6 F (36.4 C)  97.6 F (36.4 C) (!) 97.5 F (36.4 C)  TempSrc: Oral  Oral Oral  SpO2: 96%  94% 91%  Weight:    168 lb (76.2 kg)  Height:        Intake/Output Summary (Last 24 hours) at 11/22/2017 1145 Last data filed at 11/22/2017 0701 Gross per 24 hour  Intake 840 ml  Output 1850 ml  Net -1010 ml   Filed Weights   11/20/17 1852 11/21/17 0445 11/22/17 0701  Weight: 170 lb 13.7 oz (77.5 kg) 166 lb 10.7 oz (75.6 kg) 168 lb (76.2 kg)    Telemetry    Sinus rhythm- Personally Reviewed  ECG    No new trace- Personally Reviewed  Physical Exam  Looks comfortable GEN: No acute distress.   Neck: No JVD Cardiac: RRR, no murmurs, rubs, or gallops.  Respiratory: Clear to auscultation bilaterally. GI: Soft, nontender, non-distended  MS:  Only has minimal residual symmetrical ankle edema; No deformity. Neuro:  Nonfocal    Psych: Normal affect   Labs    Chemistry Recent Labs  Lab 11/18/17 0804  11/20/17 0525 11/21/17 0436 11/22/17 0406  NA 122*   < > 126* 127* 126*  K 4.3   < > 4.0 4.1 3.9  CL 82*   < > 85* 84* 83*  CO2 25   < > 29 31 31   GLUCOSE 153*   < > 96 104* 106*  BUN 28*   < > 30* 44* 42*  CREATININE 1.06   < > 1.11 1.43* 1.43*  CALCIUM 9.0   < > 8.1* 8.0* 7.8*  PROT 6.5  --   --   --   --   ALBUMIN 2.7*  --   --   --   --   AST 28  --   --   --   --   ALT 37  --   --   --   --   ALKPHOS 110  --   --   --   --   BILITOT 0.6  --   --   --   --   GFRNONAA >60   < > 60* 44* 44*  GFRAA >60   < > >60 51* 51*  ANIONGAP 15   < >  12 12 12    < > = values in this interval not displayed.     Hematology Recent Labs  Lab 11/18/17 0804 11/19/17 0414  WBC 15.6* 12.3*  RBC 4.32 3.91*  HGB 12.8* 11.5*  HCT 37.7* 34.3*  MCV 87.3 87.7  MCH 29.6 29.4  MCHC 34.0 33.5  RDW 15.4 15.9*  PLT 292 232    Cardiac Enzymes Recent Labs  Lab 11/18/17 1357 11/18/17 1926 11/19/17 0203  TROPONINI 0.79* 0.61* 0.48*    Recent Labs  Lab 11/18/17 0822  TROPIPOC 0.07     BNP Recent Labs  Lab 11/18/17 0804  BNP 1,669.6*     DDimer No results for input(s): DDIMER in the last 168 hours.   Radiology    No results found.  Cardiac Studies   Echo 11/18/2017 - Left ventricle: The cavity size was normal. Wall thickness was normal. Systolic function was moderately reduced. The estimated ejection fraction was in the range of 35% to 40%. Features are consistent with a pseudonormal left ventricular filling pattern, with concomitant abnormal relaxation and increased filling pressure (grade 2 diastolic dysfunction). - Aortic valve: Mildly to moderately calcified annulus. Moderately thickened, moderately calcified leaflets. There was mild regurgitation. - Mitral valve: Mildly calcified annulus. - Right ventricle: The cavity size was mildly dilated. Systolic function was mildly  reduced. - Pulmonary arteries: Systolic pressure was moderately increased. PA peak pressure: 45 mm Hg (S).    Patient Profile     84 y.o. male with newly diagnosed systolic heart failure and metastatic lung cancer.  Assessment & Plan    1. CHF: etiology uncertain, CAD probable, but workup deferred due to comorbid conditions.  Edema has decreased substantially and his breathing has improved, although he still requires oxygen supplementation.  Blood pressure is better. Would not add RAAS inhibitors at this time.  We will try to titrate carvedilol and addRAAS inhibitors at follow-up appointment in 1-2 weeks.  Keep on current dose of oral diuretics until his follow-up appointment. Had a long and detailed discussion regarding sodium restriction, daily weight monitoring, signs and symptoms of heart failure exacerbation.  It is likely that he will be very fragile from a hemodynamic standpoint, especially if he restarts chemotherapy, which may alter his oral intake and cause fluid loss via nausea and vomiting.  Careful and meticulous weight monitoring and tailored dosing of diuretics will be important. 2. Hyponatremia: largely SIADH, contribution of HF uncertain. 3. 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   For questions or updates, please contact [place] Please consult [email or web address] for contact info under Cardiology/STEMI.      Signed, Sanda Klein, MD  11/22/2017, 11:45 AM

## 2017-11-23 ENCOUNTER — Telehealth: Payer: Self-pay | Admitting: *Deleted

## 2017-11-23 LAB — CULTURE, BLOOD (ROUTINE X 2)
CULTURE: NO GROWTH
CULTURE: NO GROWTH
SPECIAL REQUESTS: ADEQUATE
Special Requests: ADEQUATE

## 2017-11-23 NOTE — Telephone Encounter (Signed)
Harry Hinton, from [facility], calling for orders. Patient was discharged from the hospital on Sunday, February 10th. They left his PAC accessed. [facility] would like orders to flush and deaccess his port.  Verbal orders given for NS 10cc flush followed by heparin 500units/67ml flush, then deaccess.

## 2017-11-24 ENCOUNTER — Emergency Department (HOSPITAL_COMMUNITY): Payer: Medicare Other

## 2017-11-24 ENCOUNTER — Inpatient Hospital Stay (HOSPITAL_COMMUNITY)
Admission: EM | Admit: 2017-11-24 | Discharge: 2017-11-29 | DRG: 292 | Disposition: A | Discharge status: Home / Self Care | Payer: Medicare Other | Attending: Internal Medicine | Admitting: Internal Medicine

## 2017-11-24 ENCOUNTER — Encounter (HOSPITAL_COMMUNITY): Payer: Self-pay | Admitting: Emergency Medicine

## 2017-11-24 DIAGNOSIS — J9611 Chronic respiratory failure with hypoxia: Secondary | ICD-10-CM | POA: Diagnosis present

## 2017-11-24 DIAGNOSIS — E871 Hypo-osmolality and hyponatremia: Secondary | ICD-10-CM | POA: Diagnosis not present

## 2017-11-24 DIAGNOSIS — R32 Unspecified urinary incontinence: Secondary | ICD-10-CM | POA: Diagnosis present

## 2017-11-24 DIAGNOSIS — C3490 Malignant neoplasm of unspecified part of unspecified bronchus or lung: Secondary | ICD-10-CM | POA: Diagnosis present

## 2017-11-24 DIAGNOSIS — Z883 Allergy status to other anti-infective agents status: Secondary | ICD-10-CM | POA: Diagnosis not present

## 2017-11-24 DIAGNOSIS — H9190 Unspecified hearing loss, unspecified ear: Secondary | ICD-10-CM | POA: Diagnosis present

## 2017-11-24 DIAGNOSIS — R350 Frequency of micturition: Secondary | ICD-10-CM | POA: Diagnosis present

## 2017-11-24 DIAGNOSIS — Z87891 Personal history of nicotine dependence: Secondary | ICD-10-CM | POA: Diagnosis not present

## 2017-11-24 DIAGNOSIS — C787 Secondary malignant neoplasm of liver and intrahepatic bile duct: Secondary | ICD-10-CM | POA: Diagnosis present

## 2017-11-24 DIAGNOSIS — Z7189 Other specified counseling: Secondary | ICD-10-CM | POA: Diagnosis not present

## 2017-11-24 DIAGNOSIS — J438 Other emphysema: Secondary | ICD-10-CM | POA: Diagnosis present

## 2017-11-24 DIAGNOSIS — I509 Heart failure, unspecified: Secondary | ICD-10-CM

## 2017-11-24 DIAGNOSIS — Z85118 Personal history of other malignant neoplasm of bronchus and lung: Secondary | ICD-10-CM

## 2017-11-24 DIAGNOSIS — E222 Syndrome of inappropriate secretion of antidiuretic hormone: Secondary | ICD-10-CM | POA: Diagnosis present

## 2017-11-24 DIAGNOSIS — J700 Acute pulmonary manifestations due to radiation: Secondary | ICD-10-CM | POA: Diagnosis present

## 2017-11-24 DIAGNOSIS — C781 Secondary malignant neoplasm of mediastinum: Secondary | ICD-10-CM | POA: Diagnosis present

## 2017-11-24 DIAGNOSIS — J9620 Acute and chronic respiratory failure, unspecified whether with hypoxia or hypercapnia: Secondary | ICD-10-CM | POA: Diagnosis not present

## 2017-11-24 DIAGNOSIS — J9621 Acute and chronic respiratory failure with hypoxia: Secondary | ICD-10-CM | POA: Diagnosis not present

## 2017-11-24 DIAGNOSIS — I110 Hypertensive heart disease with heart failure: Secondary | ICD-10-CM | POA: Diagnosis present

## 2017-11-24 DIAGNOSIS — M549 Dorsalgia, unspecified: Secondary | ICD-10-CM | POA: Diagnosis present

## 2017-11-24 DIAGNOSIS — Z888 Allergy status to other drugs, medicaments and biological substances status: Secondary | ICD-10-CM

## 2017-11-24 DIAGNOSIS — C3432 Malignant neoplasm of lower lobe, left bronchus or lung: Secondary | ICD-10-CM | POA: Diagnosis not present

## 2017-11-24 DIAGNOSIS — I429 Cardiomyopathy, unspecified: Secondary | ICD-10-CM | POA: Diagnosis present

## 2017-11-24 DIAGNOSIS — K219 Gastro-esophageal reflux disease without esophagitis: Secondary | ICD-10-CM | POA: Diagnosis present

## 2017-11-24 DIAGNOSIS — Y842 Radiological procedure and radiotherapy as the cause of abnormal reaction of the patient, or of later complication, without mention of misadventure at the time of the procedure: Secondary | ICD-10-CM | POA: Diagnosis present

## 2017-11-24 DIAGNOSIS — I080 Rheumatic disorders of both mitral and aortic valves: Secondary | ICD-10-CM | POA: Diagnosis present

## 2017-11-24 DIAGNOSIS — Z9221 Personal history of antineoplastic chemotherapy: Secondary | ICD-10-CM | POA: Diagnosis not present

## 2017-11-24 DIAGNOSIS — N289 Disorder of kidney and ureter, unspecified: Secondary | ICD-10-CM

## 2017-11-24 DIAGNOSIS — Z66 Do not resuscitate: Secondary | ICD-10-CM | POA: Diagnosis present

## 2017-11-24 DIAGNOSIS — R0989 Other specified symptoms and signs involving the circulatory and respiratory systems: Secondary | ICD-10-CM | POA: Diagnosis not present

## 2017-11-24 DIAGNOSIS — C3492 Malignant neoplasm of unspecified part of left bronchus or lung: Secondary | ICD-10-CM | POA: Diagnosis present

## 2017-11-24 DIAGNOSIS — Z7982 Long term (current) use of aspirin: Secondary | ICD-10-CM | POA: Diagnosis not present

## 2017-11-24 DIAGNOSIS — Z79899 Other long term (current) drug therapy: Secondary | ICD-10-CM

## 2017-11-24 DIAGNOSIS — I5043 Acute on chronic combined systolic (congestive) and diastolic (congestive) heart failure: Secondary | ICD-10-CM | POA: Diagnosis present

## 2017-11-24 DIAGNOSIS — R0902 Hypoxemia: Secondary | ICD-10-CM | POA: Diagnosis not present

## 2017-11-24 DIAGNOSIS — J439 Emphysema, unspecified: Secondary | ICD-10-CM | POA: Diagnosis not present

## 2017-11-24 DIAGNOSIS — J9811 Atelectasis: Secondary | ICD-10-CM | POA: Diagnosis present

## 2017-11-24 DIAGNOSIS — I5023 Acute on chronic systolic (congestive) heart failure: Secondary | ICD-10-CM | POA: Diagnosis not present

## 2017-11-24 DIAGNOSIS — Z515 Encounter for palliative care: Secondary | ICD-10-CM

## 2017-11-24 DIAGNOSIS — I10 Essential (primary) hypertension: Secondary | ICD-10-CM | POA: Diagnosis not present

## 2017-11-24 DIAGNOSIS — Z923 Personal history of irradiation: Secondary | ICD-10-CM | POA: Diagnosis not present

## 2017-11-24 DIAGNOSIS — Z9981 Dependence on supplemental oxygen: Secondary | ICD-10-CM

## 2017-11-24 DIAGNOSIS — R1310 Dysphagia, unspecified: Secondary | ICD-10-CM | POA: Diagnosis not present

## 2017-11-24 DIAGNOSIS — Z9111 Patient's noncompliance with dietary regimen: Secondary | ICD-10-CM | POA: Diagnosis not present

## 2017-11-24 HISTORY — DX: Iron deficiency anemia, unspecified: D50.9

## 2017-11-24 HISTORY — DX: Heart failure, unspecified: I50.9

## 2017-11-24 HISTORY — DX: Dependence on supplemental oxygen: Z99.81

## 2017-11-24 HISTORY — DX: Migraine, unspecified, not intractable, without status migrainosus: G43.909

## 2017-11-24 LAB — CBC WITH DIFFERENTIAL/PLATELET
BASOS PCT: 0 %
Basophils Absolute: 0 10*3/uL (ref 0.0–0.1)
EOS PCT: 1 %
Eosinophils Absolute: 0.1 10*3/uL (ref 0.0–0.7)
HEMATOCRIT: 33.6 % — AB (ref 39.0–52.0)
Hemoglobin: 11.3 g/dL — ABNORMAL LOW (ref 13.0–17.0)
LYMPHS PCT: 7 %
Lymphs Abs: 0.8 10*3/uL (ref 0.7–4.0)
MCH: 29.6 pg (ref 26.0–34.0)
MCHC: 33.6 g/dL (ref 30.0–36.0)
MCV: 88 fL (ref 78.0–100.0)
Monocytes Absolute: 1 10*3/uL (ref 0.1–1.0)
Monocytes Relative: 8 %
NEUTROS ABS: 9.8 10*3/uL — AB (ref 1.7–7.7)
Neutrophils Relative %: 84 %
PLATELETS: 196 10*3/uL (ref 150–400)
RBC: 3.82 MIL/uL — AB (ref 4.22–5.81)
RDW: 15.4 % (ref 11.5–15.5)
WBC: 11.7 10*3/uL — AB (ref 4.0–10.5)

## 2017-11-24 LAB — COMPREHENSIVE METABOLIC PANEL
ALBUMIN: 2.4 g/dL — AB (ref 3.5–5.0)
ALT: 31 U/L (ref 17–63)
AST: 27 U/L (ref 15–41)
Alkaline Phosphatase: 105 U/L (ref 38–126)
Anion gap: 11 (ref 5–15)
BUN: 33 mg/dL — AB (ref 6–20)
CHLORIDE: 80 mmol/L — AB (ref 101–111)
CO2: 32 mmol/L (ref 22–32)
CREATININE: 1.02 mg/dL (ref 0.61–1.24)
Calcium: 8.1 mg/dL — ABNORMAL LOW (ref 8.9–10.3)
GFR calc Af Amer: >60 mL/min (ref >60)
Glucose, Bld: 112 mg/dL — ABNORMAL HIGH (ref 65–99)
POTASSIUM: 4.1 mmol/L (ref 3.5–5.1)
Sodium: 123 mmol/L — ABNORMAL LOW (ref 135–145)
Total Bilirubin: 0.8 mg/dL (ref 0.3–1.2)
Total Protein: 6.1 g/dL — ABNORMAL LOW (ref 6.5–8.1)

## 2017-11-24 LAB — I-STAT TROPONIN, ED: TROPONIN I, POC: 0 ng/mL (ref 0.00–0.08)

## 2017-11-24 LAB — BRAIN NATRIURETIC PEPTIDE: B Natriuretic Peptide: 810.6 pg/mL — ABNORMAL HIGH (ref 0.0–100.0)

## 2017-11-24 MED ORDER — POLYETHYLENE GLYCOL 3350 17 G PO PACK
17.0000 g | PACK | Freq: Every day | ORAL | Status: DC | PRN
  Administered 2017-11-26 – 2017-11-27 (×2): 17 g via ORAL
  Filled 2017-11-24 (×2): qty 1

## 2017-11-24 MED ORDER — ACETAMINOPHEN 325 MG PO TABS: 650.0000 mg | ORAL_TABLET | Freq: Four times a day (QID) | ORAL | Status: DC | PRN

## 2017-11-24 MED ORDER — SODIUM CHLORIDE 0.9% FLUSH
10.0000 mL | INTRAVENOUS | Status: DC | PRN
  Administered 2017-11-26: 10 mL
  Filled 2017-11-24: qty 40

## 2017-11-24 MED ORDER — ACETAMINOPHEN 650 MG RE SUPP: 650.0000 mg | Freq: Four times a day (QID) | RECTAL | Status: DC | PRN

## 2017-11-24 MED ORDER — ENOXAPARIN SODIUM 40 MG/0.4ML ~~LOC~~ SOLN
40.0000 mg | SUBCUTANEOUS | Status: DC
  Administered 2017-11-24 – 2017-11-28 (×5): 40 mg via SUBCUTANEOUS
  Filled 2017-11-24 (×5): qty 0.4

## 2017-11-24 MED ORDER — DRONABINOL 2.5 MG PO CAPS
2.5000 mg | ORAL_CAPSULE | Freq: Two times a day (BID) | ORAL | Status: DC
  Administered 2017-11-25 – 2017-11-29 (×9): 2.5 mg via ORAL
  Filled 2017-11-24 (×9): qty 1

## 2017-11-24 MED ORDER — FUROSEMIDE 10 MG/ML IJ SOLN
40.0000 mg | Freq: Two times a day (BID) | INTRAMUSCULAR | Status: DC
  Administered 2017-11-25: 40 mg via INTRAVENOUS
  Filled 2017-11-24: qty 4

## 2017-11-24 MED ORDER — SODIUM CHLORIDE 0.9% FLUSH
10.0000 mL | Freq: Two times a day (BID) | INTRAVENOUS | Status: DC
  Administered 2017-11-25 – 2017-11-26 (×4): 10 mL

## 2017-11-24 MED ORDER — CARVEDILOL 3.125 MG PO TABS
3.1250 mg | ORAL_TABLET | Freq: Two times a day (BID) | ORAL | Status: DC
  Administered 2017-11-25 – 2017-11-26 (×4): 3.125 mg via ORAL
  Filled 2017-11-24 (×4): qty 1

## 2017-11-24 MED ORDER — FUROSEMIDE 10 MG/ML IJ SOLN
60.0000 mg | Freq: Once | INTRAMUSCULAR | Status: AC
  Administered 2017-11-24: 60 mg via INTRAVENOUS
  Filled 2017-11-24: qty 6

## 2017-11-24 MED ORDER — ASPIRIN EC 81 MG PO TBEC
81.0000 mg | DELAYED_RELEASE_TABLET | Freq: Every day | ORAL | Status: DC
  Administered 2017-11-24 – 2017-11-28 (×5): 81 mg via ORAL
  Filled 2017-11-24 (×5): qty 1

## 2017-11-24 MED ORDER — ADULT MULTIVITAMIN W/MINERALS CH
1.0000 | ORAL_TABLET | Freq: Every day | ORAL | Status: DC
  Administered 2017-11-25 – 2017-11-29 (×5): 1 via ORAL
  Filled 2017-11-24 (×5): qty 1

## 2017-11-24 NOTE — ED Provider Notes (Addendum)
[facility]ERGENCY DEPARTMENT Provider Note   CSN: [id number] Arrival date & time: 11/24/17  1306     History   Chief Complaint Chief Complaint  Patient presents with  . Shortness of Breath    HPI Harry Hinton is a 84 y.o. male.  Patient is an 84 year old male with a history of lung cancer with metastases to the liver, recent admission to the hospital discharged 2 days ago with CHF and an EF of 30-35%, AK I and anemia who is returning today for worsening shortness of breath.  While in the hospital he was diuresed with IV diuretics.  Also thought to have a component of SIADH and was fluid restricted.  Patient had mild bump in his creatinine from 1.1-1.4 but was sent home with oral diuretics on 2-3 L of oxygen and was doing well.  Patient states yesterday he was okay but this morning he has increased 4 pounds has become more short of breath even at rest.  However with exertion the shortness of breath is significantly worse.  He denies productive cough, fever, abdominal pain, chest pain or vomiting.  He states he has not had a great appetite since being home which he is trying to follow a low-sodium diet.  He has not necessarily restricted his fluids significantly.  He states he drank a cup of orange juice, coffee and 2 bottles of water may be 3 yesterday.  He is taking the Lasix as prescribed as well as his other medications.  He has noticed worsening swelling in his lower legs again.   The history is provided by the patient.    Past Medical History:  Diagnosis Date  . 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 AKI (acute kidney injury) ([place]) 03/10/2017   NOTED ON ECHO  . Anemia    history of  . Aortic valve sclerosis 03/09/2017   NOTED ON ECHO  . Bronchitis   . Cancer ([place])     left lung CA  . Counseling regarding goals of care 03/26/2017  . Dyspnea   . GERD (gastroesophageal reflux disease)   . Hearing loss   . History of blood  transfusion   . Hypertension   . Lung cancer, primary, with metastasis from lung to other site, right ([place]) 11/13/2017  . LVH (left ventricular hypertrophy) 03/10/2017   mild, NOTED ON ECHO  . MR (mitral regurgitation)    trivial, NOTED ON ECHO  . Pneumonia   . Port-A-Cath in place   . Sinus problem   . Tingling    right foot    Patient Active Problem List   Diagnosis Date Noted  . Paraseptal emphysema ([place]) 11/22/2017  . Acute systolic heart failure ([place])   . Acute on chronic combined systolic and diastolic heart failure ([place])   . Primary lung cancer with metastasis from lung to other site, left ([place])   . Congestive heart failure (CHF) ([place]) 11/18/2017  . Lung cancer, primary, with metastasis from lung to other site, right ([place]) 11/13/2017  . Adenocarcinoma of lung metastatic to liver, right ([place]) 11/13/2017  . IDA (iron deficiency anemia) 08/26/2017  . Counseling regarding goals of care 03/26/2017  . Abdominal aortic aneurysm dissection ([place])   . Tobacco abuse 03/10/2017  . Dizziness 03/10/2017  . Hyponatremia 03/10/2017  . Essential hypertension 03/09/2017  . Atypical chest pain 03/09/2017  . Malignant neoplasm of hilus of left lung ([place]) 03/09/2017    Past Surgical History:  Procedure Laterality Date  .  COLONOSCOPY    . EYE SURGERY     bilateral cataracts in approx. 2016  . IR FLUORO GUIDE PORT INSERTION RIGHT  04/06/2017  . IR RADIOLOGIST EVAL & MGMT  09/30/2017  . IR US GUIDE VASC ACCESS RIGHT  04/06/2017  . LIVER BIOPSY    . RADIOLOGY WITH ANESTHESIA N/A 11/06/2017   Procedure: CT MICROWAVE THERMAL ABLATION - LIVER;  Surgeon: Aletta Edouard, MD;  Location: [facility];  Service: Radiology;  Laterality: N/A;  . TONSILLECTOMY     age 63  . VASECTOMY    . VIDEO BRONCHOSCOPY WITH ENDOBRONCHIAL ULTRASOUND  03/11/2017   Procedure: VIDEO BRONCHOSCOPY WITH ENDOBRONCHIAL ULTRASOUND;  Surgeon: Collene Gobble, MD;  Location: [facility];  Service: Thoracic;;       Home  Medications    Prior to Admission medications   Medication Sig Start Date End Date Taking? Authorizing Provider  albuterol (PROVENTIL HFA;VENTOLIN HFA) 108 (90 Base) MCG/ACT inhaler Inhale 2 puffs into the lungs every 6 (six) hours as needed for wheezing or shortness of breath. 11/22/17 02/20/18  Katherine Roan, MD  albuterol (PROVENTIL) (2.5 MG/3ML) 0.083% nebulizer solution Take 3 mLs (2.5 mg total) by nebulization every 6 (six) hours as needed for wheezing or shortness of breath. 11/22/17   Katherine Roan, MD  aspirin EC 81 MG tablet Take 81 mg by mouth at bedtime.     [provider]  carvedilol (COREG) 3.125 MG tablet Take 1 tablet (3.125 mg total) by mouth 2 (two) times daily with a meal. 03/12/17   Mikhail, Velta Addison, DO  dronabinol (MARINOL) 2.5 MG capsule Take 1 capsule (2.5 mg total) by mouth 2 (two) times daily before lunch and supper. 11/13/17   Volanda Napoleon, MD  furosemide (LASIX) 40 MG tablet Take 1 tablet (40 mg total) by mouth 2 (two) times daily. 11/22/17   Katherine Roan, MD  Multiple Vitamin (MULTIVITAMIN WITH MINERALS) TABS tablet Take 1 tablet by mouth daily. Centrum Silver    [provider]  prochlorperazine (COMPAZINE) 10 MG tablet Take 1 tablet (10 mg total) by mouth every 6 (six) hours as needed (Nausea or vomiting). 04/06/17 11/13/17  Volanda Napoleon, MD    Family History Family History  Problem Relation Age of Onset  . Heart attack Father 65  . Congestive Heart Failure Father     Social History Social History   Tobacco Use  . Smoking status: Former Smoker    Types: Cigarettes    Last attempt to quit: 03/10/1987    Years since quitting: 30.7  . Smokeless tobacco: Never Used  Substance Use Topics  . Alcohol use: Yes    Comment: occasional beer or occasional wine  . Drug use: No     Allergies   Chlorhexidine   Review of Systems Review of Systems  All other systems reviewed and are negative.    Physical Exam Updated Vital  Signs BP 117/72   Pulse 86   Temp 97.8 F (36.6 C)   Resp 20   SpO2 100%   Physical Exam  Constitutional: He is oriented to person, place, and time. He appears well-developed and well-nourished. No distress.  HENT:  Head: Normocephalic and atraumatic.  Mouth/Throat: Oropharynx is clear and moist.  Eyes: Conjunctivae and EOM are normal. Pupils are equal, round, and reactive to light.  Neck: Normal range of motion. Neck supple.  Cardiovascular: Normal rate, regular rhythm and intact distal pulses.  No murmur heard. Pulmonary/Chest: Effort normal. Tachypnea noted. No  respiratory distress. He has decreased breath sounds in the right lower field and the left lower field. He has wheezes. He has rales.  Abdominal: Soft. He exhibits no distension. There is no tenderness. There is no rebound and no guarding.  Musculoskeletal: Normal range of motion. He exhibits edema. He exhibits no tenderness.  3+ pitting edema to the mid shin bilaterally  Neurological: He is alert and oriented to person, place, and time. He has normal strength. No sensory deficit.  Skin: Skin is warm and dry. Capillary refill takes 2 to 3 seconds. No rash noted. No erythema.  Psychiatric: He has a normal mood and affect. His behavior is normal.  Nursing note and vitals reviewed.    ED Treatments / Results  Labs (all labs ordered are listed, but only abnormal results are displayed) Labs Reviewed  CBC WITH DIFFERENTIAL/PLATELET - Abnormal; Notable for the following components:      Result Value   WBC 11.7 (*)    RBC 3.82 (*)    Hemoglobin 11.3 (*)    HCT 33.6 (*)    Neutro Abs 9.8 (*)    All other components within normal limits  COMPREHENSIVE METABOLIC PANEL - Abnormal; Notable for the following components:   Sodium 123 (*)    Chloride 80 (*)    Glucose, Bld 112 (*)    BUN 33 (*)    Calcium 8.1 (*)    Total Protein 6.1 (*)    Albumin 2.4 (*)    All other components within normal limits  BRAIN NATRIURETIC  PEPTIDE - Abnormal; Notable for the following components:   B Natriuretic Peptide 810.6 (*)    All other components within normal limits  I-STAT TROPONIN, ED    ED ECG REPORT   Date: 11/24/2017  Rate: 96  Rhythm: normal sinus rhythm and premature atrial contractions (PAC)  QRS Axis: normal  Intervals: QT prolonged  ST/T Wave abnormalities: nonspecific T wave changes  Conduction Disutrbances:none  Narrative Interpretation:   Old EKG Reviewed: unchanged  I have personally reviewed the EKG tracing and agree with the computerized printout as noted.   Radiology Dg Chest Port 1 View  Result Date: 11/24/2017 CLINICAL DATA:  Shortness of breath. Hypertension. History of left-sided lung carcinoma EXAM: PORTABLE CHEST 1 VIEW COMPARISON:  November 18, 2017 FINDINGS: There is fibrosis in the left mid lower lung zones medially. There is a small left pleural effusion with atelectatic change in both lung bases. No well-defined mass. Heart size is normal. The pulmonary vascularity appears normal. No adenopathy. Port-A-Cath tip is in the right atrium with the [facility] needle inserted into the port. There is a presumed calcified lymph node in the right axilla, stable. Bones are osteoporotic. IMPRESSION: Fibrosis, likely due to radiation therapy change, on the left. Small left pleural effusion with atelectasis in the left base. There is also mild right base atelectasis. No new opacity. Stable cardiac silhouette. Port-A-Cath tip in right atrium. Bones osteoporotic. Electronically Signed   By: Lowella Grip III M.D.   On: 11/24/2017 13:42    Procedures Procedures (including critical care time)  Medications Ordered in ED Medications  furosemide (LASIX) injection 60 mg (not administered)     Initial Impression / Assessment and Plan / ED Course  I have reviewed the triage vital signs and the nursing notes.  Pertinent labs & imaging results that were available during my care of the patient were  reviewed by me and considered in my medical decision making (see chart for details).  Elderly gentleman with multiple medical problems recently admitted for CHF with echo showing significant decrease in ejection fraction of 30-35% sent home with Lasix 2 days ago presenting today with worsening shortness of breath and worsening swelling in his legs.  Concern for recurrent/persistent CHF.  Not responding to oral medications.  Patient is not eating excessive salt at home however he is not completely restricting his fluids and there was a component thought to be of SIADH.  Patient denies any chest pain infectious symptoms or abdominal symptoms.  He is slightly tachypneic but satting 99% on 3 L.  He has decreased breath sounds, mild wet rales and mild wheezing.  EMS did give albuterol which patient states did not significantly improve his shortness of breath.  He appears fluid overloaded on exam.  Despite having lung cancer he has never used inhalers regularly.  CBC, CMP, BNP, troponin, EKG, chest x-ray pending.  Patient was 168 when leaving the hospital and will recheck his weight but he thinks he is up 4 pounds from yesterday.  Currently taking 40 mg of Lasix twice daily.  2:50 PM Labs with persistent signs of CHF and hyponatremia.  Patient given IV Lasix.  Will admit for further care.  CRITICAL CARE Performed by: Katrinia Straker Total critical care time: 30 minutes Critical care time was exclusive of separately billable procedures and treating other patients. Critical care was necessary to treat or prevent imminent or life-threatening deterioration. Critical care was time spent personally by me on the following activities: development of treatment plan with patient and/or surrogate as well as nursing, discussions with consultants, evaluation of patient's response to treatment, examination of patient, obtaining history from patient or surrogate, ordering and performing treatments and interventions,  ordering and review of laboratory studies, ordering and review of radiographic studies, pulse oximetry and re-evaluation of patient's condition.  Final Clinical Impressions(s) / ED Diagnoses   Final diagnoses:  Acute on chronic congestive heart failure, unspecified heart failure type [facility])    ED Discharge Orders    None       Blanchie Dessert, MD 11/24/17 1451    Blanchie Dessert, MD 12/03/17 1013

## 2017-11-24 NOTE — ED Triage Notes (Signed)
Pt arrives via [facility] c/o increasing sob that started this am, has hx of lung cancer and was d/c last Sunday, has porta cath for chemo, is on home o2 at 3 l [place] , ems gave one albuteral tx and pt has  styates he is a little better,

## 2017-11-24 NOTE — ED Notes (Signed)
Pt had port accessed on arrival. Port site confirmed by IV team. States ready for use.

## 2017-11-24 NOTE — H&P (Signed)
Date: 11/24/2017               Patient Name:  Harry Hinton MRN: [id number]  DOB: 27-Jul-1933 Age / Sex: 84 y.o., male   PCP: Myrlene Broker, MD         Medical Service: Internal Medicine Teaching Service         Attending Physician: Dr. Oval Linsey, MD    First Contact: Dr. Shan Levans Pager: [phone]  Second Contact: Dr. Reesa Chew Pager: [phone]       After Hours (After 5p/  First Contact Pager: [phone]  weekends / holidays): Second Contact Pager: [phone]   Chief Complaint: Shortness of breath  History of Present Illness: Harry Hinton is a 84 yo M a history of metastatic lung adenocarcinoma involving mediastinum and liver s/p carboplatin/etoposide and local radiation last year, hypertension, abdominal aortic dissection, recently admitted and diagnosed with heart failure (discharged 11/22/17) who presents with a return of his shortness of breath and lower extremity edema. History provided with the assistance of his family who were at beside. Patient's shortness of breath began to return the evening after admission and worsened today. His home health nurse arrived for her first visit today and per the family when she heard fluid on his lungs she advised the family to call their PCP. The PCP's office advised the patient to return to the ED. Of note, continued present of patients effusion was known and expected. Patient state that he had been eating less salt and not as much fluids (although he his drinking three bottle of water, some coffee, and juice each day). He additionally report some urinary incontinence since discharge, being unable to make it to the restroom in time.  In the ED, patient vitals notable for RR that was elevated to 20-30 at times. CBC showed WBC 11.7 stable from last admission; CMP showed Na 123, Cl 80, Cr 1.02 (baseline), Alb 2.4; BNP 810; Troponin 0.00. Patient received 60mg  IV Lasix in the ED. Patient to be admitted for further workup and care.  Meds:  Current Meds    Medication Sig  . albuterol (PROVENTIL HFA;VENTOLIN HFA) 108 (90 Base) MCG/ACT inhaler Inhale 2 puffs into the lungs every 6 (six) hours as needed for wheezing or shortness of breath.  Marland Kitchen albuterol (PROVENTIL) (2.5 MG/3ML) 0.083% nebulizer solution Take 3 mLs (2.5 mg total) by nebulization every 6 (six) hours as needed for wheezing or shortness of breath.  Marland Kitchen aspirin EC 81 MG tablet Take 81 mg by mouth at bedtime.   . carvedilol (COREG) 3.125 MG tablet Take 1 tablet (3.125 mg total) by mouth 2 (two) times daily with a meal.  . dronabinol (MARINOL) 2.5 MG capsule Take 1 capsule (2.5 mg total) by mouth 2 (two) times daily before lunch and supper.  . furosemide (LASIX) 40 MG tablet Take 1 tablet (40 mg total) by mouth 2 (two) times daily.  . Multiple Vitamin (MULTIVITAMIN WITH MINERALS) TABS tablet Take 1 tablet by mouth daily. Centrum Silver    Allergies: Allergies as of 11/24/2017 - Review Complete 11/24/2017  Allergen Reaction Noted  . Chlorhexidine Itching and Rash 09/17/2017   Past Medical History:  Diagnosis Date  . Adenocarcinoma of lung metastatic to liver, right ([place]) 11/13/2017  . AKI (acute kidney injury) ([place]) 03/10/2017   NOTED ON ECHO  . Anemia    history of  . Aortic valve sclerosis 03/09/2017   NOTED ON ECHO  . Bronchitis   . Cancer ([place])  left lung CA  . Counseling regarding goals of care 03/26/2017  . Dyspnea   . GERD (gastroesophageal reflux disease)   . Hearing loss   . History of blood transfusion   . Hypertension   . Lung cancer, primary, with metastasis from lung to other site, right ([place]) 11/13/2017  . LVH (left ventricular hypertrophy) 03/10/2017   mild, NOTED ON ECHO  . MR (mitral regurgitation)    trivial, NOTED ON ECHO  . Pneumonia   . Port-A-Cath in place   . Sinus problem   . Tingling    right foot    Family History: Family History  Problem Relation Age of Onset  . Heart attack Father 13  . Congestive Heart Failure Father   - Reviewed on  admission  Social History: Social History   Tobacco Use  . Smoking status: Former Smoker    Types: Cigarettes    Last attempt to quit: 03/10/1987    Years since quitting: 30.7  . Smokeless tobacco: Never Used  Substance Use Topics  . Alcohol use: Yes    Comment: occasional beer or occasional wine  . Drug use: No  - Reviewed on admission  Review of Systems: A complete ROS was negative except as per HPI.  Physical Exam: Blood pressure 120/83, pulse 90, temperature 97.8 F (36.6 C), resp. rate (!) 21, SpO2 99 %. Physical Exam  Constitutional: He is oriented to person, place, and time.  Chronically Ill male in mild distress  HENT:  Head: Normocephalic and atraumatic.  Eyes: EOM are normal. Right eye exhibits no discharge. Left eye exhibits no discharge.  Cardiovascular: Normal rate, regular rhythm and normal heart sounds.  Pulmonary/Chest: Effort normal.  Decreased breath sounds R base Implanted port R anterior chest  Abdominal: Soft. Bowel sounds are normal.  LLQ tender to palpation  Musculoskeletal: He exhibits no tenderness or deformity.  2+ Edema LLE 1-2+ Edema RLE  Neurological: He is alert and oriented to person, place, and time.  Skin: Skin is warm and dry.   EKG: ordered but not performed  CXR: personally reviewed and I agree with: IMPRESSION: Fibrosis, likely due to radiation therapy change, on the left. Small left pleural effusion with atelectasis in the left base. There is also mild right base atelectasis. No new opacity. Stable cardiac silhouette. Port-A-Cath tip in right atrium. Bones osteoporotic.  Assessment & Plan by Problem: Harry Hinton is a 84 yo M a history of metastatic lung adenocarcinoma involving mediastinum and liver s/p carboplatin/etoposide and local radiation last year, hypertension, abdominal aortic dissection, recently admitted and diagnosed with heart failure (discharged 11/22/17) who presents with a return of his shortness of breath and lower  extremity edema.  HFrEF. Patient experiencing worsening symptoms since discharged. On 40mg  PO Lasix BID. His was discharged on 2L home oxygen as well. BMP shows improvement in Cr since last admission, patient will be able to tolerate higher dose on discharge. 60mg  IV in ED. - Lasix 40mg  IV BID - Will consider ACE or ARB if BP can tolerate it - Continue Carvedilol 3.125mg  Daily - Continue ASA 81mg  Daily - AM BMP  Hypervolemic, Hypotonic Hyponatremia: Persisted from recent admission. 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  Signed: Neva Seat, MD 11/24/2017, 6:36 PM  Pager: [phone]

## 2017-11-24 NOTE — ED Notes (Signed)
Admitting MD at bedsidce

## 2017-11-25 ENCOUNTER — Other Ambulatory Visit: Payer: Self-pay

## 2017-11-25 ENCOUNTER — Encounter (HOSPITAL_COMMUNITY): Payer: Self-pay | Admitting: General Practice

## 2017-11-25 DIAGNOSIS — Z87891 Personal history of nicotine dependence: Secondary | ICD-10-CM

## 2017-11-25 DIAGNOSIS — J700 Acute pulmonary manifestations due to radiation: Secondary | ICD-10-CM | POA: Diagnosis present

## 2017-11-25 DIAGNOSIS — Z9111 Patient's noncompliance with dietary regimen: Secondary | ICD-10-CM

## 2017-11-25 DIAGNOSIS — I5043 Acute on chronic combined systolic (congestive) and diastolic (congestive) heart failure: Secondary | ICD-10-CM

## 2017-11-25 DIAGNOSIS — C787 Secondary malignant neoplasm of liver and intrahepatic bile duct: Secondary | ICD-10-CM

## 2017-11-25 DIAGNOSIS — C3432 Malignant neoplasm of lower lobe, left bronchus or lung: Secondary | ICD-10-CM

## 2017-11-25 DIAGNOSIS — K219 Gastro-esophageal reflux disease without esophagitis: Secondary | ICD-10-CM

## 2017-11-25 DIAGNOSIS — Z923 Personal history of irradiation: Secondary | ICD-10-CM

## 2017-11-25 DIAGNOSIS — J9620 Acute and chronic respiratory failure, unspecified whether with hypoxia or hypercapnia: Secondary | ICD-10-CM

## 2017-11-25 DIAGNOSIS — R0989 Other specified symptoms and signs involving the circulatory and respiratory systems: Secondary | ICD-10-CM

## 2017-11-25 DIAGNOSIS — Z79899 Other long term (current) drug therapy: Secondary | ICD-10-CM

## 2017-11-25 DIAGNOSIS — J439 Emphysema, unspecified: Secondary | ICD-10-CM

## 2017-11-25 DIAGNOSIS — R0902 Hypoxemia: Secondary | ICD-10-CM

## 2017-11-25 DIAGNOSIS — E871 Hypo-osmolality and hyponatremia: Secondary | ICD-10-CM

## 2017-11-25 DIAGNOSIS — I110 Hypertensive heart disease with heart failure: Principal | ICD-10-CM

## 2017-11-25 DIAGNOSIS — Z515 Encounter for palliative care: Secondary | ICD-10-CM

## 2017-11-25 DIAGNOSIS — E222 Syndrome of inappropriate secretion of antidiuretic hormone: Secondary | ICD-10-CM | POA: Diagnosis present

## 2017-11-25 DIAGNOSIS — I509 Heart failure, unspecified: Secondary | ICD-10-CM

## 2017-11-25 DIAGNOSIS — Z7189 Other specified counseling: Secondary | ICD-10-CM

## 2017-11-25 DIAGNOSIS — C3490 Malignant neoplasm of unspecified part of unspecified bronchus or lung: Secondary | ICD-10-CM

## 2017-11-25 LAB — BASIC METABOLIC PANEL
ANION GAP: 11 (ref 5–15)
Anion gap: 12 (ref 5–15)
BUN: 34 mg/dL — AB (ref 6–20)
BUN: 35 mg/dL — ABNORMAL HIGH (ref 6–20)
CALCIUM: 8.2 mg/dL — AB (ref 8.9–10.3)
CHLORIDE: 81 mmol/L — AB (ref 101–111)
CO2: 32 mmol/L (ref 22–32)
CO2: 31 mmol/L (ref 22–32)
CREATININE: 1.35 mg/dL — AB (ref 0.61–1.24)
Calcium: 8.2 mg/dL — ABNORMAL LOW (ref 8.9–10.3)
Chloride: 81 mmol/L — ABNORMAL LOW (ref 101–111)
Creatinine, Ser: 1.26 mg/dL — ABNORMAL HIGH (ref 0.61–1.24)
GFR calc non Af Amer: 51 mL/min — ABNORMAL LOW (ref >60)
GFR, EST AFRICAN AMERICAN: 60 mL/min — AB (ref >60)
GFR, EST AFRICAN AMERICAN: 55 mL/min — AB (ref >60)
GFR, EST NON AFRICAN AMERICAN: 47 mL/min — AB (ref >60)
Glucose, Bld: 88 mg/dL (ref 65–99)
Glucose, Bld: 111 mg/dL — ABNORMAL HIGH (ref 65–99)
Potassium: 3.8 mmol/L (ref 3.5–5.1)
Potassium: 4 mmol/L (ref 3.5–5.1)
SODIUM: 124 mmol/L — AB (ref 135–145)
SODIUM: 124 mmol/L — AB (ref 135–145)

## 2017-11-25 LAB — CBC
HEMATOCRIT: 32.6 % — AB (ref 39.0–52.0)
HEMOGLOBIN: 10.7 g/dL — AB (ref 13.0–17.0)
MCH: 29.1 pg (ref 26.0–34.0)
MCHC: 32.8 g/dL (ref 30.0–36.0)
MCV: 88.6 fL (ref 78.0–100.0)
Platelets: 203 10*3/uL (ref 150–400)
RBC: 3.68 MIL/uL — AB (ref 4.22–5.81)
RDW: 15.6 % — ABNORMAL HIGH (ref 11.5–15.5)
WBC: 11.5 10*3/uL — AB (ref 4.0–10.5)

## 2017-11-25 MED ORDER — POTASSIUM CHLORIDE CRYS ER 20 MEQ PO TBCR
40.0000 meq | EXTENDED_RELEASE_TABLET | Freq: Once | ORAL | Status: AC
  Administered 2017-11-25: 40 meq via ORAL
  Filled 2017-11-25: qty 2

## 2017-11-25 MED ORDER — IPRATROPIUM-ALBUTEROL 0.5-2.5 (3) MG/3ML IN SOLN: 3.0000 mL | Freq: Four times a day (QID) | RESPIRATORY_TRACT | Status: DC

## 2017-11-25 MED ORDER — IPRATROPIUM-ALBUTEROL 0.5-2.5 (3) MG/3ML IN SOLN
3.0000 mL | Freq: Four times a day (QID) | RESPIRATORY_TRACT | Status: DC
  Administered 2017-11-25 (×2): 3 mL via RESPIRATORY_TRACT
  Filled 2017-11-25 (×3): qty 3

## 2017-11-25 MED ORDER — HYDROCODONE-ACETAMINOPHEN 7.5-325 MG PO TABS
1.0000 | ORAL_TABLET | Freq: Four times a day (QID) | ORAL | Status: DC | PRN
  Administered 2017-11-26 – 2017-11-28 (×4): 1 via ORAL
  Filled 2017-11-25 (×4): qty 1

## 2017-11-25 MED ORDER — FUROSEMIDE 10 MG/ML IJ SOLN
80.0000 mg | Freq: Two times a day (BID) | INTRAMUSCULAR | Status: DC
  Administered 2017-11-25 – 2017-11-29 (×8): 80 mg via INTRAVENOUS
  Filled 2017-11-25 (×8): qty 8

## 2017-11-25 MED ORDER — ALBUTEROL SULFATE (2.5 MG/3ML) 0.083% IN NEBU: 2.5000 mg | INHALATION_SOLUTION | RESPIRATORY_TRACT | Status: DC | PRN

## 2017-11-25 NOTE — Consult Note (Signed)
Cardiology Consultation:   Patient ID: Harry Hinton; [id number]; 1932-12-26   Admit date: 11/24/2017 Date of Consult: 11/25/2017  Primary Care Provider: Myrlene Broker, MD Primary Cardiologist: Harry Grooms, MD    Patient Profile:   Harry Hinton is a 84 y.o. male with non-small cell lung CA with metastases to the liver, being treated with chemo and radiation, HTN, tobacco abuse and combined systolic and diastolic CHF w/ recent hospital admission 2/6-2/10/19 for acute CHF and noted to have new LV dysfunction (EF 35-40%, 2DD), treated with diuretics. 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 Pt was discharged 2/10 and presented back to the ED night of 2/12 with recurrent dyspnea and LEE and readmitted by IM for recurrent acute CHF. Cardiology consulted to assist with HF management, at the request of Harry Hinton, Internal Medicine.  History of Present Illness:   [name] lung cancer is followed by Dr. Marin Hinton and recently completed chemo and radiation.   Previous hospitalization 2/6-2/10/19 for CHF, admit BNP was >1600. He was treated with IV lasix and noted to have good UOP. His weight day of d/c was 168 lb (readmisssion weight 2/12 171 lb). Previous admit, echo was also obtained and showed moderately reduced LVEF, down to 35-40%. It was felt that CAD was probable, however w/u was deferred due to comorbid conditions. Medical management recommend, however was limited due to soft BP. He was discharged home on a low dose BB, carvedilol 3.125 mg BID, with plans to try to titrate HF regimen with further increase in BB and addition of RAAS inhibitors in clinic. He was also discharged home on PO lasix 40 mg BID. Also discharged with supplemental O2, 2L/min [place]. Discharge labs: Scr 1.11, BUN 30, K 4.0, Na 126, Hgb 11.5. Weight 168 lb.   Pt was discharged home with home health RN. It was noted that pt had recurrent dyspnea and started to redevelop LEE shortly after  return home. HHRN also noted presence of pulmonary crackles on exam. Pt's PCP was notified who then instructed pt return to the ED.   In ED, repeat BNP obtained, which remains elevated but much lower from previous, down from 1,699>> to 810. Renal function is stable. SCr 1.26. Sodium remains low at 124. Admit weight up 3 lb from previous discharge weight, 171 lb. CXR showed fibrosis, likely due to radiation therapy change, on the left. Small left pleural effusion with atelectasis in the left base. There is also mild right base atelectasis. No new opacity.  Pt has been readmitted by IM and placed back on IV Lasix, 40 mg BID. 450 cc UOP documented in chart, but additional urine in foley bag. Weight is down from 171 to 169 lb today. Pt notes he is breathing better. Family by bedside.  Pt tells me that he was fully compliant with Lasix BID when he returned home and had frequent urination, in fact incontinence (had to use an adult pamper). He hasn't been eating much but has been drinking a low of water, > 1 L a day.    Past Medical History:  Diagnosis Date  . Adenocarcinoma of lung metastatic to liver, right (Harry Hinton) 11/13/2017  . AKI (acute kidney injury) ([place]) 03/10/2017   NOTED ON ECHO  . Anemia    history of  . Aortic valve sclerosis 03/09/2017   NOTED ON ECHO  . Bronchitis   . Cancer ([place])     left lung CA  . Counseling regarding goals of care 03/26/2017  .  Dyspnea   . GERD (gastroesophageal reflux disease)   . Hearing loss   . History of blood transfusion   . Hypertension   . Lung cancer, primary, with metastasis from lung to other site, right ([place]) 11/13/2017  . LVH (left ventricular hypertrophy) 03/10/2017   mild, NOTED ON ECHO  . MR (mitral regurgitation)    trivial, NOTED ON ECHO  . Pneumonia   . Port-A-Cath in place   . Sinus problem   . Tingling    right foot    Past Surgical History:  Procedure Laterality Date  . COLONOSCOPY    . EYE SURGERY     bilateral cataracts in  approx. 2016  . IR FLUORO GUIDE PORT INSERTION RIGHT  04/06/2017  . IR RADIOLOGIST EVAL & MGMT  09/30/2017  . IR US GUIDE VASC ACCESS RIGHT  04/06/2017  . LIVER BIOPSY    . RADIOLOGY WITH ANESTHESIA N/A 11/06/2017   Procedure: CT MICROWAVE THERMAL ABLATION - LIVER;  Surgeon: Aletta Edouard, MD;  Location: [facility];  Service: Radiology;  Laterality: N/A;  . TONSILLECTOMY     age 27  . VASECTOMY    . VIDEO BRONCHOSCOPY WITH ENDOBRONCHIAL ULTRASOUND  03/11/2017   Procedure: VIDEO BRONCHOSCOPY WITH ENDOBRONCHIAL ULTRASOUND;  Surgeon: Collene Gobble, MD;  Location: [facility];  Service: Thoracic;;     Home Medications:  Prior to Admission medications   Medication Sig Start Date End Date Taking? Authorizing Provider  albuterol (PROVENTIL HFA;VENTOLIN HFA) 108 (90 Base) MCG/ACT inhaler Inhale 2 puffs into the lungs every 6 (six) hours as needed for wheezing or shortness of breath. 11/22/17 02/20/18 Yes Katherine Roan, MD  albuterol (PROVENTIL) (2.5 MG/3ML) 0.083% nebulizer solution Take 3 mLs (2.5 mg total) by nebulization every 6 (six) hours as needed for wheezing or shortness of breath. 11/22/17  Yes Katherine Roan, MD  aspirin EC 81 MG tablet Take 81 mg by mouth at bedtime.    Yes [provider]  carvedilol (COREG) 3.125 MG tablet Take 1 tablet (3.125 mg total) by mouth 2 (two) times daily with a meal. 03/12/17  Yes Mikhail, [place], DO  dronabinol (MARINOL) 2.5 MG capsule Take 1 capsule (2.5 mg total) by mouth 2 (two) times daily before lunch and supper. 11/13/17  Yes Volanda Napoleon, MD  furosemide (LASIX) 40 MG tablet Take 1 tablet (40 mg total) by mouth 2 (two) times daily. 11/22/17  Yes Katherine Roan, MD  Multiple Vitamin (MULTIVITAMIN WITH MINERALS) TABS tablet Take 1 tablet by mouth daily. Centrum Silver   Yes [provider]  prochlorperazine (COMPAZINE) 10 MG tablet Take 1 tablet (10 mg total) by mouth every 6 (six) hours as needed (Nausea or vomiting). 04/06/17 11/13/17   Volanda Napoleon, MD    Inpatient Medications: Scheduled Meds: . aspirin EC  81 mg Oral QHS  . carvedilol  3.125 mg Oral BID WC  . dronabinol  2.5 mg Oral BID AC  . enoxaparin (LOVENOX) injection  40 mg Subcutaneous Q24H  . furosemide  40 mg Intravenous BID  . ipratropium-albuterol  3 mL Nebulization Q6H  . multivitamin with minerals  1 tablet Oral Daily  . potassium chloride  40 mEq Oral Once  . sodium chloride flush  10-40 mL Intracatheter Q12H   Continuous Infusions:  PRN Meds: acetaminophen **OR** acetaminophen, albuterol, HYDROcodone-acetaminophen, polyethylene glycol, sodium chloride flush  Allergies:    Allergies  Allergen Reactions  . Chlorhexidine Itching and Rash    Social History:  Social History   Socioeconomic History  . Marital status: Married    Spouse name: Not on file  . Number of children: Not on file  . Years of education: Not on file  . Highest education level: Not on file  Social Needs  . Financial resource strain: Not on file  . Food insecurity - worry: Not on file  . Food insecurity - inability: Not on file  . Transportation needs - medical: Not on file  . Transportation needs - non-medical: Not on file  Occupational History  . Not on file  Tobacco Use  . Smoking status: Former Smoker    Types: Cigarettes    Last attempt to quit: 03/10/1987    Years since quitting: 30.7  . Smokeless tobacco: Never Used  Substance and Sexual Activity  . Alcohol use: Yes    Comment: occasional beer or occasional wine  . Drug use: No  . Sexual activity: Not on file  Other Topics Concern  . Not on file  Social History Narrative  . Not on file    Family History:    Family History  Problem Relation Age of Onset  . Heart attack Father 20  . Congestive Heart Failure Father      ROS:  Please see the history of present illness.   All other ROS reviewed and negative.     Physical Exam/Data:   Vitals:   11/24/17 1837 11/25/17 0028 11/25/17 0454  11/25/17 1300  BP: (!) 167/96 (!) 131/55 125/75 105/72  Pulse: 100 89 91 87  Resp: (!) 21 20 18 18   Temp: (!) 97.3 F (36.3 C) (!) 97.5 F (36.4 C) 97.7 F (36.5 C) 97.7 F (36.5 C)  TempSrc: Oral Oral Oral Oral  SpO2: 91% 97% 96% 99%  Weight: 171 lb 8 oz (77.8 kg)  169 lb (76.7 kg)   Height: 6' (1.829 m)       Intake/Output Summary (Last 24 hours) at 11/25/2017 1432 Last data filed at 11/25/2017 0900 Gross per 24 hour  Intake 10 ml  Output 651 ml  Net -641 ml   Filed Weights   11/24/17 1837 11/25/17 0454  Weight: 171 lb 8 oz (77.8 kg) 169 lb (76.7 kg)   Body mass index is 22.92 kg/m.  General:  Well nourished, well developed, in no acute distress HEENT: normal Lymph: no adenopathy Neck: no JVD Endocrine:  No thryomegaly Vascular: No carotid bruits; FA pulses 2+ bilaterally without bruits  Cardiac:  normal S1, S2; RRR; no murmur Lungs:  Bibasilar crackles Abd: soft, nontender, no hepatomegaly  Ext: 2+ bilateral pretibial edema Musculoskeletal:  No deformities, BUE and BLE strength normal and equal Skin: warm and dry  Neuro:  CNs 2-12 intact, no focal abnormalities noted Psych:  Normal affect   EKG:  The EKG was personally reviewed and demonstrates:  SR w/ PVC Telemetry:  Telemetry was personally reviewed and demonstrates:  NSR  Relevant CV Studies: 2D Echo 11/18/17  Study Conclusions  - Left ventricle: The cavity size was normal. Wall thickness was   normal. Systolic function was moderately reduced. The estimated   ejection fraction was in the range of 35% to 40%. Features are   consistent with a pseudonormal left ventricular filling pattern,   with concomitant abnormal relaxation and increased filling   pressure (grade 2 diastolic dysfunction). - Aortic valve: Mildly to moderately calcified annulus. Moderately   thickened, moderately calcified leaflets. There was mild   regurgitation. - Mitral valve: Mildly calcified annulus. -  Right ventricle: The cavity  size was mildly dilated. Systolic   function was mildly reduced. - Pulmonary arteries: Systolic pressure was moderately increased.   PA peak pressure: 45 mm Hg (S). Laboratory Data:  Chemistry Recent Labs  Lab 11/22/17 0406 11/24/17 1341 11/25/17 0417  NA 126* 123* 124*  K 3.9 4.1 3.8  CL 83* 80* 81*  CO2 31 32 32  GLUCOSE 106* 112* 88  BUN 42* 33* 34*  CREATININE 1.43* 1.02 1.26*  CALCIUM 7.8* 8.1* 8.2*  GFRNONAA 44* >60 51*  GFRAA 51* >60 60*  ANIONGAP 12 11 11     Recent Labs  Lab 11/24/17 1341  PROT 6.1*  ALBUMIN 2.4*  AST 27  ALT 31  ALKPHOS 105  BILITOT 0.8   Hematology Recent Labs  Lab 11/19/17 0414 11/24/17 1341 11/25/17 0417  WBC 12.3* 11.7* 11.5*  RBC 3.91* 3.82* 3.68*  HGB 11.5* 11.3* 10.7*  HCT 34.3* 33.6* 32.6*  MCV 87.7 88.0 88.6  MCH 29.4 29.6 29.1  MCHC 33.5 33.6 32.8  RDW 15.9* 15.4 15.6*  PLT 232 196 203   Cardiac Enzymes Recent Labs  Lab 11/18/17 1926 11/19/17 0203  TROPONINI 0.61* 0.48*    Recent Labs  Lab 11/24/17 1353  TROPIPOC 0.00    BNP Recent Labs  Lab 11/24/17 1341  BNP 810.6*    DDimer No results for input(s): DDIMER in the last 168 hours.  Radiology/Studies:  Dg Chest Port 1 View  Result Date: 11/24/2017 CLINICAL DATA:  Shortness of breath. Hypertension. History of left-sided lung carcinoma EXAM: PORTABLE CHEST 1 VIEW COMPARISON:  November 18, 2017 FINDINGS: There is fibrosis in the left mid lower lung zones medially. There is a small left pleural effusion with atelectatic change in both lung bases. No well-defined mass. Heart size is normal. The pulmonary vascularity appears normal. No adenopathy. Port-A-Cath tip is in the right atrium with the [facility] needle inserted into the port. There is a presumed calcified lymph node in the right axilla, stable. Bones are osteoporotic. IMPRESSION: Fibrosis, likely due to radiation therapy change, on the left. Small left pleural effusion with atelectasis in the left base. There is  also mild right base atelectasis. No new opacity. Stable cardiac silhouette. Port-A-Cath tip in right atrium. Bones osteoporotic. Electronically Signed   By: Lowella Grip III M.D.   On: 11/24/2017 13:42    Assessment and Plan:    Yarden Hillis is a 84 y.o. male with non-small cell lung CA with metastases to the liver, being treated with chemo and radiation, HTN, tobacco abuse and combined systolic and diastolic CHF w/ recent hospital admission 2/6-2/10/19 for acute CHF and noted to have new LV dysfunction (EF 35-40%, 2DD), treated with diuretics. 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 Pt was discharged 2/10 and presented back to the ED night of 2/12 with recurrent dyspnea and LEE and readmitted by IM for recurrent acute CHF. Cardiology consulted to assist with HF management, at the request of Harry Hinton, Internal Medicine.   1. Acute on Chronic Combined Systolic and Diastolic CHF: EF 85-46% w/ G2DD on recent echo. He is volume overloaded with 2+ bilateral pretibial edema and bibasilar crackles on exam. Pt notes sudden recurrence of dyspnea and quick weight gain from 168>>172 lb <48 hrs post discharge, despite full compliance with 40 mg PO Lasix BID. He is feeling better after being restarted on IV Lasix. Weight is down from 171 lb to 168 lb today. Renal function and BP stable.  Continue IV Lasix for diuresis until evuolemic. Monitor sodium. May need higher dose of PO Lasix once he returns home. We discussed sliding scale dose adjustments with lasix based on daily home weights. Restrict free water intake.   2. Cardiomyopathy: newly diagnosed. Echo previous admit on 2/6 showed moderately reduced EF, down to 35-40%. Per IM, none of his chemo meds are cardiotoxic. Etiology uncertain, CAD probable, but workup deferred due to comorbid conditions. Will treat medically. Continue low dose Coreg. Little room in BP to add ACE/ARB.   3. NSCLC: followed by Dr. Marin Hinton. Treated  with chemo + radiation.   4. Hyponatremia: 124 today. Felt largely SIADH. Need to fluid restrict.   For questions or updates, please contact [facility] Please consult [email or web address] for contact info under Cardiology/STEMI.   Signed, Lyda Jester, PA-C  11/25/2017 2:32 PM

## 2017-11-25 NOTE — H&P (Signed)
Internal Medicine Attending Admission Note Date: 11/25/2017  Patient name: Harry Hinton Medical record number: [id number] Date of birth: Apr 02, 1933 Age: 84 y.o. Gender: male  I saw and evaluated the patient. I reviewed the resident's note and I agree with the resident's findings and plan as documented in the resident's note.  Chief Complaint(s): Progressive dyspnea on exertion 2 days.  History - key components related to admission:  Harry Hinton is an 84 year old man with a history of left lower lobe adenocarcinoma of the lung initially staged at IIIB and treated with radiation and carboplatin/etoposide now metastatic to the liver, hypertension, and gastroesophageal reflux disease who was recently discharged from the internal medicine teaching service after being diagnosed with acute on chronic combined systolic and diastolic heart failure that responded well to intravenous loop diuresis. He initially did well at home maintaining compliance with his Lasix 40 mg orally twice daily and salt restriction. Unfortunately, he drank a considerable amount of fluid and much more than his 1.5 L fluid restriction. His weight upon arrival to home after discharge was 168 pounds and over the next 24 hours his weight increased to 172 pounds. His primary care provider was called with this information and the patient was advised to return to the hospital. He was admitted to the internal medicine teaching service for recurrence of his acute on chronic combined systolic and diastolic heart failure.  He was diuresed overnight and when seen on rounds the morning after admission he noted some improvement in his dyspnea.  Physical Exam - key components related to admission:  Vitals:   11/24/17 1837 11/25/17 0028 11/25/17 0454 11/25/17 1300  BP: (!) 167/96 (!) 131/55 125/75 105/72  Pulse: 100 89 91 87  Resp: (!) 21 20 18 18   Temp: (!) 97.3 F (36.3 C) (!) 97.5 F (36.4 C) 97.7 F (36.5 C) 97.7 F (36.5 C)  TempSrc:  Oral Oral Oral Oral  SpO2: 91% 97% 96% 99%  Weight: 171 lb 8 oz (77.8 kg)  169 lb (76.7 kg)   Height: 6' (1.829 m)      Gen.: Well-developed, well-nourished, chronically ill-appearing man sitting comfortably in a recliner in no acute distress. Neck: Unable to appreciate jugular venous distention or hepatojugular reflux Lungs: Bibasilar inspiratory crackles improved from the previous admission. He also had bilateral expiratory wheezes in all lung fields. The expiratory phase was mildly prolonged. Heart: Distant heart sounds, regular rate and rhythm. Abdomen: Soft, nontender, without guarding or rebound. Extremities: Pitting edema bilaterally in the midshin  Lab results:  Basic Metabolic Panel: Recent Labs    11/24/17 1341 11/25/17 0417  NA 123* 124*  K 4.1 3.8  CL 80* 81*  CO2 32 32  GLUCOSE 112* 88  BUN 33* 34*  CREATININE 1.02 1.26*  CALCIUM 8.1* 8.2*   Liver Function Tests: Recent Labs    11/24/17 1341  AST 27  ALT 31  ALKPHOS 105  BILITOT 0.8  PROT 6.1*  ALBUMIN 2.4*   CBC: Recent Labs    11/24/17 1341 11/25/17 0417  WBC 11.7* 11.5*  NEUTROABS 9.8*  --   HGB 11.3* 10.7*  HCT 33.6* 32.6*  MCV 88.0 88.6  PLT 196 203   Misc. Labs:  BNP 810.6 (1,669.6)  Imaging results:  Dg Chest Port 1 View  Result Date: 11/24/2017 CLINICAL DATA:  Shortness of breath. Hypertension. History of left-sided lung carcinoma EXAM: PORTABLE CHEST 1 VIEW COMPARISON:  November 18, 2017 FINDINGS: There is fibrosis in the left mid lower lung zones medially.  There is a small left pleural effusion with atelectatic change in both lung bases. No well-defined mass. Heart size is normal. The pulmonary vascularity appears normal. No adenopathy. Port-A-Cath tip is in the right atrium with the [facility] needle inserted into the port. There is a presumed calcified lymph node in the right axilla, stable. Bones are osteoporotic. IMPRESSION: Fibrosis, likely due to radiation therapy change, on the left.  Small left pleural effusion with atelectasis in the left base. There is also mild right base atelectasis. No new opacity. Stable cardiac silhouette. Port-A-Cath tip in right atrium. Bones osteoporotic. Electronically Signed   By: Lowella Grip III M.D.   On: 11/24/2017 13:42   AP portable chest x-ray: Personally reviewed. Small left-sided effusion with associated increased interstitial pattern. These findings are slightly worsened compared to the previous AP portable chest x-ray from 11/18/2017.  Assessment & Plan by Problem:  Harry Hinton is an 84 year old man with a history of left lower lobe adenocarcinoma of the lung initially staged at IIIB and treated with radiation and carboplatin/etoposide now metastatic to the liver, hypertension, and gastroesophageal reflux disease who was recently discharged from the internal medicine teaching service after being diagnosed with acute on chronic combined systolic and diastolic heart failure that responded well to intravenous loop diuresis. Since returning home he's been compliant with his oral Lasix and sodium restriction, but had drank an excessive amount of fluid. He again presented with evidence of heart failure but has been diuresed, yet continues to have symptoms of dyspnea. I remain concerned that this is more than just heart failure and may actually also included component of radiation pneumonitis or even postobstructive atelectasis. Unfortunately, what little is available for radiation pneumonitis, specifically steroids, may actually cause more harm than good because of the sodium retention in the setting of the acute on chronic combined systolic and diastolic heart failure. I believe Harry Hinton will need to walk a very fine line with regards to volume status and kidney function/blood pressure. There are things we can do including adjusting the oral diuretic dose and stressing the importance of a fluid restriction. He may also benefit from advance heart  failure home monitoring with tools for self-management. That said, my biggest concern is that he is now nearing the end stages of his oncologic disease and the subsequent sequela including the heart failure.  1) Acute on chronic combined systolic and diastolic heart failure: We will continue with the IV diuresis for 1 more day, but I suspect his renal function may bump tomorrow limiting any further aggressive diuresis. We have educated he and his wife extensively on the importance of the fluid restriction. We appreciate cardiology's input. At this point, I believe the most important discussion may come from palliative care. I think the family could benefit greatly from initiating this discussion and reflecting upon symptom management. This may also make the transition to hospice care much easier.  2) Disposition: Pending diuresis, as much as possible, as well as a discussion on palliative care and symptom management.
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
## 2017-11-25 NOTE — Plan of Care (Signed)
Patient displayed full understanding of current circumstances and the treatment plan.

## 2017-11-25 NOTE — Progress Notes (Signed)
Internal Medicine Attending  Date: 11/25/2017  Patient name: Jaun Galluzzo Medical record number: [id number] Date of birth: 1932-12-11 Age: 84 y.o. Gender: male  I saw and evaluated the patient. I reviewed the resident's note by Dr. Shan Levans and I agree with the resident's findings and plans as documented in his progress note.  Please see my H&P dated 11/25/2017 for the specifics of my evaluation, assessment, and plan from earlier in the day.

## 2017-11-25 NOTE — Progress Notes (Signed)
   Subjective: Patient has noticed some improvement in his shortness of breath with diuresis.  In talking with the family he was drinking quite a bit of fluid at home well over his fluid restriction.    Objective:  Vital signs in last 24 hours: Vitals:   11/24/17 1800 11/24/17 1837 11/25/17 0028 11/25/17 0454  BP: 120/83 (!) 167/96 (!) 131/55 125/75  Pulse: 90 100 89 91  Resp: (!) 21 (!) 21 20 18   Temp:  (!) 97.3 F (36.3 C) (!) 97.5 F (36.4 C) 97.7 F (36.5 C)  TempSrc:  Oral Oral Oral  SpO2: 99% 91% 97% 96%  Weight:  171 lb 8 oz (77.8 kg)  169 lb (76.7 kg)  Height:  6' (1.829 m)     Physical Exam  Constitutional: He is oriented to person, place, and time. He appears well-developed and well-nourished.  Eyes: Right eye exhibits no discharge. Left eye exhibits no discharge. No scleral icterus.  Neck: No JVD present.  Cardiovascular: Normal rate, normal heart sounds and intact distal pulses. Exam reveals no gallop and no friction rub.  No murmur heard. Sinus rhythm with frequent PACs  Pulmonary/Chest: Effort normal. No respiratory distress. He has decreased breath sounds in the right lower field. He has wheezes in the right middle field, the right lower field, the left middle field and the left lower field. He has rales in the left lower field.  Abdominal: Soft. Bowel sounds are normal. He exhibits no distension and no mass. There is no tenderness. There is no guarding.  Musculoskeletal:       Right lower leg: He exhibits edema.       Left lower leg: He exhibits edema.  Neurological: He is alert and oriented to person, place, and time.   Reviewed EKG   Sinus rhythm with PAC's  Assessment/Plan:  Active Problems:   Acute on chronic heart failure (HCC)  Acute on chronic HFrEF with hypoxemia Increased SOB and weight gain at home, not following dietary/fluid restrictions, not improved with home nebulizer therapy  -lower extremity edema, rales this am but not appreciating much  if any JVD -continue IV lasix 40mg  BID today, creatinine already starting to increase again.  I'm not sure how much further diuresis will continue to improve his situation without damaging his kidneys.  Low albumin and inflammation of lung parenchyma from radiation could be playing a role.    -Pt also has underlying lung adenocarcinoma, permanent damage and inflammation from radiation and paraseptal emphysema from smoking contributing to his shortness of breath -pt has very low albumin and likely SIADH complicating matters further -fluid restriction, strict I's and O's, daily weights  -consulted advanced heart failure team -continue low dose carvedilol, bp will likely not tolerate an ARB as on last admission   Hyponatremia 2/2 hypervolemia w/ HFrEF and likely SIADH contributing as well  -continue diuresis, fluid restriction -may need more salt not less but would be counterproductive to patient's heart failure   Paraseptal emphysema  -continue scheduled duoneb therapy every 6 hours  Metastatic Lung Adenocarcinoma  -will follow up with oncology, likely can not tolerate any further chemo or radiation -Palliative care consulted to help with symptom management and introduce the idea of hospice.     Dispo: Anticipated discharge in approximately 2-3day(s).   Katherine Roan, MD 11/25/2017, 11:02 AM Vickki Muff MD PGY-1 Internal Medicine Pager # [phone]

## 2017-11-26 DIAGNOSIS — Z9221 Personal history of antineoplastic chemotherapy: Secondary | ICD-10-CM

## 2017-11-26 DIAGNOSIS — N289 Disorder of kidney and ureter, unspecified: Secondary | ICD-10-CM

## 2017-11-26 DIAGNOSIS — I429 Cardiomyopathy, unspecified: Secondary | ICD-10-CM

## 2017-11-26 LAB — BASIC METABOLIC PANEL
ANION GAP: 11 (ref 5–15)
BUN: 34 mg/dL — ABNORMAL HIGH (ref 6–20)
CALCIUM: 8.1 mg/dL — AB (ref 8.9–10.3)
CO2: 31 mmol/L (ref 22–32)
CREATININE: 1.42 mg/dL — AB (ref 0.61–1.24)
Chloride: 85 mmol/L — ABNORMAL LOW (ref 101–111)
GFR calc Af Amer: 52 mL/min — ABNORMAL LOW (ref >60)
GFR calc non Af Amer: 44 mL/min — ABNORMAL LOW (ref >60)
Glucose, Bld: 96 mg/dL (ref 65–99)
Potassium: 4.1 mmol/L (ref 3.5–5.1)
Sodium: 127 mmol/L — ABNORMAL LOW (ref 135–145)

## 2017-11-26 LAB — INFLUENZA PANEL BY PCR (TYPE A & B)
Influenza A By PCR: NEGATIVE
Influenza B By PCR: NEGATIVE

## 2017-11-26 MED ORDER — IPRATROPIUM-ALBUTEROL 0.5-2.5 (3) MG/3ML IN SOLN: 3.0000 mL | RESPIRATORY_TRACT | Status: DC | PRN

## 2017-11-26 MED ORDER — IPRATROPIUM-ALBUTEROL 0.5-2.5 (3) MG/3ML IN SOLN
3.0000 mL | Freq: Three times a day (TID) | RESPIRATORY_TRACT | Status: DC
  Administered 2017-11-26 (×2): 3 mL via RESPIRATORY_TRACT
  Filled 2017-11-26 (×3): qty 3

## 2017-11-26 NOTE — Progress Notes (Signed)
Internal Medicine Attending  Date: 11/26/2017  Patient name: Harry Hinton Medical record number: [id number] Date of birth: 18-Apr-1933 Age: 84 y.o. Gender: male  I saw and evaluated the patient. I reviewed the resident's note by Dr. Shan Levans and I agree with the resident's findings and plans as documented in his progress note.  When seen on rounds this morning [name] noted a subjective improvement in his dyspnea. That said, he has not exerted himself and I am quite concerned he would become easily dyspneic with minimal exertion. Although he presented with some heart failure, we are getting close to his dry weight I suspect. I appreciate cardiology's input and we will give another day of IV Lasix understanding that comfort at this point is much more important than the creatinine. He also had some obstruction on exam when admitted and we have been providing him with bronchodilators in hopes of improving that component of dyspnea. His flu studies were negative today so that is not something that we have available to treat. Finally, the issue of a radiation pneumonitis still looms over his symptom complex. The treatment would be prednisone 40 mg by mouth daily for 2 weeks with a taper thereafter. We have been hesitant to start this therapy back as we believe that initiated his original CHF exacerbation. That said, given his respiratory status at this point with what we believe is near close to dry weight, more symptomatic relief is required. Since radiation pneumonitis may be playing a role in his symptoms, the idea of introducing the prednisone at this time has been raised. We realize, it will require an adjustment in his maintenance diuretics to overcome the salt and water retaining effects of the prednisone, but it may afford him some symptomatic relief in addition to the diuresis. He and his wife were very hesitant to restart the prednisone and would like his oncologist to weigh in on the issue. In the  meantime, we will continue with current supportive care as noted above, but my concern is that his prognosis is quite poor at this point.

## 2017-11-26 NOTE — Care Management Note (Signed)
Case Management Note  Patient Details  Name: Harry Hinton MRN: [id number] Date of Birth: 04-16-1933  Subjective/Objective:    CHF             Action/Plan: 11/26/2016 - Patient known to me from previous admission; CM will continue to follow for progression of care. Mindi Slicker [facility]  11/22/2017 - Action/Plan: PTA pt lived at home with wife- referral for [facility] and DME needs including home 35- spoke with MD- for order needs- discussed transition needs with pt and wife at the bedside- choice offered for [facility] agency- per pt and wife- ok with using [facility] for DME and HH needs- discussed DME needs- pt would like rollator for home, and also discussed nebulizer with MD. Pt has also qualified  for home 02. Orders have been placed for HHRN/PT- call placed to [facility] with [facility] for [facility] and DME needs- portable 02 tank, nebulizer and rollator will be delivered to room prior to discharge. Kenn File RN,CM  Expected Discharge Date:    possibly 11/30/2017              Expected Discharge Plan:   Home with [place] services  Status of Service:   In progress  Sherrilyn Rist [phone] 11/26/2017, 12:39 PM

## 2017-11-26 NOTE — Progress Notes (Signed)
   Subjective: Patient only complaining of mild shortness of breath at rest.   He feels like if he tries to exert himself it would be a different story.  He denies any chest pain, nausea or vomiting, still has a poor appetite.    Objective:  Vital signs in last 24 hours: Vitals:   11/25/17 1300 11/25/17 1957 11/25/17 2016 11/26/17 0442  BP: 105/72 139/78  118/61  Pulse: 87 85 84 81  Resp: 18 18 18 18   Temp: 97.7 F (36.5 C) (!) 97.3 F (36.3 C)  (!) 97.3 F (36.3 C)  TempSrc: Oral Oral  Oral  SpO2: 99% 95% 95% 96%  Weight:    170 lb (77.1 kg)  Height:       Physical Exam  Constitutional: He is oriented to person, place, and time. He appears well-developed and well-nourished.  Eyes: Right eye exhibits no discharge. Left eye exhibits no discharge. No scleral icterus.  Neck: No JVD present.  Cardiovascular: Normal rate, normal heart sounds and intact distal pulses. Exam reveals no gallop and no friction rub.  No murmur heard. Sinus rhythm with frequent PACs  Pulmonary/Chest: Effort normal. No respiratory distress. He has no decreased breath sounds. He has no wheezes. He has rales in the right lower field and the left lower field.  Abdominal: Soft. Bowel sounds are normal. He exhibits no distension and no mass. There is no tenderness. There is no guarding.  Musculoskeletal:       Right lower leg: He exhibits edema.       Left lower leg: He exhibits edema.  Neurological: He is alert and oriented to person, place, and time.   BMP Latest Ref Rng & Units 11/26/2017 11/25/2017 11/25/2017  Glucose 65 - 99 mg/dL 96 111(H) 88  BUN 6 - 20 mg/dL 34(H) 35(H) 34(H)  Creatinine 0.61 - 1.24 mg/dL 1.42(H) 1.35(H) 1.26(H)  Sodium 135 - 145 mmol/L 127(L) 124(L) 124(L)  Potassium 3.5 - 5.1 mmol/L 4.1 4.0 3.8  Chloride 101 - 111 mmol/L 85(L) 81(L) 81(L)  CO2 22 - 32 mmol/L 31 31 32  Calcium 8.9 - 10.3 mg/dL 8.1(L) 8.2(L) 8.2(L)    Assessment/Plan:   Acute on chronic HFrEF with  hypoxemia Increased SOB and weight gain at home, not following dietary/fluid restrictions, not improved with home nebulizer therapy  -no jvd, mild LE edema -existing lung disease, cancer and damage from chemo/ radiation also contributing to shortness of breath -creatinine and BUN increasing, will get one more day of IV lasix 80mg  BID  --fluid restriction, strict I's and O's, daily weights  -cardiology consulted appreciate recs -continue low dose carvedilol, bp will likely not tolerate an ARB as on last admission   Hyponatremia 2/2 hypervolemia w/ HFrEF and likely SIADH contributing as well  -continue diuresis, fluid restriction -may need more salt not less but may be counterproductive to patient's heart failure   Paraseptal emphysema  -continue scheduled duoneb therapy every 6 hours  Metastatic Lung Adenocarcinoma  -talked with Dr. Marin Olp, the patient's oncologist today, he will go by and see the patient -likely will be difficult to continue chemo and radiation -Palliative care consulted are helping with symptom management and introduced the idea of hospice.     Dispo: Anticipated discharge in approximately 2-3day(s).   Katherine Roan, MD 11/26/2017, 8:28 AM Vickki Muff MD PGY-1 Internal Medicine Pager # [phone]

## 2017-11-26 NOTE — Progress Notes (Signed)
Progress Note  Patient Name: Harry Hinton Date of Encounter: 11/26/2017  Primary Cardiologist: Larae Grooms, MD   Subjective   Overall he says he is breathing better  Inpatient Medications    Scheduled Meds: . aspirin EC  81 mg Oral QHS  . carvedilol  3.125 mg Oral BID WC  . dronabinol  2.5 mg Oral BID AC  . enoxaparin (LOVENOX) injection  40 mg Subcutaneous Q24H  . furosemide  80 mg Intravenous BID  . ipratropium-albuterol  3 mL Nebulization TID  . multivitamin with minerals  1 tablet Oral Daily  . sodium chloride flush  10-40 mL Intracatheter Q12H   Continuous Infusions:  PRN Meds: acetaminophen **OR** acetaminophen, albuterol, HYDROcodone-acetaminophen, polyethylene glycol, sodium chloride flush   Vital Signs    Vitals:   11/25/17 2016 11/26/17 0442 11/26/17 0835 11/26/17 0844  BP:  118/61 128/78   Pulse: 84 81 95   Resp: 18 18    Temp:  (!) 97.3 F (36.3 C)    TempSrc:  Oral    SpO2: 95% 96%  92%  Weight:  170 lb (77.1 kg)    Height:        Intake/Output Summary (Last 24 hours) at 11/26/2017 0907 Last data filed at 11/26/2017 0505 Gross per 24 hour  Intake 490 ml  Output 1050 ml  Net -560 ml   Filed Weights   11/24/17 1837 11/25/17 0454 11/26/17 0442  Weight: 171 lb 8 oz (77.8 kg) 169 lb (76.7 kg) 170 lb (77.1 kg)    Telemetry    NSR, frequent PACs - Personally Reviewed  ECG    None new - Personally Reviewed  Physical Exam   GEN: Chronically ill appearing male, sitting up in bed, SOB with conversation, on O2 Neck: No JVD Cardiac: RRR, no murmurs, rubs, or gallops.  Respiratory: decreased breath sounds and bilateral wheezing GI: Soft, nontender, non-distended  MS: No edema; No deformity. Neuro:  Nonfocal  Psych: Normal affect   Labs    Chemistry Recent Labs  Lab 11/24/17 1341 11/25/17 0417 11/25/17 1621 11/26/17 0459  NA 123* 124* 124* 127*  K 4.1 3.8 4.0 4.1  CL 80* 81* 81* 85*  CO2 32 32 31 31  GLUCOSE 112* 88 111* 96    BUN 33* 34* 35* 34*  CREATININE 1.02 1.26* 1.35* 1.42*  CALCIUM 8.1* 8.2* 8.2* 8.1*  PROT 6.1*  --   --   --   ALBUMIN 2.4*  --   --   --   AST 27  --   --   --   ALT 31  --   --   --   ALKPHOS 105  --   --   --   BILITOT 0.8  --   --   --   GFRNONAA >60 51* 47* 44*  GFRAA >60 60* 55* 52*  ANIONGAP 11 11 12 11      Hematology Recent Labs  Lab 11/24/17 1341 11/25/17 0417  WBC 11.7* 11.5*  RBC 3.82* 3.68*  HGB 11.3* 10.7*  HCT 33.6* 32.6*  MCV 88.0 88.6  MCH 29.6 29.1  MCHC 33.6 32.8  RDW 15.4 15.6*  PLT 196 203    Cardiac EnzymesNo results for input(s): TROPONINI in the last 168 hours.  Recent Labs  Lab 11/24/17 1353  TROPIPOC 0.00     BNP Recent Labs  Lab 11/24/17 1341  BNP 810.6*     DDimer No results for input(s): DDIMER in the last 168 hours.  Radiology    Dg Chest Port 1 View  Result Date: 11/24/2017 CLINICAL DATA:  Shortness of breath. Hypertension. History of left-sided lung carcinoma EXAM: PORTABLE CHEST 1 VIEW COMPARISON:  November 18, 2017 FINDINGS: There is fibrosis in the left mid lower lung zones medially. There is a small left pleural effusion with atelectatic change in both lung bases. No well-defined mass. Heart size is normal. The pulmonary vascularity appears normal. No adenopathy. Port-A-Cath tip is in the right atrium with the [facility] needle inserted into the port. There is a presumed calcified lymph node in the right axilla, stable. Bones are osteoporotic. IMPRESSION: Fibrosis, likely due to radiation therapy change, on the left. Small left pleural effusion with atelectasis in the left base. There is also mild right base atelectasis. No new opacity. Stable cardiac silhouette. Port-A-Cath tip in right atrium. Bones osteoporotic. Electronically Signed   By: Lowella Grip III M.D.   On: 11/24/2017 13:42    Cardiac Studies   Echo 11/18/17- Study Conclusions  - Left ventricle: The cavity size was normal. Wall thickness was   normal. Systolic  function was moderately reduced. The estimated   ejection fraction was in the range of 35% to 40%. Features are   consistent with a pseudonormal left ventricular filling pattern,   with concomitant abnormal relaxation and increased filling   pressure (grade 2 diastolic dysfunction). - Aortic valve: Mildly to moderately calcified annulus. Moderately   thickened, moderately calcified leaflets. There was mild   regurgitation. - Mitral valve: Mildly calcified annulus. - Right ventricle: The cavity size was mildly dilated. Systolic   function was mildly reduced. - Pulmonary arteries: Systolic pressure was moderately increased.   PA peak pressure: 45 mm Hg (S).  Patient Profile     84 y.o. male with non-small cell lung CA with metastases to the liver, being treated with chemo and radiation, HTN, tobacco abuse and combined systolic and diastolic CHF w/ recent hospital admission 2/6-2/10/19 for acute CHF and noted to have new LV dysfunction (EF 35-40%, 2DD), treated with diuretics. 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 Pt was discharged 2/10 and presented back to the ED night of 2/12 with recurrent dyspnea and LEE and readmitted by IM for recurrent acute CHF.   Assessment & Plan     1. Acute on Chronic Combined Systolic and Diastolic CHF: EF 40-98% w/ G2DD on recent echo. Pt noted sudden recurrence of dyspnea and quick weight gain from 168>>172 lb <48 hrs post discharge, despite full compliance with 40 mg PO Lasix BID. He is feeling better after being restarted on IV Lasix. Weight is down from 171 lb to 170 lb today. Renal function is drifting up-SCr 1.42. Continue IV Lasix for diuresis until evuolemic. Monitor sodium. May need higher dose of PO Lasix once he returns home. We discussed sliding scale dose adjustments with lasix based on daily home weights. Restrict free water intake.   2. Cardiomyopathy: newly diagnosed. Echo previous admit on 2/6 showed  moderately reduced EF, down to 35-40%. Per IM, none of his chemo meds are cardiotoxic. Etiology uncertain, CAD probable, but workup deferred due to comorbid conditions. Will treat medically. Continue low dose Coreg. Little room in BP to add ACE/ARB.   3. NSCLC: followed by Dr. Marin Olp. Treated with chemo + radiation.   4. Hyponatremia: 124 today. Felt largely SIADH. Need to fluid restrict.   Plan: Hesitant to add ARB with bump ion his SCr. Will discuss diuretics with MD-currently on Lasix  80 mg IV BID. A little difficult to tell on exam how much is lung disease and how much is CHF- overall he feels better.   For questions or updates, please contact [place] Please consult [email or web address] for contact info under Cardiology/STEMI.      Signed, Kerin Ransom, PA-C  11/26/2017, 9:07 AM    I have seen and examined the patient along with Kerin Ransom, PA-C .  I have reviewed the chart, notes and new data.  I agree with PA's note.  Key new complaints: Breathing improved rapidly after IV diuretics Key examination changes: No overt signs of hypervolemia on physical exam Key new findings / data: Renal function essentially unchanged from yesterday, but creatinine increased from 1.0-1.4 in the last 48 hours.  Normal potassium.  Note that BNP on this admission was substantially lower than it was on the previous admission.  PLAN: I would continue intravenous diuretics 1 more day, pushing towards establishing a more accurate "dry weight".  Ultimately, symptom relief is the most important goal and renal function parameters are not as important.  Sanda Klein, MD, [place] [phone] 11/26/2017, 10:41 AM

## 2017-11-26 NOTE — Evaluation (Signed)
Physical Therapy Evaluation Patient Details Name: Harry Hinton MRN: [id number] DOB: 1932-10-17 Today's Date: 11/26/2017   History of Present Illness  Pt is an 84 y.o. male with non-small cell lunch CA with mets to liver (being treated with chemo and radiation), admitted 11/24/17 for CHF exacerbation. Of note, admitted 2/6-2/19 with CHF and new LV dysfunction. PMH also includes HTN.    Clinical Impression  Pt presents with an overall decrease in functional mobility secondary to above. PTA, pt recently d/c home with HHPT services; has been ambulating with RW and has family available for 24/7 support. Today, pt able to amb short distance with HHA and supervision for safety; further mobility limited secondary to fatigue and SOB. Educ on energy conservation, fall risk reduction, and importance of continued mobility. Pt would benefit from continued acute PT services to maximize functional mobility and independence prior to d/c with continued HHPT services.     Follow Up Recommendations Home health PT;Supervision for mobility/OOB    Equipment Recommendations  Other (comment)(rollator)    Recommendations for Other Services       Precautions / Restrictions Precautions Precautions: Fall Restrictions Weight Bearing Restrictions: No      Mobility  Bed Mobility               General bed mobility comments: Received sitting in chair  Transfers Overall transfer level: Needs assistance Equipment used: None Transfers: Sit to/from Stand Sit to Stand: Supervision            Ambulation/Gait Ambulation/Gait assistance: Min guard Ambulation Distance (Feet): 5 Feet Assistive device: 1 person hand held assist Gait Pattern/deviations: Step-through pattern;Decreased stride length;Trunk flexed Gait velocity: Decreased Gait velocity interpretation: <1.8 ft/sec, indicative of risk for recurrent falls General Gait Details: Pt took steps from chair to [facility] with HHA; did not have time to walk to  bathroom secondary to urgency. Declining further mobility secondary to fatigue  Stairs            Wheelchair Mobility    Modified Rankin (Stroke Patients Only)       Balance Overall balance assessment: Needs assistance Sitting-balance support: No upper extremity supported;Feet supported Sitting balance-Leahy Scale: Good     Standing balance support: No upper extremity supported Standing balance-Leahy Scale: Fair                               Pertinent Vitals/Pain Pain Assessment: No/denies pain    Home Living Family/patient expects to be discharged to:: Private residence Living Arrangements: Spouse/significant other Available Help at Discharge: Family;Available 24 hours/day Type of Home: House Home Access: Stairs to enter Entrance Stairs-Rails: None Entrance Stairs-Number of Steps: 1 Home Layout: One level Home Equipment: Shower seat;Cane - single point      Prior Function Level of Independence: Independent with assistive device(s)         Comments: Mod indep with RW. sat to shower     Hand Dominance   Dominant Hand: Left    Extremity/Trunk Assessment   Upper Extremity Assessment Upper Extremity Assessment: Overall WFL for tasks assessed    Lower Extremity Assessment Lower Extremity Assessment: Generalized weakness       Communication   Communication: No difficulties  Cognition Arousal/Alertness: Awake/alert Behavior During Therapy: WFL for tasks assessed/performed Overall Cognitive Status: Within Functional Limits for tasks assessed  General Comments      Exercises     Assessment/Plan    PT Assessment Patient needs continued PT services  PT Problem List Decreased strength;Decreased activity tolerance;Decreased balance;Decreased mobility;Cardiopulmonary status limiting activity       PT Treatment Interventions DME instruction;Gait training;Functional mobility  training;Therapeutic activities;Therapeutic exercise;Balance training;Patient/family education;Stair training    PT Goals (Current goals can be found in the Care Plan section)  Acute Rehab PT Goals Patient Stated Goal: Return home with better breathing PT Goal Formulation: With patient Time For Goal Achievement: 12/10/17 Potential to Achieve Goals: Good    Frequency Min 3X/week   Barriers to discharge        Co-evaluation               AM-PAC PT "6 Clicks" Daily Activity  Outcome Measure Difficulty turning over in bed (including adjusting bedclothes, sheets and blankets)?: None Difficulty moving from lying on back to sitting on the side of the bed? : None Difficulty sitting down on and standing up from a chair with arms (e.g., wheelchair, bedside commode, etc,.)?: A Little Help needed moving to and from a bed to chair (including a wheelchair)?: A Little Help needed walking in hospital room?: A Little Help needed climbing 3-5 steps with a railing? : A Little 6 Click Score: 20    End of Session Equipment Utilized During Treatment: Gait belt;Oxygen Activity Tolerance: Patient tolerated treatment well;Patient limited by fatigue Patient left: in chair;with call bell/phone within reach;with family/visitor present Nurse Communication: Mobility status PT Visit Diagnosis: Muscle weakness (generalized) (M62.81);Difficulty in walking, not elsewhere classified (R26.2)    Time: 4818-5909 PT Time Calculation (min) (ACUTE ONLY): 27 min   Charges:   PT Evaluation $PT Eval Moderate Complexity: 1 Mod PT Treatments $Self Care/Home Management: 8-22   PT G Codes:       Mabeline Caras, PT, DPT Acute Rehab Services  Pager: [place] 11/26/2017, 5:03 PM

## 2017-11-26 NOTE — Plan of Care (Signed)
Patient complained of sacral discomfort. He was repositioned as needed to promote comfort and decrease risk for breakdown

## 2017-11-27 ENCOUNTER — Telehealth: Payer: Self-pay | Admitting: Cardiovascular Disease

## 2017-11-27 DIAGNOSIS — I509 Heart failure, unspecified: Secondary | ICD-10-CM

## 2017-11-27 DIAGNOSIS — J700 Acute pulmonary manifestations due to radiation: Secondary | ICD-10-CM

## 2017-11-27 DIAGNOSIS — R1310 Dysphagia, unspecified: Secondary | ICD-10-CM

## 2017-11-27 DIAGNOSIS — Z66 Do not resuscitate: Secondary | ICD-10-CM

## 2017-11-27 LAB — BASIC METABOLIC PANEL
Anion gap: 13 (ref 5–15)
BUN: 38 mg/dL — AB (ref 6–20)
CALCIUM: 8.4 mg/dL — AB (ref 8.9–10.3)
CO2: 30 mmol/L (ref 22–32)
CREATININE: 1.44 mg/dL — AB (ref 0.61–1.24)
Chloride: 84 mmol/L — ABNORMAL LOW (ref 101–111)
GFR calc Af Amer: 51 mL/min — ABNORMAL LOW (ref >60)
GFR calc non Af Amer: 44 mL/min — ABNORMAL LOW (ref >60)
GLUCOSE: 97 mg/dL (ref 65–99)
POTASSIUM: 4.2 mmol/L (ref 3.5–5.1)
SODIUM: 127 mmol/L — AB (ref 135–145)

## 2017-11-27 MED ORDER — CARVEDILOL 3.125 MG PO TABS
3.1250 mg | ORAL_TABLET | Freq: Two times a day (BID) | ORAL | Status: DC
  Administered 2017-11-27 – 2017-11-29 (×5): 3.125 mg via ORAL
  Filled 2017-11-27 (×5): qty 1

## 2017-11-27 MED ORDER — PREDNISONE 20 MG PO TABS
40.0000 mg | ORAL_TABLET | Freq: Every day | ORAL | Status: DC
  Administered 2017-11-27 – 2017-11-29 (×3): 40 mg via ORAL
  Filled 2017-11-27 (×3): qty 2

## 2017-11-27 MED ORDER — ORAL CARE MOUTH RINSE
15.0000 mL | Freq: Two times a day (BID) | OROMUCOSAL | Status: DC
  Administered 2017-11-27 – 2017-11-29 (×5): 15 mL via OROMUCOSAL

## 2017-11-27 NOTE — Consult Note (Signed)
Referral MD  Reason for Referral: Cardiomyopathy-dyspnea on exertion; metastatic adenocarcinoma of the lung  Chief Complaint  Patient presents with  . Shortness of Breath  : I get short of breath when I walk.  HPI: Mr. Ricketson is well-known to me.  He is a 84 year old white male.  He has metastatic adenocarcinoma of the lung.  He actually presented back in May 2018.  He had locally advanced-stage IIIb-disease.  He was treated with radiation and chemotherapy.  He, unfortunately, progressed despite this.  He had a new liver met.  This was biopsied.  It was adenocarcinoma.  Unfortunately, there is no actionable mutation.  He was supposed to have hepatic ablation.  However, he was found to have progressive liver metastases.  As such, we were to start him on systemic chemotherapy on February 18.  Over the past several weeks, he has been having issues with shortness of breath.  I thought that this was radiation pneumonitis.  We had him on some steroids.  This only helped transiently.  He was admitted a few days ago.  He was found to have cardiomyopathy with an ejection fraction on echo of 35-40%.  His prior echocardiogram in May 2018 showed an ejection fraction of 55-60%.  He has been seen by cardiology.  He has been diuresed.  He has had no cardiotoxic agents.  The radiation that he took really should not affect cardiac function for several years.  Again, he is on inotropic agents to try to help with his cardiac function.  He has had no bleeding.  He has had no fever.  He said no diarrhea.  His labs that were done yesterday showed a white cell count 11.5.  Hemoglobin 10.7.  Platelet count 203.  His electrolytes showed a sodium of 127.  Potassium 4.2.  His creatinine is 1.44.  There is been no diarrhea.  He has had no productive cough.  His appetite is improved.  Overall, his performance status is ECOG 2.    Past Medical History:  Diagnosis Date  . Adenocarcinoma of lung metastatic to  liver, right ([place]) 11/13/2017  . AKI (acute kidney injury) ([place]) 03/10/2017   NOTED ON ECHO  . Anemia    history of  . Aortic valve sclerosis 03/09/2017   NOTED ON ECHO  . CHF (congestive heart failure) ([place]) 11/24/2017  . Counseling regarding goals of care 03/26/2017  . Dyspnea   . GERD (gastroesophageal reflux disease)    "only when in chemo/radiation" (11/25/2017)  . Hearing loss   . History of blood transfusion    "@ least 3; all related to low counts S/P radiation" (11/25/2017)  . Hypertension   . Iron deficiency anemia    "he's had 2 iron transfusions" (11/25/2017)  . Lung cancer, primary, with metastasis from lung to other site, right ([place]) 11/13/2017    left lung CA travelled to his liver" (11/25/2017)  . LVH (left ventricular hypertrophy) 03/10/2017   mild, NOTED ON ECHO  . Migraine    "none since his 30's" (11/25/2017)  . MR (mitral regurgitation)    trivial, NOTED ON ECHO  . On home oxygen therapy    "2L; 24/7" (11/25/2017)  . Pneumonia ?10/2017   "vs bronchitis"  . Port-A-Cath in place   . Sinus problem   . Tingling    right foot  :  Past Surgical History:  Procedure Laterality Date  . CATARACT EXTRACTION W/ INTRAOCULAR LENS  IMPLANT, BILATERAL Bilateral ~ 2016  . COLONOSCOPY    .  IR FLUORO GUIDE PORT INSERTION RIGHT  04/06/2017  . IR RADIOLOGIST EVAL & MGMT  09/30/2017  . IR US GUIDE VASC ACCESS RIGHT  04/06/2017  . LIVER BIOPSY    . RADIOLOGY WITH ANESTHESIA N/A 11/06/2017   Procedure: CT MICROWAVE THERMAL ABLATION - LIVER;  Surgeon: Aletta Edouard, MD;  Location: [facility];  Service: Radiology;  Laterality: N/A;  . TONSILLECTOMY     age 9  . VASECTOMY    . VIDEO BRONCHOSCOPY WITH ENDOBRONCHIAL ULTRASOUND  03/11/2017   Procedure: VIDEO BRONCHOSCOPY WITH ENDOBRONCHIAL ULTRASOUND;  Surgeon: Collene Gobble, MD;  Location: [facility];  Service: Thoracic;;  :   Current Facility-Administered Medications:  .  acetaminophen (TYLENOL) tablet 650 mg, 650 mg, Oral, Q6H PRN **OR**  acetaminophen (TYLENOL) suppository 650 mg, 650 mg, Rectal, Q6H PRN, Amin, Sumayya, MD .  albuterol (PROVENTIL) (2.5 MG/3ML) 0.083% nebulizer solution 2.5 mg, 2.5 mg, Nebulization, Q4H PRN, Oval Linsey, MD .  aspirin EC tablet 81 mg, 81 mg, Oral, QHS, Amin, Soundra Pilon, MD, 81 mg at 11/26/17 2134 .  carvedilol (COREG) tablet 3.125 mg, 3.125 mg, Oral, BID WC, Oval Linsey, MD .  dronabinol (MARINOL) capsule 2.5 mg, 2.5 mg, Oral, BID AC, Lorella Nimrod, MD, 2.5 mg at 11/26/17 1706 .  enoxaparin (LOVENOX) injection 40 mg, 40 mg, Subcutaneous, Q24H, Amin, Sumayya, MD, 40 mg at 11/26/17 2133 .  furosemide (LASIX) injection 80 mg, 80 mg, Intravenous, BID, Skeet Latch, MD, 80 mg at 11/26/17 1706 .  HYDROcodone-acetaminophen (NORCO) 7.5-325 MG per tablet 1 tablet, 1 tablet, Oral, Q6H PRN, Basilio Cairo, NP, 1 tablet at 11/26/17 2219 .  ipratropium-albuterol (DUONEB) 0.5-2.5 (3) MG/3ML nebulizer solution 3 mL, 3 mL, Nebulization, Q4H PRN, Oval Linsey, MD .  multivitamin with minerals tablet 1 tablet, 1 tablet, Oral, Daily, Lorella Nimrod, MD, 1 tablet at 11/26/17 0836 .  polyethylene glycol (MIRALAX / GLYCOLAX) packet 17 g, 17 g, Oral, Daily PRN, Lorella Nimrod, MD, 17 g at 11/26/17 6629 .  sodium chloride flush (NS) 0.9 % injection 10-40 mL, 10-40 mL, Intracatheter, Q12H, Oval Linsey, MD, 10 mL at 11/26/17 2134 .  sodium chloride flush (NS) 0.9 % injection 10-40 mL, 10-40 mL, Intracatheter, PRN, Oval Linsey, MD, 10 mL at 11/26/17 0505  Facility-Administered Medications Ordered in Other Encounters:  .  sodium chloride flush (NS) 0.9 % injection 10 mL, 10 mL, Intravenous, PRN, [place], Sarah M, NP, 10 mL at 11/13/17 1456:  . aspirin EC  81 mg Oral QHS  . carvedilol  3.125 mg Oral BID WC  . dronabinol  2.5 mg Oral BID AC  . enoxaparin (LOVENOX) injection  40 mg Subcutaneous Q24H  . furosemide  80 mg Intravenous BID  . multivitamin with minerals  1 tablet Oral Daily  . sodium  chloride flush  10-40 mL Intracatheter Q12H  :  Allergies  Allergen Reactions  . Chlorhexidine Itching and Rash  :  Family History  Problem Relation Age of Onset  . Heart attack Father 35  . Congestive Heart Failure Father   :  Social History   Socioeconomic History  . Marital status: Married    Spouse name: Not on file  . Number of children: Not on file  . Years of education: Not on file  . Highest education level: Not on file  Social Needs  . Financial resource strain: Not on file  . Food insecurity - worry: Not on file  . Food insecurity - inability: Not on file  . Transportation needs -  medical: Not on file  . Transportation needs - non-medical: Not on file  Occupational History  . Not on file  Tobacco Use  . Smoking status: Former Smoker    Packs/day: 0.50    Years: 36.00    Pack years: 18.00    Types: Cigarettes    Last attempt to quit: 03/10/1987    Years since quitting: 30.7  . Smokeless tobacco: Never Used  Substance and Sexual Activity  . Alcohol use: Yes    Comment: 11/25/2017 "glass of wine a couple times/year"  . Drug use: No  . Sexual activity: Not Currently  Other Topics Concern  . Not on file  Social History Narrative  . Not on file  :  Pertinent items are noted in HPI.  Exam: as stated above Patient Vitals for the past 24 hrs:  BP Temp Temp src Pulse Resp SpO2 Weight  11/27/17 0639 (!) 138/99 97.7 F (36.5 C) Oral 76 18 98 % 171 lb 15.3 oz (78 kg)  11/26/17 2017 134/64 97.6 F (36.4 C) Oral 82 18 95 % -  11/26/17 1500 130/68 97.6 F (36.4 C) Oral 89 18 96 % -  11/26/17 1337 - - - - - 96 % -  11/26/17 0844 - - - - - 92 % -  11/26/17 0835 128/78 - - 95 - - -     Recent Labs    11/24/17 1341 11/25/17 0417  WBC 11.7* 11.5*  HGB 11.3* 10.7*  HCT 33.6* 32.6*  PLT 196 203   Recent Labs    11/26/17 0459 11/27/17 0348  NA 127* 127*  K 4.1 4.2  CL 85* 84*  CO2 31 30  GLUCOSE 96 97  BUN 34* 38*  CREATININE 1.42* 1.44*  CALCIUM  8.1* 8.4*    Blood smear review: None  Pathology: None    Assessment and Plan: Mr. Ricciardi is a 84 year old white male.  He has metastatic adenocarcinoma of the lung.  His primary issue right now is the cardiomyopathy.  Again, I am not sure why he would have a significant decline in his cardiac function.  I have to believe that this is more so related to his age and probable cardiac risk factors.  He was supposed to have treatment on 18 February.  However, I just do not think he will be ready for treatment.  We will move his treatments back a week.  I think this would be very helpful for him.  He is realistic as to what is going on.  He understands that it is certainly possible that he may have cardiac issues that determine his prognosis and not his lung cancer.  He is a DO NOT RESUSCITATE.  I totally agree with this decision.  For right now, I cannot think of any intervention that we need to do to try to help out.  Cardiology is on top of this.  Hopefully, he will achieve better cardiac functioning.  I very much appreciate everybody's help.  Again, we will move his treatments back by a week and may be treated at that time.  It is possible that he just may not be able to have any treatments if his performance status does not improve.  He understands this.  Lattie Haw, MD  [place] 3:18

## 2017-11-27 NOTE — Telephone Encounter (Signed)
TOC Phone Call- APpt is on 12/03/17 at 8:30am w/ Kerin Ransom . Thanks

## 2017-11-27 NOTE — Progress Notes (Signed)
Internal Medicine Attending  Date: 11/27/2017  Patient name: Harry Hinton Medical record number: [id number] Date of birth: 23-Dec-1932 Age: 84 y.o. Gender: male  I saw and evaluated the patient. I reviewed the resident's note by Dr. Shan Levans and I agree with the resident's findings and plans as documented in his progress note.  When seen on rounds this morning Harry Hinton felt slightly improved from the day before. He did mention that he choked on some apple juice. When asked if he had trouble swallowing liquids prior to admission he admitted that he has had difficulty. He also spoke with his oncologist and is willing to consider therapy for his radiation pneumonitis. We will start the prednisone 40 mg by mouth daily for 2 weeks and taper thereafter. We may need to adjust the diuretics, but will be able to do so as he is currently under our direct supervision. We will also obtain a swallowing evaluation to make sure that aspiration is not also playing a role given the difficulties he is having with liquids lately. I agree with continued attempts at IV diuresis given that his renal function is holding up relatively well and he remains hypervolemic.

## 2017-11-27 NOTE — Telephone Encounter (Signed)
Closed Encounter  °

## 2017-11-27 NOTE — Progress Notes (Signed)
Subjective: Patient reports feeling overall pretty well.  Is not feeling short of breath with the oxygen at rest.  He is still not exerted himself much he has been helped to the sink and reports not getting too short winded during that transition but he required a full assist.    Objective:  Vital signs in last 24 hours: Vitals:   11/27/17 0639 11/27/17 0744 11/27/17 1120 11/27/17 1546  BP: (!) 138/99 (!) 144/95 112/72 (!) 167/88  Pulse: 76 88 64 (!) 110  Resp: 18 18 18 18   Temp: 97.7 F (36.5 C) 97.8 F (36.6 C) 97.6 F (36.4 C) (!) 97.3 F (36.3 C)  TempSrc: Oral Axillary Axillary Axillary  SpO2: 98% 95% 95% 92%  Weight: 171 lb 15.3 oz (78 kg)     Height:       Physical Exam  Constitutional: He is oriented to person, place, and time. He appears well-developed and well-nourished.  Eyes: Right eye exhibits no discharge. Left eye exhibits no discharge. No scleral icterus.  Neck: No JVD present.  Cardiovascular: Normal rate, normal heart sounds and intact distal pulses. Exam reveals no gallop and no friction rub.  No murmur heard. Sinus rhythm with frequent PACs  Pulmonary/Chest: Effort normal. No respiratory distress. He has no decreased breath sounds. He has no wheezes. He has rales in the right lower field and the left lower field.  Abdominal: Soft. Bowel sounds are normal. He exhibits no distension and no mass. There is no tenderness. There is no guarding.  Musculoskeletal:       Right lower leg: He exhibits edema.       Left lower leg: He exhibits edema.  Neurological: He is alert and oriented to person, place, and time.   BMP Latest Ref Rng & Units 11/27/2017 11/26/2017 11/25/2017  Glucose 65 - 99 mg/dL 97 96 111(H)  BUN 6 - 20 mg/dL 38(H) 34(H) 35(H)  Creatinine 0.61 - 1.24 mg/dL 1.44(H) 1.42(H) 1.35(H)  Sodium 135 - 145 mmol/L 127(L) 127(L) 124(L)  Potassium 3.5 - 5.1 mmol/L 4.2 4.1 4.0  Chloride 101 - 111 mmol/L 84(L) 85(L) 81(L)  CO2 22 - 32 mmol/L 30 31 31   Calcium  8.9 - 10.3 mg/dL 8.4(L) 8.1(L) 8.2(L)    Intake/Output Summary (Last 24 hours) at 11/27/2017 1617 Last data filed at 11/27/2017 1607 Gross per 24 hour  Intake 300 ml  Output 1620 ml  Net -1320 ml   Assessment/Plan:   Acute on chronic HFrEF with hypoxemia Increased SOB and weight gain at home, not following dietary/fluid restrictions, not improved with home nebulizer therapy  -no jvd,  LE edema persists -existing lung disease, cancer and damage from chemo/ radiation also contributing to shortness of breath -cr relatively stable today, poor urine output yesterday -will get lasix 80mg  BID today  -fluid restriction, strict I's and O's, daily weights  -cardiology consulted appreciate recs -continue low dose carvedilol, ARB will be added if kidneys and BP allow  Radiation Pneumonitis Last radiation treatment 05/2017, Likely contributing to patient's shortness of breath  -will begin prednisone 40mg  daily with goal of 2 weeks of therapy  Hyponatremia 2/2 hypervolemia w/ HFrEF and likely SIADH contributing as well  -continue diuresis, fluid restriction   Paraseptal emphysema  -continue scheduled duoneb therapy every 6 hours  Metastatic Lung Adenocarcinoma  -Dr. Marin Olp is of the opinion the radiation is not contributing to the heart failure.  He has agreed that it will be best to delay further chemotherapy for the time  being and monitor for if and when the appropriate time to reinstitute therapy comes  Dispo: Anticipated discharge in approximately 2-3day(s).   Harry Roan, MD 11/27/2017, 4:09 PM Vickki Muff MD PGY-1 Internal Medicine Pager # [phone]

## 2017-11-27 NOTE — Progress Notes (Signed)
Progress Note  Patient Name: Harry Hinton Date of Encounter: 11/27/2017  Primary Cardiologist: Larae Grooms, MD   Subjective   Substantial improvement in dyspnea.  Still has lower extremity edema. 1.9 L net diuresis since admission.  Weight is essentially unchanged however. Stable creatinine approximately 1.3.  Inpatient Medications    Scheduled Meds: . aspirin EC  81 mg Oral QHS  . carvedilol  3.125 mg Oral BID WC  . dronabinol  2.5 mg Oral BID AC  . enoxaparin (LOVENOX) injection  40 mg Subcutaneous Q24H  . furosemide  80 mg Intravenous BID  . mouth rinse  15 mL Mouth Rinse BID  . multivitamin with minerals  1 tablet Oral Daily  . sodium chloride flush  10-40 mL Intracatheter Q12H   Continuous Infusions:  PRN Meds: acetaminophen **OR** acetaminophen, albuterol, HYDROcodone-acetaminophen, ipratropium-albuterol, polyethylene glycol, sodium chloride flush   Vital Signs    Vitals:   11/26/17 1500 11/26/17 2017 11/27/17 0639 11/27/17 0744  BP: 130/68 134/64 (!) 138/99 (!) 144/95  Pulse: 89 82 76 88  Resp: 18 18 18 18   Temp: 97.6 F (36.4 C) 97.6 F (36.4 C) 97.7 F (36.5 C) 97.8 F (36.6 C)  TempSrc: Oral Oral Oral Axillary  SpO2: 96% 95% 98% 95%  Weight:   171 lb 15.3 oz (78 kg)   Height:        Intake/Output Summary (Last 24 hours) at 11/27/2017 0840 Last data filed at 11/27/2017 0831 Gross per 24 hour  Intake 420 ml  Output 920 ml  Net -500 ml   Filed Weights   11/25/17 0454 11/26/17 0442 11/27/17 0639  Weight: 169 lb (76.7 kg) 170 lb (77.1 kg) 171 lb 15.3 oz (78 kg)    Telemetry    Sinus rhythm frequent PACs and brief bursts of atrial tachycardia, no atrial fibrillation seen- Personally Reviewed  ECG    No new tracing- Personally Reviewed  Physical Exam  Looks more alert and more comfortable than yesterday GEN: No acute distress.   Neck: No JVD Cardiac: RRR with occasional ectopy, no murmurs, rubs, or gallops.  Respiratory:  diminished in  bases bilaterally, otherwise clear to auscultation bilaterally. GI: Soft, nontender, non-distended  MS:  1-2+ edema right pretibial, 3+ edema left pretibial; No deformity. Neuro:  Nonfocal  Psych: Normal affect   Labs    Chemistry Recent Labs  Lab 11/24/17 1341  11/25/17 1621 11/26/17 0459 11/27/17 0348  NA 123*   < > 124* 127* 127*  K 4.1   < > 4.0 4.1 4.2  CL 80*   < > 81* 85* 84*  CO2 32   < > 31 31 30   GLUCOSE 112*   < > 111* 96 97  BUN 33*   < > 35* 34* 38*  CREATININE 1.02   < > 1.35* 1.42* 1.44*  CALCIUM 8.1*   < > 8.2* 8.1* 8.4*  PROT 6.1*  --   --   --   --   ALBUMIN 2.4*  --   --   --   --   AST 27  --   --   --   --   ALT 31  --   --   --   --   ALKPHOS 105  --   --   --   --   BILITOT 0.8  --   --   --   --   GFRNONAA >60   < > 47* 44* 44*  GFRAA >60   < >  55* 52* 51*  ANIONGAP 11   < > 12 11 13    < > = values in this interval not displayed.     Hematology Recent Labs  Lab 11/24/17 1341 11/25/17 0417  WBC 11.7* 11.5*  RBC 3.82* 3.68*  HGB 11.3* 10.7*  HCT 33.6* 32.6*  MCV 88.0 88.6  MCH 29.6 29.1  MCHC 33.6 32.8  RDW 15.4 15.6*  PLT 196 203    Cardiac EnzymesNo results for input(s): TROPONINI in the last 168 hours.  Recent Labs  Lab 11/24/17 1353  TROPIPOC 0.00     BNP Recent Labs  Lab 11/24/17 1341  BNP 810.6*     DDimer No results for input(s): DDIMER in the last 168 hours.   Radiology    No results found.  Cardiac Studies   Echo 11/18/17- Study Conclusions  - Left ventricle: The cavity size was normal. Wall thickness was normal. Systolic function was moderately reduced. The estimated ejection fraction was in the range of 35% to 40%. Features are consistent with a pseudonormal left ventricular filling pattern, with concomitant abnormal relaxation and increased filling pressure (grade 2 diastolic dysfunction). - Aortic valve: Mildly to moderately calcified annulus. Moderately thickened, moderately calcified  leaflets. There was mild regurgitation. - Mitral valve: Mildly calcified annulus. - Right ventricle: The cavity size was mildly dilated. Systolic function was mildly reduced. - Pulmonary arteries: Systolic pressure was moderately increased. PA peak pressure: 45 mm Hg (S).    Patient Profile     84 y.o. male with locally advanced and metastatic adenocarcinoma of the lung, readmitted with shortness of breath after recent hospitalization for new onset systolic heart failure (EF 35-40%), high level suspicion for underlying coronary artery disease with conservative management due to advanced malignancy.  Also has hyponatremia at least in part due to SIADH, uncertain contribution of heart failure and acute on chronic renal insufficiency.  Assessment & Plan    It remains difficult to distinguish the relative contribution of advanced lung malignancy and heart failure to his shortness of breath.  His BNP is substantially lower on the current admission compared to his last visit but is still quite high.  He has substantial edema in his lower extremities, slightly asymmetrical.  Despite worsening renal function, I think we need to continue diuretic therapy.  He is clearly still hypervolemic.  The focus is primary on palliation of symptoms, especially dyspnea.  Renal function plays a secondary role in this gentleman with advanced malignancy.  For questions or updates, please contact [place] Please consult [email or web address] for contact info under Cardiology/STEMI.      Signed, Sanda Klein, MD  11/27/2017, 8:40 AM

## 2017-11-27 NOTE — Care Management Important Message (Signed)
Important Message  Patient Details  Name: Harry Hinton MRN: [id number] Date of Birth: 1933/01/13   Medicare Important Message Given:  Yes    Harry Hinton 11/27/2017, 12:00 PM

## 2017-11-27 NOTE — Progress Notes (Signed)
Daily Progress Note   Patient Name: Harry Hinton       Date: 11/27/2017 DOB: [birth year]  Age: 84 y.o. MRN#: [id number] Attending Physician: Oval Linsey, MD Primary Care Physician: Myrlene Broker, MD Admit Date: 11/24/2017  Reason for Consultation/Follow-up: Establishing goals of care  Subjective: Patient awake, alert, oriented. States his breathing has improved today but does get short of breath on exertion. Denies pain. States relief from prn Norco.  He was relieved to see Dr. Marin Olp this morning. Wife and son at bedside. His f/u oncology appointment has been pushed back a week. Patient and family remain hopeful he will feel better with further diuresis in hopes of pursuing next oncology recommendation for chemo or immunotherapy.   Wife has PMT contact information and encouraged her to call team phone with questions/concerns.   Length of Stay: 3  Current Medications: Scheduled Meds:  . aspirin EC  81 mg Oral QHS  . carvedilol  3.125 mg Oral BID WC  . dronabinol  2.5 mg Oral BID AC  . enoxaparin (LOVENOX) injection  40 mg Subcutaneous Q24H  . furosemide  80 mg Intravenous BID  . mouth rinse  15 mL Mouth Rinse BID  . multivitamin with minerals  1 tablet Oral Daily  . predniSONE  40 mg Oral Daily  . sodium chloride flush  10-40 mL Intracatheter Q12H    Continuous Infusions:   PRN Meds: acetaminophen **OR** acetaminophen, albuterol, HYDROcodone-acetaminophen, ipratropium-albuterol, polyethylene glycol, sodium chloride flush  Physical Exam          Vital Signs: BP 112/72 (BP Location: Right Arm)   Pulse 64   Temp 97.6 F (36.4 C) (Axillary)   Resp 18   Ht 6' (1.829 m)   Wt 78 kg (171 lb 15.3 oz)   SpO2 95%   BMI 23.32 kg/m  SpO2: SpO2: 95 % O2 Device: O2 Device:  Nasal Cannula O2 Flow Rate: O2 Flow Rate (L/min): 3 L/min  Intake/output summary:   Intake/Output Summary (Last 24 hours) at 11/27/2017 1444 Last data filed at 11/27/2017 1121 Gross per 24 hour  Intake 300 ml  Output 1220 ml  Net -920 ml   LBM: Last BM Date: 11/26/17 Baseline Weight: Weight: 77.8 kg (171 lb 8 oz) Most recent weight: Weight: 78 kg (171 lb 15.3 oz)  Palliative Assessment/Data: PPS 50%   Flowsheet Rows     Most Recent Value  Intake Tab  Referral Department  Hospitalist  Unit at Time of Referral  Cardiac/Telemetry Unit  Palliative Care Primary Diagnosis  -- [lung cancer/CHF]  Palliative Care Type  New Palliative care  Reason for referral  Clarify Goals of Care  Date first seen by Palliative Care  11/25/17  Clinical Assessment  Palliative Performance Scale Score  50%  Psychosocial & Spiritual Assessment  Palliative Care Outcomes  Patient/Family meeting held?  Yes  Who was at the meeting?  patient, wife, son  Palliative Care Outcomes  Clarified goals of care, Provided psychosocial or spiritual support, Improved pain interventions, ACP counseling assistance      Patient Active Problem List   Diagnosis Date Noted  . Cardiomyopathy- etiology not yet determined 11/26/2017  . Acute renal insufficiency 11/26/2017  . SIADH (syndrome of inappropriate ADH production) ([place])   . Pneumonitis, radiation ([place])   . Acute on chronic respiratory failure ([place])   . Palliative care by specialist   . Acute on chronic heart failure ([place]) 11/24/2017  . Paraseptal emphysema ([place]) 11/22/2017  . Acute on chronic combined systolic and diastolic CHF (congestive heart failure) ([place])   . Metastatic non-small cell lung cancer ([place])   . Lung cancer, primary, with metastasis from lung to other site, right ([place]) 11/13/2017  . 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 IDA (iron deficiency anemia) 08/26/2017  . Goals of care, counseling/discussion  03/26/2017  . Abdominal aortic aneurysm dissection ([place])   . Tobacco abuse 03/10/2017  . Dizziness 03/10/2017  . Hyponatremia 03/10/2017  . Essential hypertension 03/09/2017  . Atypical chest pain 03/09/2017  . Malignant neoplasm of hilus of left lung ([place]) 03/09/2017    Palliative Care Assessment & Plan   Patient Profile: 84 y.o. male  with past medical history of metastatic lung cancer, CHF, mitral regurgitation, HTN, GERD, AKI, pneumonia, anemia  admitted on 11/24/2017 with shortness of breath and BLE edema. Recent hospitalization for acute CHF treated with diuretics discharged on 2/10 with home oxygen. Followed by Dr. Marin Olp for non-small cell lung cancer with metastases to live s/p chemotherapy and radiation. This hospitalization, patient receiving lasix 40mg  BID. Also with SIADH. Cardiology consult pending. Palliative medicine consultation for goals of care.   Assessment: Metastatic lung cancer CHF SIADH Acute on chronic respiratory failure  Recommendations/Plan:  DNR. Otherwise, full scope treatment.   Patient/family remain hopeful for clinical improvement from diuresis and outpatient oncology f/u for future chemo/immunotherapy.   Patient/family may benefit from continued support from outpatient palliative.   Code Status: DNR   Code Status Orders  (From admission, onward)        Start     Ordered   11/24/17 1844  Do not attempt resuscitation (DNR)  Continuous    Question Answer Comment  In the event of cardiac or respiratory ARREST Do not call a "code blue"   In the event of cardiac or respiratory ARREST Do not perform Intubation, CPR, defibrillation or ACLS   In the event of cardiac or respiratory ARREST Use medication by any route, position, wound care, and other measures to relive pain and suffering. May use oxygen, suction and manual treatment of airway obstruction as needed for comfort.   Comments Patient agreeable to very temporary intubation for respiratory  support      11/24/17 1844    Code Status History    Date Active Date Inactive Code  Status Order ID Comments User Context   11/18/2017 11:53 11/22/2017 18:41 Partial Code [id number]  Collier Salina, MD ED   03/09/2017 09:35 03/12/2017 17:16 Full Code [id number]  Thurnell Lose, MD Inpatient    Advance Directive Documentation     Most Recent Value  Type of Advance Directive  Living will, Healthcare Power of Attorney  Pre-existing out of facility DNR order (yellow form or pink MOST form)  No data  "MOST" Form in Place?  No data       Prognosis:   Unable to determine  Discharge Planning:  To Be Determined  Care plan was discussed with patient, wife, son  Thank you for allowing the Palliative Medicine Team to assist in the care of this patient.   Time In: 1430 Time Out: 1445 Total Time 63min Prolonged Time Billed  no       Greater than 50%  of this time was spent counseling and coordinating care related to the above assessment and plan.  Basilio Cairo, NP  Please contact Palliative Medicine Team phone at [phone] for questions and concerns.

## 2017-11-28 DIAGNOSIS — N289 Disorder of kidney and ureter, unspecified: Secondary | ICD-10-CM

## 2017-11-28 DIAGNOSIS — I10 Essential (primary) hypertension: Secondary | ICD-10-CM

## 2017-11-28 LAB — BASIC METABOLIC PANEL
Anion gap: 12 (ref 5–15)
BUN: 36 mg/dL — AB (ref 6–20)
CALCIUM: 8.6 mg/dL — AB (ref 8.9–10.3)
CO2: 32 mmol/L (ref 22–32)
Chloride: 87 mmol/L — ABNORMAL LOW (ref 101–111)
Creatinine, Ser: 1.26 mg/dL — ABNORMAL HIGH (ref 0.61–1.24)
GFR calc Af Amer: 60 mL/min — ABNORMAL LOW (ref >60)
GFR, EST NON AFRICAN AMERICAN: 51 mL/min — AB (ref >60)
GLUCOSE: 103 mg/dL — AB (ref 65–99)
Potassium: 3.9 mmol/L (ref 3.5–5.1)
Sodium: 131 mmol/L — ABNORMAL LOW (ref 135–145)

## 2017-11-28 NOTE — Progress Notes (Signed)
  Date: 11/28/2017  Patient name: Harry Hinton  Medical record number: [id number]  Date of birth: [birth year]   This patient's plan of care was discussed with the house staff. Please see Dr. Latina Craver note for complete details. I concur with her findings.   Sid Falcon, MD 11/28/2017, 11:23 AM

## 2017-11-28 NOTE — Progress Notes (Signed)
   Subjective: The patient was feeling much better when seen this morning.  He wants to go home, stating that he will prefer to stay at home at this time.  We discussed that he might need to at least 1 more day of IV diuresis as his renal functions are improving and urine output has improved over the past 24-hour.  Objective:  Vital signs in last 24 hours: Vitals:   11/27/17 1120 11/27/17 1546 11/27/17 2114 11/28/17 0511  BP: 112/72 (!) 167/88 140/70 128/67  Pulse: 64 (!) 110 77 88  Resp: 18 18 18 18   Temp: 97.6 F (36.4 C) (!) 97.3 F (36.3 C) 97.7 F (36.5 C) 97.9 F (36.6 C)  TempSrc: Axillary Axillary Oral Oral  SpO2: 95% 92% 94% 93%  Weight:    173 lb 8 oz (78.7 kg)  Height:       I/O last 3 completed shifts: In: 61 [P.O.:420] Out: [place].  Well-developed elderly man, in no acute distress. Lungs.  Bibasilar decreased breath sound, more pronounced on left mid to lower zone with few rhonchi. CVS.  Regular rate and rhythm. Abdomen.  Soft, nontender, nondistended, bowel sounds positive. Extremities.  2+ lower extremity edema up to below knees.  Assessment/Plan:  84 year old gentleman with stage IV lung cancer and heart failure.  Acute on chronic HFrEF with hypoxemia.  His dyspnea improving, continue to saturating mid 90s on 2 L currently.  His urine output and renal function improved over past 24-hour. -Cardiology would like to continue IV Lasix 80 mg twice daily for 1 more day. -1 L fluid restriction, with strict in and outs and daily weights. -Continue low-dose carvedilol.  Radiation Pneumonitis Last radiation treatment 05/2017, Likely contributing to patient's shortness of breath -Prednisone 40 mg daily was added yesterday for 2 weeks with a slow taper later on.  Hyponatremia 2/2 hypervolemia w/ HFrEF and likely SIADH.  Sodium continued to improve, it was 131 today. -Continue fluid restriction and diuresis.  Metastatic Lung Adenocarcinoma.   Patient and family want to continue chemotherapy-currently deferred 1 week-pending improvement in his heart failure.  Paraseptal emphysema. -Continue scheduled DuoNeb every 6 hourly  Dispo: Anticipated discharge in approximately 1-2 day(s).   Lorella Nimrod, MD 11/28/2017, 9:59 AM Pager: [phone]

## 2017-11-28 NOTE — Progress Notes (Signed)
Progress Note  Patient Name: Harry Hinton Date of Encounter: 11/28/2017  Primary Cardiologist: Larae Grooms, MD   Subjective   Breathing has improved - edema is better. Diuresed 1.4L negative overnight - now 3.2L negative overall, however, little recorded weight change. Creatinine improving with diuresis.  Inpatient Medications    Scheduled Meds: . aspirin EC  81 mg Oral QHS  . carvedilol  3.125 mg Oral BID WC  . dronabinol  2.5 mg Oral BID AC  . enoxaparin (LOVENOX) injection  40 mg Subcutaneous Q24H  . furosemide  80 mg Intravenous BID  . mouth rinse  15 mL Mouth Rinse BID  . multivitamin with minerals  1 tablet Oral Daily  . predniSONE  40 mg Oral Daily  . sodium chloride flush  10-40 mL Intracatheter Q12H   Continuous Infusions:  PRN Meds: acetaminophen **OR** acetaminophen, albuterol, HYDROcodone-acetaminophen, ipratropium-albuterol, polyethylene glycol, sodium chloride flush   Vital Signs    Vitals:   11/27/17 1120 11/27/17 1546 11/27/17 2114 11/28/17 0511  BP: 112/72 (!) 167/88 140/70 128/67  Pulse: 64 (!) 110 77 88  Resp: 18 18 18 18   Temp: 97.6 F (36.4 C) (!) 97.3 F (36.3 C) 97.7 F (36.5 C) 97.9 F (36.6 C)  TempSrc: Axillary Axillary Oral Oral  SpO2: 95% 92% 94% 93%  Weight:    173 lb 8 oz (78.7 kg)  Height:        Intake/Output Summary (Last 24 hours) at 11/28/2017 0911 Last data filed at 11/28/2017 0600 Gross per 24 hour  Intake 120 ml  Output 1700 ml  Net -1580 ml   Filed Weights   11/26/17 0442 11/27/17 0639 11/28/17 0511  Weight: 170 lb (77.1 kg) 171 lb 15.3 oz (78 kg) 173 lb 8 oz (78.7 kg)    Telemetry    Predominant sinus rhythm  ECG    N/A  Physical Exam   General appearance: alert and no distress Neck: no carotid bruit, no JVD and thyroid not enlarged, symmetric, no tenderness/mass/nodules Lungs: diminished breath sounds bibasilar Heart: regular rate and rhythm Abdomen: soft, non-tender; bowel sounds normal; no  masses,  no organomegaly Extremities: edema 2+ LE pitting edema Pulses: 2+ and symmetric Skin: pale, warm, dry Neurologic: Grossly normal Psych: Pleasant   Labs    Chemistry Recent Labs  Lab 11/24/17 1341  11/26/17 0459 11/27/17 0348 11/28/17 0402  NA 123*   < > 127* 127* 131*  K 4.1   < > 4.1 4.2 3.9  CL 80*   < > 85* 84* 87*  CO2 32   < > 31 30 32  GLUCOSE 112*   < > 96 97 103*  BUN 33*   < > 34* 38* 36*  CREATININE 1.02   < > 1.42* 1.44* 1.26*  CALCIUM 8.1*   < > 8.1* 8.4* 8.6*  PROT 6.1*  --   --   --   --   ALBUMIN 2.4*  --   --   --   --   AST 27  --   --   --   --   ALT 31  --   --   --   --   ALKPHOS 105  --   --   --   --   BILITOT 0.8  --   --   --   --   GFRNONAA >60   < > 44* 44* 51*  GFRAA >60   < > 52* 51* 60*  ANIONGAP 11   < >  11 13 12    < > = values in this interval not displayed.     Hematology Recent Labs  Lab 11/24/17 1341 11/25/17 0417  WBC 11.7* 11.5*  RBC 3.82* 3.68*  HGB 11.3* 10.7*  HCT 33.6* 32.6*  MCV 88.0 88.6  MCH 29.6 29.1  MCHC 33.6 32.8  RDW 15.4 15.6*  PLT 196 203    Cardiac EnzymesNo results for input(s): TROPONINI in the last 168 hours.  Recent Labs  Lab 11/24/17 1353  TROPIPOC 0.00     BNP Recent Labs  Lab 11/24/17 1341  BNP 810.6*     DDimer No results for input(s): DDIMER in the last 168 hours.   Radiology    No results found.  Cardiac Studies   Echo 11/18/17- Study Conclusions  - Left ventricle: The cavity size was normal. Wall thickness was normal. Systolic function was moderately reduced. The estimated ejection fraction was in the range of 35% to 40%. Features are consistent with a pseudonormal left ventricular filling pattern, with concomitant abnormal relaxation and increased filling pressure (grade 2 diastolic dysfunction). - Aortic valve: Mildly to moderately calcified annulus. Moderately thickened, moderately calcified leaflets. There was mild regurgitation. - Mitral  valve: Mildly calcified annulus. - Right ventricle: The cavity size was mildly dilated. Systolic function was mildly reduced. - Pulmonary arteries: Systolic pressure was moderately increased. PA peak pressure: 45 mm Hg (S).   Patient Profile     84 y.o. male with locally advanced and metastatic adenocarcinoma of the lung, readmitted with shortness of breath after recent hospitalization for new onset systolic heart failure (EF 35-40%), high level suspicion for underlying coronary artery disease with conservative management due to advanced malignancy.  Also has hyponatremia at least in part due to SIADH, uncertain contribution of heart failure and acute on chronic renal insufficiency.  Assessment & Plan    Diuresing well with improvement in pulmonary symptoms. Creatinine improved with diuresis. Remains volume overloaded on exam - 2+ edema, BNP 810. Continue diuresis today and re-assess tomorrow.  For questions or updates, please contact [place] Please consult [email or web address] for contact info under Cardiology/STEMI.   Pixie Casino, MD, [facility], [place] Director of the Advanced Lipid Disorders &  Cardiovascular Risk [facility] Diplomate of the American Board of Clinical Lipidology Attending Cardiologist  Direct Dial: [phone]  Fax: [phone]  Website:  [email or web address]  Pixie Casino, MD  11/28/2017, 9:11 AM

## 2017-11-28 NOTE — Evaluation (Signed)
Clinical/Bedside Swallow Evaluation Patient Details  Name: Harry Hinton MRN: [id number] Date of Birth: Jun 11, 1933  Today's Date: 11/28/2017 Time: SLP Start Time (ACUTE ONLY): 0831 SLP Stop Time (ACUTE ONLY): 0849 SLP Time Calculation (min) (ACUTE ONLY): 18 min  Past Medical History:  Past Medical History:  Diagnosis Date  . 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 AKI (acute kidney injury) ([place]) 03/10/2017   NOTED ON ECHO  . Anemia    history of  . Aortic valve sclerosis 03/09/2017   NOTED ON ECHO  . CHF (congestive heart failure) ([place]) 11/24/2017  . Counseling regarding goals of care 03/26/2017  . Dyspnea   . GERD (gastroesophageal reflux disease)    "only when in chemo/radiation" (11/25/2017)  . Hearing loss   . History of blood transfusion    "@ least 3; all related to low counts S/P radiation" (11/25/2017)  . Hypertension   . Iron deficiency anemia    "he's had 2 iron transfusions" (11/25/2017)  . Lung cancer, primary, with metastasis from lung to other site, right ([place]) 11/13/2017    left lung CA travelled to his liver" (11/25/2017)  . LVH (left ventricular hypertrophy) 03/10/2017   mild, NOTED ON ECHO  . Migraine    "none since his 30's" (11/25/2017)  . MR (mitral regurgitation)    trivial, NOTED ON ECHO  . On home oxygen therapy    "2L; 24/7" (11/25/2017)  . Pneumonia ?10/2017   "vs bronchitis"  . Port-A-Cath in place   . Sinus problem   . Tingling    right foot   Past Surgical History:  Past Surgical History:  Procedure Laterality Date  . CATARACT EXTRACTION W/ INTRAOCULAR LENS  IMPLANT, BILATERAL Bilateral ~ 2016  . COLONOSCOPY    . IR FLUORO GUIDE PORT INSERTION RIGHT  04/06/2017  . IR RADIOLOGIST EVAL & MGMT  09/30/2017  . IR US GUIDE VASC ACCESS RIGHT  04/06/2017  . LIVER BIOPSY    . RADIOLOGY WITH ANESTHESIA N/A 11/06/2017   Procedure: CT MICROWAVE THERMAL ABLATION - LIVER;  Surgeon: Aletta Edouard, MD;  Location: [facility];  Service:  Radiology;  Laterality: N/A;  . TONSILLECTOMY     age 66  . VASECTOMY    . VIDEO BRONCHOSCOPY WITH ENDOBRONCHIAL ULTRASOUND  03/11/2017   Procedure: VIDEO BRONCHOSCOPY WITH ENDOBRONCHIAL ULTRASOUND;  Surgeon: Collene Gobble, MD;  Location: [facility] OR;  Service: Thoracic;;   HPI:  84 year old man with a history of left lower lobe adenocarcinoma of the lung initially staged at IIIB and treated with radiation and carboplatin/etoposide now metastatic to the liver, hypertension, and gastroesophageal reflux disease who was recently discharged from the internal medicine teaching service after being diagnosed with acute on chronic combined systolic and diastolic heart failure that responded well to intravenous loop diuresis. He initially did well at home maintaining compliance with his Lasix 40 mg orally twice daily and salt restriction. Unfortunately, he drank a considerable amount of fluid and much more than his 1.5 L fluid restriction. His weight upon arrival to home after discharge was 168 pounds and over the next 24 hours his weight increased to 172 pounds. His primary care provider was called with this information and the patient was advised to return to the hospital. He was admitted to the internal medicine teaching service for recurrence of his acute on chronic combined systolic and diastolic heart failure. BSE ordered d/t intermittent c/o dysphagia during PO consumption; CXR on 11/24/17 indicating Fibrosis, likely due to radiation therapy  change, on the left. Small left pleural effusion with atelectasis in the left base. There is also mild right base atelectasis. No new opacity. Stable cardiacsilhouette. Port-A-Cath tip in right atrium. Bones osteoporotic  Assessment / Plan / Recommendation Clinical Impression   Pt appears to exhibit a normal oropharyngeal swallow; pt voiced concern with increased anxiety during PO consumption d/t radiation last summer and esophagus being affected causing dysphagia during  meals (improved with use of compensatory strategies); pt using compensatory strategies for small bites/sips and slow intake independently and stated this helps tremendously with meals; discussed breathing/swallowing reciprocity in setting of new onset CHF; pt in agreement with "taking breaks" prn during PO consumption when SOB occurs; ST will f/u with diet tolerance and education re: safety/aspiration precautions during consumption of meals/snacks.   SLP Visit Diagnosis: Dysphagia, unspecified (R13.10)    Aspiration Risk  Mild aspiration risk    Diet Recommendation   Regular/thin liquids (heart healthy)  Medication Administration: Whole meds with liquid    Other  Recommendations Oral Care Recommendations: Oral care BID   Follow up Recommendations None      Frequency and Duration min 1 x/week  1 week       Prognosis Prognosis for Safe Diet Advancement: Good      Swallow Study   General Date of Onset: 11/25/17 HPI: 84 year old man with a history of left lower lobe adenocarcinoma of the lung initially staged at IIIB and treated with radiation and carboplatin/etoposide now metastatic to the liver, hypertension, and gastroesophageal reflux disease who was recently discharged from the internal medicine teaching service after being diagnosed with acute on chronic combined systolic and diastolic heart failure that responded well to intravenous loop diuresis. He initially did well at home maintaining compliance with his Lasix 40 mg orally twice daily and salt restriction. Unfortunately, he drank a considerable amount of fluid and much more than his 1.5 L fluid restriction. His weight upon arrival to home after discharge was 168 pounds and over the next 24 hours his weight increased to 172 pounds. His primary care provider was called with this information and the patient was advised to return to the hospital. He was admitted to the internal medicine teaching service for recurrence of his acute on  chronic combined systolic and diastolic heart failure. Type of Study: Bedside Swallow Evaluation Previous Swallow Assessment: n/a Diet Prior to this Study: Regular;Thin liquids Temperature Spikes Noted: No Respiratory Status: Nasal cannula History of Recent Intubation: No Behavior/Cognition: Alert;Cooperative;Pleasant mood Oral Cavity Assessment: Within Functional Limits Oral Care Completed by SLP: No Oral Cavity - Dentition: Adequate natural dentition Vision: Functional for self-feeding Self-Feeding Abilities: Able to feed self Patient Positioning: Upright in bed Baseline Vocal Quality: Normal Volitional Cough: Strong Volitional Swallow: Able to elicit    Oral/Motor/Sensory Function Overall Oral Motor/Sensory Function: Within functional limits   Ice Chips Ice chips: Not tested   Thin Liquid Thin Liquid: Within functional limits Presentation: Cup;Straw Other Comments: small sips initiated by pt    Nectar Thick Nectar Thick Liquid: Not tested   Honey Thick Honey Thick Liquid: Not tested   Puree Puree: Within functional limits Presentation: Self Fed Other Comments: small bites initiated by pt   Solid      Solid: Within functional limits Presentation: Self Fed Other Comments: small bites        Elvina Sidle, M.S., CCC-SLP 11/28/2017,9:03 AM

## 2017-11-29 DIAGNOSIS — Z883 Allergy status to other anti-infective agents status: Secondary | ICD-10-CM

## 2017-11-29 DIAGNOSIS — I5023 Acute on chronic systolic (congestive) heart failure: Secondary | ICD-10-CM

## 2017-11-29 DIAGNOSIS — I429 Cardiomyopathy, unspecified: Secondary | ICD-10-CM

## 2017-11-29 DIAGNOSIS — J9621 Acute and chronic respiratory failure with hypoxia: Secondary | ICD-10-CM

## 2017-11-29 LAB — BASIC METABOLIC PANEL
Anion gap: 11 (ref 5–15)
BUN: 33 mg/dL — AB (ref 6–20)
CHLORIDE: 88 mmol/L — AB (ref 101–111)
CO2: 35 mmol/L — AB (ref 22–32)
CREATININE: 1.22 mg/dL (ref 0.61–1.24)
Calcium: 8.7 mg/dL — ABNORMAL LOW (ref 8.9–10.3)
GFR calc Af Amer: >60 mL/min (ref >60)
GFR calc non Af Amer: 53 mL/min — ABNORMAL LOW (ref >60)
Glucose, Bld: 105 mg/dL — ABNORMAL HIGH (ref 65–99)
Potassium: 3.8 mmol/L (ref 3.5–5.1)
SODIUM: 134 mmol/L — AB (ref 135–145)

## 2017-11-29 MED ORDER — FUROSEMIDE 80 MG PO TABS: 80.0000 mg | ORAL_TABLET | Freq: Every day | ORAL | Status: DC

## 2017-11-29 MED ORDER — FUROSEMIDE 80 MG PO TABS: 80.0000 mg | ORAL_TABLET | Freq: Every day | ORAL | 0 refills | Status: AC

## 2017-11-29 MED ORDER — HEPARIN SOD (PORK) LOCK FLUSH 100 UNIT/ML IV SOLN
500.0000 [IU] | INTRAVENOUS | Status: AC | PRN
  Administered 2017-11-29: 500 [IU]

## 2017-11-29 MED ORDER — PREDNISONE 20 MG PO TABS: 40.0000 mg | ORAL_TABLET | Freq: Every day | ORAL | 0 refills | Status: DC

## 2017-11-29 NOTE — Progress Notes (Signed)
Progress Note  Patient Name: Harry Hinton Date of Encounter: 11/29/2017  Primary Cardiologist: Larae Grooms, MD   Subjective   Breathing is good. Diuresed another 570 cc overnight - now out almost 4L. Sodium is near normal. Creatinine has normalized.   Inpatient Medications    Scheduled Meds: . aspirin EC  81 mg Oral QHS  . carvedilol  3.125 mg Oral BID WC  . dronabinol  2.5 mg Oral BID AC  . enoxaparin (LOVENOX) injection  40 mg Subcutaneous Q24H  . furosemide  80 mg Intravenous BID  . mouth rinse  15 mL Mouth Rinse BID  . multivitamin with minerals  1 tablet Oral Daily  . predniSONE  40 mg Oral Daily  . sodium chloride flush  10-40 mL Intracatheter Q12H   Continuous Infusions:  PRN Meds: acetaminophen **OR** acetaminophen, albuterol, HYDROcodone-acetaminophen, ipratropium-albuterol, polyethylene glycol, sodium chloride flush   Vital Signs    Vitals:   11/28/17 0511 11/28/17 1221 11/28/17 2104 11/29/17 0615  BP: 128/67 129/65 (!) 158/87 (!) 158/88  Pulse: 88 80 80 86  Resp: 18 20 20 20   Temp: 97.9 F (36.6 C) (!) 97.5 F (36.4 C) 97.6 F (36.4 C) (!) 97.4 F (36.3 C)  TempSrc: Oral Oral Oral Oral  SpO2: 93% 100% 100% 97%  Weight: 173 lb 8 oz (78.7 kg)   170 lb 14.4 oz (77.5 kg)  Height:        Intake/Output Summary (Last 24 hours) at 11/29/2017 1233 Last data filed at 11/29/2017 1016 Gross per 24 hour  Intake 480 ml  Output 1150 ml  Net -670 ml   Filed Weights   11/27/17 0639 11/28/17 0511 11/29/17 0615  Weight: 171 lb 15.3 oz (78 kg) 173 lb 8 oz (78.7 kg) 170 lb 14.4 oz (77.5 kg)    Telemetry    Sinus rhythm - personally reviewed  ECG    N/A  Physical Exam   General appearance: alert and no distress Neck: no carotid bruit, no JVD and thyroid not enlarged, symmetric, no tenderness/mass/nodules Lungs: diminished breath sounds bibasilar Heart: regular rate and rhythm Abdomen: soft, non-tender; bowel sounds normal; no masses,  no  organomegaly Extremities: edema 1+ LE pitting edema Pulses: 2+ and symmetric Skin: pale, warm, dry Neurologic: Grossly normal Psych: Pleasant   Labs    Chemistry Recent Labs  Lab 11/24/17 1341  11/27/17 0348 11/28/17 0402 11/29/17 0348  NA 123*   < > 127* 131* 134*  K 4.1   < > 4.2 3.9 3.8  CL 80*   < > 84* 87* 88*  CO2 32   < > 30 32 35*  GLUCOSE 112*   < > 97 103* 105*  BUN 33*   < > 38* 36* 33*  CREATININE 1.02   < > 1.44* 1.26* 1.22  CALCIUM 8.1*   < > 8.4* 8.6* 8.7*  PROT 6.1*  --   --   --   --   ALBUMIN 2.4*  --   --   --   --   AST 27  --   --   --   --   ALT 31  --   --   --   --   ALKPHOS 105  --   --   --   --   BILITOT 0.8  --   --   --   --   GFRNONAA >60   < > 44* 51* 53*  GFRAA >60   < > 51*  60* >60  ANIONGAP 11   < > 13 12 11    < > = values in this interval not displayed.     Hematology Recent Labs  Lab 11/24/17 1341 11/25/17 0417  WBC 11.7* 11.5*  RBC 3.82* 3.68*  HGB 11.3* 10.7*  HCT 33.6* 32.6*  MCV 88.0 88.6  MCH 29.6 29.1  MCHC 33.6 32.8  RDW 15.4 15.6*  PLT 196 203    Cardiac EnzymesNo results for input(s): TROPONINI in the last 168 hours.  Recent Labs  Lab 11/24/17 1353  TROPIPOC 0.00     BNP Recent Labs  Lab 11/24/17 1341  BNP 810.6*     DDimer No results for input(s): DDIMER in the last 168 hours.   Radiology    No results found.  Cardiac Studies   Echo 11/18/17- Study Conclusions  - Left ventricle: The cavity size was normal. Wall thickness was normal. Systolic function was moderately reduced. The estimated ejection fraction was in the range of 35% to 40%. Features are consistent with a pseudonormal left ventricular filling pattern, with concomitant abnormal relaxation and increased filling pressure (grade 2 diastolic dysfunction). - Aortic valve: Mildly to moderately calcified annulus. Moderately thickened, moderately calcified leaflets. There was mild regurgitation. - Mitral valve: Mildly  calcified annulus. - Right ventricle: The cavity size was mildly dilated. Systolic function was mildly reduced. - Pulmonary arteries: Systolic pressure was moderately increased. PA peak pressure: 45 mm Hg (S).   Patient Profile     84 y.o. male with locally advanced and metastatic adenocarcinoma of the lung, readmitted with shortness of breath after recent hospitalization for new onset systolic heart failure (EF 35-40%), high level suspicion for underlying coronary artery disease with conservative management due to advanced malignancy.  Also has hyponatremia at least in part due to SIADH, uncertain contribution of heart failure and acute on chronic renal insufficiency.  Rison to switch to 80 mg po lasix daily starting tomorrow (received IV lasix today) - follow-up with Dr. Irish Lack after discharge. Cardiology will sign-off. Call with questions.  For questions or updates, please contact [place] Please consult [email or web address] for contact info under Cardiology/STEMI.   Pixie Casino, MD, [facility], [place] Director of the Advanced Lipid Disorders &  Cardiovascular Risk Reduction Clinic Diplomate of the American Board of Clinical Lipidology Attending Cardiologist  Direct Dial: [phone]  Fax: [phone]  Website:  [email or web address]  Pixie Casino, MD  11/29/2017, 12:33 PM

## 2017-11-29 NOTE — Progress Notes (Signed)
   Subjective: Patient was feeling better this morning.  He was waiting for cardiology to come by and make decision regarding his diuresis and possible discharge.  Objective:  Vital signs in last 24 hours: Vitals:   11/28/17 0511 11/28/17 1221 11/28/17 2104 11/29/17 0615  BP: 128/67 129/65 (!) 158/87 (!) 158/88  Pulse: 88 80 80 86  Resp: 18 20 20 20   Temp: 97.9 F (36.6 C) (!) 97.5 F (36.4 C) 97.6 F (36.4 C) (!) 97.4 F (36.3 C)  TempSrc: Oral Oral Oral Oral  SpO2: 93% 100% 100% 97%  Weight: 173 lb 8 oz (78.7 kg)   170 lb 14.4 oz (77.5 kg)  Height:       Gen. well-developed elderly man, lying in his bed comfortably. Lungs.  Decreased breath sounds bilaterally, seems little improved as compared to yesterday. Abdomen.  Soft, nontender, nondistended, bowel sounds positive. Extremities.1- 2+ lower extremity edema bilaterally.  Assessment/Plan:  84 year old gentleman with stage IV lung cancer and heart failure.  Acute on chronic HFrEF with hypoxemia.  Saturating well on 2 L.  Kidney function continued to improve with creatinine at 1.22 today.  Mild elevation of bicarb. -Looks like he is close to his dry weight with mild contraction alkalosis. -Waiting for heart failure team recommendations regarding continuation of IV versus p.o. Diuresis. -Patient can be discharged if it is safe for him to start on p.o. Dose. -Continue low-dose carvedilol.  Radiation Pneumonitis Last radiation treatment 05/2017, Likely contributing to patient's shortness of breath -Prednisone 40 mg daily( 3rd day), for 2 weeks with a taper following.  Hyponatremia 2/2 hypervolemia w/ HFrEF and likely SIADH.   Sodium continued to improve with fluid restriction and diuresis, it was 134 today.  Continue fluid restriction and diuresis.  Metastatic Lung Adenocarcinoma.  Patient and family want to continue chemotherapy-currently deferred 1 week-pending improvement in his heart failure.  Paraseptal  emphysema. -Continue scheduled DuoNeb every 6 hourly  Dispo: Anticipated discharge in approximately 0-1 day(s).   Lorella Nimrod, MD 11/29/2017, 11:23 AM Pager: [phone]

## 2017-11-29 NOTE — Progress Notes (Signed)
Patient and family given discharge instructions and all questions answered.

## 2017-11-29 NOTE — Progress Notes (Signed)
  Date: 11/29/2017  Patient name: Harry Hinton  Medical record number: [id number]  Date of birth: 05-Jun-1933   I have seen and evaluated this patient and I have discussed the plan of care with the house staff. Please see Dr. Latina Craver note for complete details. I concur with her findings.  Plan for discharge today.  He will need taper of his prednisone for radiation pneumonitis and good follow up plan.   Sid Falcon, MD 11/29/2017, 6:04 PM

## 2017-11-29 NOTE — Discharge Summary (Signed)
Name: Harry Hinton MRN: [id number] DOB: [birth year] 84 y.o. PCP: Myrlene Broker, MD  Date of Admission: 11/24/2017  1:06 PM Date of Discharge:  Attending Physician: Oval Linsey, MD  Discharge Diagnosis: 1.  Acute on chronic HFrEF. 2.  Radiation pneumonitis.  Active Problems:   Essential hypertension   Goals of care, counseling/discussion   Acute on chronic combined systolic and diastolic CHF (congestive heart failure) (H[facility])   Metastatic non-small cell lung cancer (H[facility])   Acute on chronic heart failure (H[facility])   SIADH (syndrome of inappropriate ADH production) (H[facility])   Pneumonitis, radiation ([place])   Acute on chronic respiratory failure ([place])   Palliative care by specialist   Cardiomyopathy- etiology not yet determined   Acute renal insufficiency   Acute on chronic congestive heart failure [facility])   Discharge Medications: Allergies as of 11/29/2017      Reactions   Chlorhexidine Itching, Rash      Medication List    TAKE these medications   albuterol 108 (90 Base) MCG/ACT inhaler Commonly known as:  PROVENTIL HFA;VENTOLIN HFA Inhale 2 puffs into the lungs every 6 (six) hours as needed for wheezing or shortness of breath.   albuterol (2.5 MG/3ML) 0.083% nebulizer solution Commonly known as:  PROVENTIL Take 3 mLs (2.5 mg total) by nebulization every 6 (six) hours as needed for wheezing or shortness of breath.   aspirin EC 81 MG tablet Take 81 mg by mouth at bedtime.   carvedilol 3.125 MG tablet Commonly known as:  COREG Take 1 tablet (3.125 mg total) by mouth 2 (two) times daily with a meal.   dronabinol 2.5 MG capsule Commonly known as:  MARINOL Take 1 capsule (2.5 mg total) by mouth 2 (two) times daily before lunch and supper.   furosemide 80 MG tablet Commonly known as:  LASIX Take 1 tablet (80 mg total) by mouth daily. Start taking on:  11/30/2017 What changed:    medication strength  how much to take  when to take this   multivitamin with minerals  Tabs tablet Take 1 tablet by mouth daily. Centrum Silver   predniSONE 20 MG tablet Commonly known as:  DELTASONE Take 2 tablets (40 mg total) by mouth daily. Start taking on:  11/30/2017       Disposition and follow-up:   Mr.Fabiano Hehir was discharged from [facility] in Good condition.  At the hospital follow up visit please address:  1.  His volume status and saturation. 2.  He was discharged on prednisone 50 mg daily for 2 weeks-will need to taper afterwards.  2.  Labs / imaging needed at time of follow-up: BMP  3.  Pending labs/ test needing follow-up: None  Follow-up Appointments:   Hospital Course by problem list:  Acute on chronic HFrEF with hypoxemia.  84 year old gentleman with history significant for stage IV lung cancer and heart failure, readmitted because of worsening dyspnea and increase in weight.  He was discharged 2 days ago and was diagnosed with HFrEF during that hospitalization, he was diuresed during that time and sent home on Lasix, patient was drinking more fluid than directed, most likely due to some miss understanding about what should be included in fluid intake.  During current hospitalization he was diuresed with Lasix 80 mg IV twice daily, initially he had worsening of his renal function, with continuation of diuresis they started improving, on the day of discharge he was having mild contraction alkalosis.  His symptoms improved and he was saturating well  on 2 L.  He was discharged on p.o. Lasix 80 mg daily with instructions to weigh himself every day and if there is a weight gain of 3 pounds in 1 day or 5 pound in 1 week he should give himself an extra dose of Lasix.  He will follow-up with his cardiologist.  Chronic respiratory failure.   Most likely multifactorial due to his underlying lung disease with heart failure.  He was discharged home with 2-3 L of continuous oxygen.    Radiation Pneumonitis Last radiation treatment 05/2017,  Likely contributing to patient's shortness of breath.  He was started on prednisone 40 mg daily for 2 weeks and will need a taper afterwards.  He will follow-up with his oncologist.  Hyponatremia 2/2 hypervolemia w/ HFrEF and likely SIADH.  His sodium continued to improve with diuresis and fluid restriction, it was 134 on the day of discharge.   Metastatic Lung Adenocarcinoma. Patient and family want to continue chemotherapy-currently deferred 1 week-pending improvement in his heart failure.  His oncologist will decide about further plan regarding his cancer treatment.  Discharge Vitals:   BP (!) 158/88 (BP Location: Left Arm)   Pulse 86   Temp (!) 97.4 F (36.3 C) (Oral)   Resp 20   Ht 6' (1.829 m)   Wt 170 lb 14.4 oz (77.5 kg)   SpO2 97%   BMI 23.18 kg/m   Gen. well-developed elderly man, lying in his bed comfortably. Lungs.  Decreased breath sounds bilaterally, seems little improved as compared to yesterday. Abdomen.Soft, nontender, nondistended, bowel sounds positive. Extremities.1-2+ lower extremity edema bilaterally  Pertinent Labs, Studies, and Procedures:  CBC Latest Ref Rng & Units 11/25/2017 11/24/2017 11/19/2017  WBC 4.0 - 10.5 K/uL 11.5(H) 11.7(H) 12.3(H)  Hemoglobin 13.0 - 17.0 g/dL 10.7(L) 11.3(L) 11.5(L)  Hematocrit 39.0 - 52.0 % 32.6(L) 33.6(L) 34.3(L)  Platelets 150 - 400 K/uL 203 196 232   Recent Labs  Lab 11/25/17 1621 11/26/17 0459 11/27/17 0348 11/28/17 0402 11/29/17 0348  NA 124* 127* 127* 131* 134*  K 4.0 4.1 4.2 3.9 3.8  CL 81* 85* 84* 87* 88*  CO2 31 31 30  32 35*  GLUCOSE 111* 96 97 103* 105*  BUN 35* 34* 38* 36* 33*  CREATININE 1.35* 1.42* 1.44* 1.26* 1.22  CALCIUM 8.2* 8.1* 8.4* 8.6* 8.7*    Discharge Instructions: Discharge Instructions    Diet - low sodium heart healthy   Complete by:  As directed    Diet - low sodium heart healthy   Complete by:  As directed    Discharge instructions   Complete by:  As directed    It was  pleasure taking care of you. As we discussed now you will take 80 mg of Lasix daily.  You can take up to 1 L of total fluid daily. Please weigh yourself every day and if you say with a weight gain of 3 pound in 1 day or 5 pound in 1 week, take an extra dose of Lasix. Continue using oxygen at 2-3 L at home all the time. Continue taking prednisone 50 mg daily for a total of 2 weeks, then your oncologist should be able to taper off your dose. Please make an appointment with your cardiologist. You should go see your oncologist according to your scheduled appointment.   Increase activity slowly   Complete by:  As directed    Increase activity slowly   Complete by:  As directed       Signed: Reesa Chew,  Soundra Pilon, MD 11/29/2017, 1:36 PM   Pager: [phone]

## 2017-11-30 ENCOUNTER — Inpatient Hospital Stay: Payer: Medicare Other

## 2017-11-30 ENCOUNTER — Inpatient Hospital Stay: Payer: Medicare Other | Admitting: Hematology & Oncology

## 2017-11-30 NOTE — Telephone Encounter (Addendum)
Spoke with pt wife, she reports they are overwhelmed at this point and would like for [place] to call back tomorrow. By then she will be able to talk and have clear questions. She would like for [place] to call back tomorrow.

## 2017-12-01 ENCOUNTER — Telehealth: Payer: Self-pay

## 2017-12-01 ENCOUNTER — Ambulatory Visit: Payer: Medicare Other | Admitting: Cardiology

## 2017-12-01 NOTE — Telephone Encounter (Signed)
I'd be glad to help, but please clarify if he went home with palliative/hospice care.  It will be better to coordinate those orders through them if that is the case.

## 2017-12-01 NOTE — Telephone Encounter (Addendum)
Spoke to patient's daughter Chong Sicilian earlier.Advised father needs to be under palliative/hospice care.She will call PCP to obtain order.

## 2017-12-01 NOTE — Telephone Encounter (Signed)
Spoke to patient's daughter Chong Sicilian.She stated father is not under pallative/hospice care.

## 2017-12-01 NOTE — Telephone Encounter (Signed)
Spoke to patient's daughter Chong Sicilian.Dr.Croitoru advised father needs to be under palliative/hospice care.Advised to call PCP for order.

## 2017-12-01 NOTE — Telephone Encounter (Signed)
Then we need documentation of room air O2 sat<88%. MCr

## 2017-12-01 NOTE — Telephone Encounter (Signed)
New message  Pt daughter verbalized that she is calling for the RN  She wants a home health rn to come out to the house for her father   Check fluids, help mother with Oxygen, physical therapy, nutrition care ect   Pt having trouble getting in and out of bed

## 2017-12-01 NOTE — Telephone Encounter (Signed)
Returned call to patient's daughter Chong Sicilian.She stated father recently discharged from hospital.Stated he needs a new order sent to [facility] for nurse to evaluate his needs.He needs help with O2, nutritional counseling,PT.Advised I will send message to Dr.Croitoru for a order.

## 2017-12-03 ENCOUNTER — Encounter: Payer: Self-pay | Admitting: Cardiology

## 2017-12-03 ENCOUNTER — Ambulatory Visit: Payer: Medicare Other | Admitting: Cardiology

## 2017-12-03 DIAGNOSIS — C3490 Malignant neoplasm of unspecified part of unspecified bronchus or lung: Secondary | ICD-10-CM

## 2017-12-03 DIAGNOSIS — I10 Essential (primary) hypertension: Secondary | ICD-10-CM | POA: Diagnosis not present

## 2017-12-03 DIAGNOSIS — N289 Disorder of kidney and ureter, unspecified: Secondary | ICD-10-CM | POA: Diagnosis not present

## 2017-12-03 DIAGNOSIS — I5043 Acute on chronic combined systolic (congestive) and diastolic (congestive) heart failure: Secondary | ICD-10-CM

## 2017-12-03 DIAGNOSIS — I429 Cardiomyopathy, unspecified: Secondary | ICD-10-CM

## 2017-12-03 NOTE — Assessment & Plan Note (Signed)
EF 35-40% w/ G2DD on 11/18/17 echo

## 2017-12-03 NOTE — Patient Instructions (Signed)
Medication Instructions:  Continue current medications  If you need a refill on your cardiac medications before your next appointment, please call your pharmacy.  Labwork: None Ordered   Testing/Procedures: None Ordered  Follow-Up: Your physician wants you to follow-up in: 6 Weeks with Dr Irish Lack.    Thank you for choosing [facility]!!

## 2017-12-03 NOTE — Assessment & Plan Note (Signed)
Most recent SCr 1.22

## 2017-12-03 NOTE — Assessment & Plan Note (Signed)
Followed by Dr Marin Olp. Pt is to start another round of chemotherapy

## 2017-12-03 NOTE — Assessment & Plan Note (Signed)
Hypotensive now

## 2017-12-03 NOTE — Assessment & Plan Note (Signed)
Pt was hospitalized 2/6-2/17/19 with CHF, new LVD Currently stable- it appears dry wgt is around 165 lbs

## 2017-12-03 NOTE — Progress Notes (Signed)
12/03/2017 Harry Hinton   30-Dec-1932  [id number]  Primary Physician Myrlene Broker, MD Primary Cardiologist: Dr Sallyanne Kuster  HPI:  84 y.o. malewith non-small cell lung CA with metastases to the liver, being treated with chemo and radiation, h/o HTN, tobacco abuse. He was initially evaluated by cardiology 11/18/17 for acute CHF. He was noted to have new LV dysfunction (EF 35-40%, 2DD). He was treated with diuretics and discharged 11/22/17. His hospital course also notable for hyponatremia w/ suspected element of SIADH. He presented back to the ED on 11/24/17 with recurrent acute CHF. He was diuresed further, DC weight 168 lbs. He is sen in the office today for TOC follow up. He is accompanied by his daughter and wife. His wife has been keeping track of his weight at home and the pt appears to be holding steady at 163-165 lbs. She is carefully monitoring his fluid intake. The pt is now on home O2. He says he is doing well and the family confirms he is much improved.    Current Outpatient Medications  Medication Sig Dispense Refill  . albuterol (PROVENTIL HFA;VENTOLIN HFA) 108 (90 Base) MCG/ACT inhaler Inhale 2 puffs into the lungs every 6 (six) hours as needed for wheezing or shortness of breath. 1 Inhaler 2  . albuterol (PROVENTIL) (2.5 MG/3ML) 0.083% nebulizer solution Take 3 mLs (2.5 mg total) by nebulization every 6 (six) hours as needed for wheezing or shortness of breath. 75 mL 12  . aspirin EC 81 MG tablet Take 81 mg by mouth at bedtime.     . carvedilol (COREG) 3.125 MG tablet Take 1 tablet (3.125 mg total) by mouth 2 (two) times daily with a meal. 60 tablet 0  . dronabinol (MARINOL) 2.5 MG capsule Take 1 capsule (2.5 mg total) by mouth 2 (two) times daily before lunch and supper. 60 capsule 0  . furosemide (LASIX) 80 MG tablet Take 1 tablet (80 mg total) by mouth daily. 30 tablet 0  . Multiple Vitamin (MULTIVITAMIN WITH MINERALS) TABS tablet Take 1 tablet by mouth daily. Centrum Silver      . predniSONE (DELTASONE) 20 MG tablet Take 2 tablets (40 mg total) by mouth daily. 30 tablet 0   No current facility-administered medications for this visit.    Facility-Administered Medications Ordered in Other Visits  Medication Dose Route Frequency Provider Last Rate Last Dose  . sodium chloride flush (NS) 0.9 % injection 10 mL  10 mL Intravenous PRN [place], Holli Humbles, NP   10 mL at 11/13/17 1456    Allergies  Allergen Reactions  . Chlorhexidine Itching and Rash    Past Medical History:  Diagnosis Date  . Adenocarcinoma of lung metastatic to liver, right ([place]) 11/13/2017  . AKI (acute kidney injury) ([place]) 03/10/2017   NOTED ON ECHO  . Anemia    history of  . Aortic valve sclerosis 03/09/2017   NOTED ON ECHO  . CHF (congestive heart failure) ([place]) 11/24/2017  . Counseling regarding goals of care 03/26/2017  . Dyspnea   . GERD (gastroesophageal reflux disease)    "only when in chemo/radiation" (11/25/2017)  . Hearing loss   . History of blood transfusion    "@ least 3; all related to low counts S/P radiation" (11/25/2017)  . Hypertension   . Iron deficiency anemia    "he's had 2 iron transfusions" (11/25/2017)  . Lung cancer, primary, with metastasis from lung to other site, right ([place]) 11/13/2017    left lung CA travelled to his liver" (  11/25/2017)  . LVH (left ventricular hypertrophy) 03/10/2017   mild, NOTED ON ECHO  . Migraine    "none since his 30's" (11/25/2017)  . MR (mitral regurgitation)    trivial, NOTED ON ECHO  . On home oxygen therapy    "2L; 24/7" (11/25/2017)  . Pneumonia ?10/2017   "vs bronchitis"  . Port-A-Cath in place   . Sinus problem   . Tingling    right foot    Social History   Socioeconomic History  . Marital status: Married    Spouse name: Not on file  . Number of children: Not on file  . Years of education: Not on file  . Highest education level: Not on file  Social Needs  . Financial resource strain: Not on file  . Food insecurity -  worry: Not on file  . Food insecurity - inability: Not on file  . Transportation needs - medical: Not on file  . Transportation needs - non-medical: Not on file  Occupational History  . Not on file  Tobacco Use  . Smoking status: Former Smoker    Packs/day: 0.50    Years: 36.00    Pack years: 18.00    Types: Cigarettes    Last attempt to quit: 03/10/1987    Years since quitting: 30.7  . Smokeless tobacco: Never Used  Substance and Sexual Activity  . Alcohol use: Yes    Comment: 11/25/2017 "glass of wine a couple times/year"  . Drug use: No  . Sexual activity: Not Currently  Other Topics Concern  . Not on file  Social History Narrative  . Not on file     Family History  Problem Relation Age of Onset  . Heart attack Father 57  . Congestive Heart Failure Father      Review of Systems: General: negative for chills, fever, night sweats or weight changes.  Cardiovascular: negative for chest pain, dyspnea on exertion, edema, orthopnea, palpitations, paroxysmal nocturnal dyspnea or shortness of breath Dermatological: negative for rash Respiratory: negative for cough or wheezing Urologic: negative for hematuria Abdominal: negative for nausea, vomiting, diarrhea, bright red blood per rectum, melena, or hematemesis Neurologic: negative for visual changes, syncope, or dizziness All other systems reviewed and are otherwise negative except as noted above.    Blood pressure 105/61, pulse 96, height 6' (1.829 m), weight 168 lb 6.4 oz (76.4 kg).  General appearance: alert, cooperative, no distress and in wheelchair, on O2 Neck: no carotid bruit and no JVD Lungs: clear on Rt, rub Lt base Heart: regular rate and rhythm and extra systole on exam Extremities: no edema Skin: pale, dry Neurologic: Grossly normal   ASSESSMENT AND PLAN:   Acute on chronic combined systolic and diastolic CHF (congestive heart failure) (HCC) Pt was hospitalized 2/6-2/17/19 with CHF, new LVD Currently  stable- it appears dry wgt is around 165 lbs   Cardiomyopathy- etiology not yet determined EF 35-40% w/ G2DD on 11/18/17 echo  Metastatic non-small cell lung cancer ([place]) Followed by Dr Marin Olp. Pt is to start another round of chemotherapy  Essential hypertension Hypotensive now  Acute renal insufficiency Most recent SCr 1.22   PLAN  Same cardiac Rx for now. His B/P is too low to try and increase his Coreg or add an ARB or Hydralazine/ Nitrate for CHF. The family tells me he is scheduled to undergo another round of chemotherapy in the next few weeks. I asked them to contact us if his wgt dropped below 160 lbs or went to 170  lbs. F/U with cardiology in 6 weeks. Dr Sallyanne Kuster will follow the patient.  Kerin Ransom PA-C 12/03/2017 8:54 AM

## 2017-12-07 ENCOUNTER — Inpatient Hospital Stay: Payer: Medicare Other

## 2017-12-07 ENCOUNTER — Inpatient Hospital Stay (HOSPITAL_BASED_OUTPATIENT_CLINIC_OR_DEPARTMENT_OTHER): Payer: Medicare Other | Admitting: Hematology & Oncology

## 2017-12-07 ENCOUNTER — Other Ambulatory Visit: Payer: Self-pay

## 2017-12-07 ENCOUNTER — Other Ambulatory Visit: Payer: Self-pay | Admitting: *Deleted

## 2017-12-07 ENCOUNTER — Encounter: Payer: Self-pay | Admitting: Cardiology

## 2017-12-07 ENCOUNTER — Telehealth: Payer: Self-pay | Admitting: *Deleted

## 2017-12-07 ENCOUNTER — Ambulatory Visit (INDEPENDENT_AMBULATORY_CARE_PROVIDER_SITE_OTHER): Payer: Medicare Other | Admitting: Cardiology

## 2017-12-07 ENCOUNTER — Other Ambulatory Visit (HOSPITAL_BASED_OUTPATIENT_CLINIC_OR_DEPARTMENT_OTHER): Payer: Self-pay

## 2017-12-07 VITALS — BP 116/60 | HR 55 | Ht 70.0 in | Wt 168.0 lb

## 2017-12-07 VITALS — BP 101/58 | HR 52 | Temp 97.9°F | Resp 16 | Wt 168.0 lb

## 2017-12-07 DIAGNOSIS — C3491 Malignant neoplasm of unspecified part of right bronchus or lung: Secondary | ICD-10-CM

## 2017-12-07 DIAGNOSIS — I471 Supraventricular tachycardia: Secondary | ICD-10-CM

## 2017-12-07 DIAGNOSIS — I5043 Acute on chronic combined systolic (congestive) and diastolic (congestive) heart failure: Secondary | ICD-10-CM

## 2017-12-07 DIAGNOSIS — C3402 Malignant neoplasm of left main bronchus: Secondary | ICD-10-CM

## 2017-12-07 DIAGNOSIS — R0602 Shortness of breath: Secondary | ICD-10-CM | POA: Diagnosis not present

## 2017-12-07 DIAGNOSIS — C3492 Malignant neoplasm of unspecified part of left bronchus or lung: Secondary | ICD-10-CM | POA: Diagnosis not present

## 2017-12-07 DIAGNOSIS — I429 Cardiomyopathy, unspecified: Secondary | ICD-10-CM | POA: Diagnosis not present

## 2017-12-07 DIAGNOSIS — M7989 Other specified soft tissue disorders: Secondary | ICD-10-CM | POA: Diagnosis not present

## 2017-12-07 DIAGNOSIS — D509 Iron deficiency anemia, unspecified: Secondary | ICD-10-CM | POA: Diagnosis not present

## 2017-12-07 DIAGNOSIS — I10 Essential (primary) hypertension: Secondary | ICD-10-CM

## 2017-12-07 DIAGNOSIS — Z5112 Encounter for antineoplastic immunotherapy: Secondary | ICD-10-CM | POA: Diagnosis not present

## 2017-12-07 DIAGNOSIS — Z5111 Encounter for antineoplastic chemotherapy: Secondary | ICD-10-CM | POA: Diagnosis present

## 2017-12-07 DIAGNOSIS — R531 Weakness: Secondary | ICD-10-CM | POA: Diagnosis not present

## 2017-12-07 DIAGNOSIS — I4719 Other supraventricular tachycardia: Secondary | ICD-10-CM | POA: Insufficient documentation

## 2017-12-07 DIAGNOSIS — D500 Iron deficiency anemia secondary to blood loss (chronic): Secondary | ICD-10-CM

## 2017-12-07 DIAGNOSIS — C787 Secondary malignant neoplasm of liver and intrahepatic bile duct: Secondary | ICD-10-CM

## 2017-12-07 LAB — CMP (CANCER CENTER ONLY)
ALT: 77 U/L — AB (ref 10–47)
ANION GAP: 15 (ref 5–15)
AST: 56 U/L — AB (ref 11–38)
Albumin: 2.5 g/dL — ABNORMAL LOW (ref 3.5–5.0)
Alkaline Phosphatase: 132 U/L — ABNORMAL HIGH (ref 26–84)
BUN: 39 mg/dL — ABNORMAL HIGH (ref 7–22)
CALCIUM: 8.9 mg/dL (ref 8.0–10.3)
CO2: 37 mmol/L — ABNORMAL HIGH (ref 18–33)
Chloride: 86 mmol/L — ABNORMAL LOW (ref 98–108)
Creatinine: 1.2 mg/dL (ref 0.60–1.20)
GLUCOSE: 188 mg/dL — AB (ref 73–118)
Potassium: 2.9 mmol/L — CL (ref 3.3–4.7)
Sodium: 138 mmol/L (ref 128–145)
TOTAL PROTEIN: 6.6 g/dL (ref 6.4–8.1)
Total Bilirubin: 0.6 mg/dL (ref 0.2–1.6)

## 2017-12-07 LAB — CBC WITH DIFFERENTIAL (CANCER CENTER ONLY)
BASOS ABS: 0 10*3/uL (ref 0.0–0.1)
BASOS PCT: 0 %
Eosinophils Absolute: 0 10*3/uL (ref 0.0–0.5)
Eosinophils Relative: 0 %
HEMATOCRIT: 32.8 % — AB (ref 38.7–49.9)
Hemoglobin: 10.4 g/dL — ABNORMAL LOW (ref 13.0–17.1)
Lymphocytes Relative: 4 %
Lymphs Abs: 0.4 10*3/uL — ABNORMAL LOW (ref 0.9–3.3)
MCH: 30 pg (ref 28.0–33.4)
MCHC: 31.7 g/dL — ABNORMAL LOW (ref 32.0–35.9)
MCV: 94.5 fL (ref 82.0–98.0)
MONO ABS: 0.3 10*3/uL (ref 0.1–0.9)
Monocytes Relative: 3 %
NEUTROS ABS: 8.9 10*3/uL — AB (ref 1.5–6.5)
Neutrophils Relative %: 93 %
Platelet Count: 187 10*3/uL (ref 145–400)
RBC: 3.47 MIL/uL — ABNORMAL LOW (ref 4.20–5.70)
RDW: 16 % — AB (ref 11.1–15.7)
WBC Count: 9.5 10*3/uL (ref 4.0–10.0)

## 2017-12-07 LAB — IRON AND TIBC
IRON: 34 ug/dL — AB (ref 42–163)
Saturation Ratios: 22 % — ABNORMAL LOW (ref 42–163)
TIBC: 154 ug/dL — ABNORMAL LOW (ref 202–409)
UIBC: 119 ug/dL

## 2017-12-07 LAB — FERRITIN: Ferritin: 3860 ng/mL — ABNORMAL HIGH (ref 22–316)

## 2017-12-07 LAB — TSH: TSH: 0.818 u[IU]/mL (ref 0.320–4.118)

## 2017-12-07 MED ORDER — POTASSIUM CHLORIDE CRYS ER 20 MEQ PO TBCR
40.0000 meq | EXTENDED_RELEASE_TABLET | Freq: Once | ORAL | Status: AC
  Administered 2017-12-07: 40 meq via ORAL
  Filled 2017-12-07: qty 2

## 2017-12-07 MED ORDER — PREDNISONE 20 MG PO TABS: 20.0000 mg | ORAL_TABLET | Freq: Every day | ORAL | 0 refills | Status: AC

## 2017-12-07 MED ORDER — DRONABINOL 2.5 MG PO CAPS: 2.5500 mg | ORAL_CAPSULE | Freq: Two times a day (BID) | ORAL | 0 refills | Status: AC

## 2017-12-07 MED ORDER — POTASSIUM CHLORIDE CRYS ER 20 MEQ PO TBCR: 40.0000 meq | EXTENDED_RELEASE_TABLET | Freq: Two times a day (BID) | ORAL | 4 refills | Status: AC

## 2017-12-07 NOTE — Progress Notes (Signed)
Cardiology Office Note:    Date:  12/07/2017   ID:  Harry Hinton, DOB 09-13-1933, MRN [id number]  PCP:  Myrlene Broker, MD  Cardiologist:  Jenne Campus, MD    Referring MD: Myrlene Broker, MD   Chief Complaint  Patient presents with  . Atrial Fibrillation  I came for chemotherapy  History of Present Illness:    Harry Hinton is a 84 y.o. male with history of lung cancer.  He came today to oncologist to reinitiate chemotherapy.  However he was fine tachycardic and he was sent to [place] for evaluation.  Apparently echocardiogram was done in May of last year showing normal left ventricular ejection fraction however did echocardiogram was repeated just few weeks ago when he was fine to have deterioration of left ventricular ejection fraction to 35-40%.  Since that time he ended up going to hospital twice because of decompensated congestive heart failure.  He was also told to have atrial fibrillation.  Today's EKG was interpreted as atrial fibrillation but careful analysis of this EKG showed presence of multifocal atrial tachycardia there is clearly organized atrial activity best visible in lead III.  His heart rate was about 118-124.  And that was the reason why he did not get his chemotherapy today.  His family is with him and his wife and his daughter and they are very concerned about his status.  They watch him very carefully including weight which been stable within last few days.  He takes high dose of diuretic 80 mg of furosemide every single day there was no potassium supplementation and potassium checked just few days ago was only 2.5.  He was given potassium supplementation now and we are waiting for results of his Chem-7 which will be repeated tomorrow.  He is already on blocker which I will continue.  Careful review of his EKGs from before show presence of first-degree AV block and baseline EKG.  Past Medical History:  Diagnosis Date  . Adenocarcinoma of lung metastatic to liver,  right ([facility] [facility]) 11/13/2017  . AKI (acute kidney injury) ([facility]) 03/10/2017   NOTED ON ECHO  . Anemia    history of  . Aortic valve sclerosis 03/09/2017   NOTED ON ECHO  . CHF (congestive heart failure) ([facility]) 11/24/2017  . Counseling regarding goals of care 03/26/2017  . Dyspnea   . GERD (gastroesophageal reflux disease)    "only when in chemo/radiation" (11/25/2017)  . Hearing loss   . History of blood transfusion    "@ least 3; all related to low counts S/P radiation" (11/25/2017)  . Hypertension   . Iron deficiency anemia    "he's had 2 iron transfusions" (11/25/2017)  . Lung cancer, primary, with metastasis from lung to other site, right ([facility]) 11/13/2017    left lung CA travelled to his liver" (11/25/2017)  . LVH (left ventricular hypertrophy) 03/10/2017   mild, NOTED ON ECHO  . Migraine    "none since his 30's" (11/25/2017)  . MR (mitral regurgitation)    trivial, NOTED ON ECHO  . On home oxygen therapy    "2L; 24/7" (11/25/2017)  . Pneumonia ?10/2017   "vs bronchitis"  . Port-A-Cath in place   . Sinus problem   . Tingling    right foot    Past Surgical History:  Procedure Laterality Date  . CATARACT EXTRACTION W/ INTRAOCULAR LENS  IMPLANT, BILATERAL Bilateral ~ 2016  . COLONOSCOPY    . IR FLUORO GUIDE PORT INSERTION RIGHT  04/06/2017  . IR  RADIOLOGIST EVAL & MGMT  09/30/2017  . IR US GUIDE VASC ACCESS RIGHT  04/06/2017  . LIVER BIOPSY    . RADIOLOGY WITH ANESTHESIA N/A 11/06/2017   Procedure: CT MICROWAVE THERMAL ABLATION - LIVER;  Surgeon: Aletta Edouard, MD;  Location: [facility];  Service: Radiology;  Laterality: N/A;  . TONSILLECTOMY     age 39  . VASECTOMY    . VIDEO BRONCHOSCOPY WITH ENDOBRONCHIAL ULTRASOUND  03/11/2017   Procedure: VIDEO BRONCHOSCOPY WITH ENDOBRONCHIAL ULTRASOUND;  Surgeon: Collene Gobble, MD;  Location: [facility] OR;  Service: Thoracic;;    Current Medications: No outpatient medications have been marked as taking for the 12/07/17 encounter (Office Visit) with  Park Liter, MD.     Allergies:   Chlorhexidine   Social History   Socioeconomic History  . Marital status: Married    Spouse name: None  . Number of children: None  . Years of education: None  . Highest education level: None  Social Needs  . Financial resource strain: None  . Food insecurity - worry: None  . Food insecurity - inability: None  . Transportation needs - medical: None  . Transportation needs - non-medical: None  Occupational History  . None  Tobacco Use  . Smoking status: Former Smoker    Packs/day: 0.50    Years: 36.00    Pack years: 18.00    Types: Cigarettes    Last attempt to quit: 03/10/1987    Years since quitting: 30.7  . Smokeless tobacco: Never Used  Substance and Sexual Activity  . Alcohol use: Yes    Comment: 11/25/2017 "glass of wine a couple times/year"  . Drug use: No  . Sexual activity: Not Currently  Other Topics Concern  . None  Social History Narrative  . None     Family History: The patient's family history includes Congestive Heart Failure in his father; Heart attack (age of onset: 72) in his father. ROS:   Please see the history of present illness.    All 14 point review of systems negative except as described per history of present illness  EKGs/Labs/Other Studies Reviewed:      Recent Labs: 03/11/2017: Magnesium 2.0 11/24/2017: B Natriuretic Peptide 810.6 11/25/2017: Hemoglobin 10.7 12/07/2017: ALT 77; BUN 39; Creatinine 1.20; Platelet Count 187; Potassium 2.9; Sodium 138; TSH 0.818  Recent Lipid Panel No results found for: CHOL, TRIG, HDL, CHOLHDL, VLDL, LDLCALC, LDLDIRECT  Physical Exam:    VS:  BP 116/60   Pulse (!) 55   Ht 5\' 10"  (1.778 m)   Wt 168 lb (76.2 kg)   SpO2 (!) 86% Comment: 3 lt of O2  BMI 24.11 kg/m     Wt Readings from Last 3 Encounters:  12/07/17 168 lb (76.2 kg)  12/07/17 168 lb (76.2 kg)  12/03/17 168 lb 6.4 oz (76.4 kg)     GEN:  Well nourished, well developed in no acute  distress HEENT: Normal NECK: No JVD; No carotid bruits LYMPHATICS: No lymphadenopathy CARDIAC: RRR, no murmurs, no rubs, no gallops RESPIRATORY:  Clear to auscultation without rales, wheezing or rhonchi  ABDOMEN: Soft, non-tender, non-distended MUSCULOSKELETAL:  No edema; No deformity  SKIN: Warm and dry LOWER EXTREMITIES: no swelling NEUROLOGIC:  Alert and oriented x 3 PSYCHIATRIC:  Normal affect   ASSESSMENT:    1. Acute on chronic combined systolic and diastolic congestive heart failure ([place])   2. Essential hypertension   3. Cardiomyopathy, unspecified type ([place])   4. Lung cancer, primary, with metastasis  from lung to other site, right ([place])   5. Multifocal atrial tachycardia (HCC)    PLAN:    In order of problems listed above:  1. Congestive heart failure: On appropriate medications.  In the future I hope to be able to increase dose of diuretic to improve diuresis however now with low potassium of 2.9 I want to wait for potassium to get closer to normal before maximization of his diuresis.  He also can benefit from ARB or an ACE inhibitor but the key right now will be to diurese him.  He is hypoxic his oxygen today with 3 L was only 90%. 2. Essential hypertension: We facing up with the problem his blood pressure actually is a little on the lower side and I anticipate this to be biggest barrier in terms of maximization of his congestive heart failure medications. 3. Cardiomyopathy with ejection fraction 35-40%.  On beta-blocker and diuresis.  We will continue for now. 4. Lung cancer: Followed by oncology.  He is scheduled to have chemotherapy. 5. Multifocal atrial tachycardia which could be very frustrating rhythm.  I think for what we need to do right now is to concentrate on any factors that can increase his heart rate.  What I mean by that his oxygen need to be appropriately supplemented.  He is scheduled to have his chemotherapy tomorrow at 1130 I will see him then he may benefit  from slightly higher dose of beta-blocker however Baseline EKG showing first-degree AV block as well as the fact that he is relatively hypotensive making me somewhat reluctant to increase dose of the medication.  I will see how he looks tomorrow.   Medication Adjustments/Labs and Tests Ordered: Current medicines are reviewed at length with the patient today.  Concerns regarding medicines are outlined above.  No orders of the defined types were placed in this encounter.  Medication changes: No orders of the defined types were placed in this encounter.   Signed, Park Liter, MD, [facility] 12/07/2017 3:25 PM    [place]

## 2017-12-07 NOTE — Patient Instructions (Signed)
Medication Instructions:  Your physician recommends that you continue on your current medications as directed. Please refer to the Current Medication list given to you today.  Labwork: Lab with Oncology tomorrow  Testing/Procedures: EKG Today  Follow-Up: Your physician recommends that you schedule a follow-up appointment in: 2 weeks with Dr. Agustin Cree   Any Other Special Instructions Will Be Listed Below (If Applicable).     If you need a refill on your cardiac medications before your next appointment, please call your pharmacy.

## 2017-12-07 NOTE — Patient Instructions (Signed)
Implanted Port Insertion, Care After °This sheet gives you information about how to care for yourself after your procedure. Your health care provider may also give you more specific instructions. If you have problems or questions, contact your health care provider. °What can I expect after the procedure? °After your procedure, it is common to have: °· Discomfort at the port insertion site. °· Bruising on the skin over the port. This should improve over 3-4 days. ° °Follow these instructions at home: °Port care °· After your port is placed, you will get a manufacturer's information card. The card has information about your port. Keep this card with you at all times. °· Take care of the port as told by your health care provider. Ask your health care provider if you or a family member can get training for taking care of the port at home. A home health care nurse may also take care of the port. °· Make sure to remember what type of port you have. °Incision care °· Follow instructions from your health care provider about how to take care of your port insertion site. Make sure you: °? Wash your hands with soap and water before you change your bandage (dressing). If soap and water are not available, use hand sanitizer. °? Change your dressing as told by your health care provider. °? Leave stitches (sutures), skin glue, or adhesive strips in place. These skin closures may need to stay in place for 2 weeks or longer. If adhesive strip edges start to loosen and curl up, you may trim the loose edges. Do not remove adhesive strips completely unless your health care provider tells you to do that. °· Check your port insertion site every day for signs of infection. Check for: °? More redness, swelling, or pain. °? More fluid or blood. °? Warmth. °? Pus or a bad smell. °General instructions °· Do not take baths, swim, or use a hot tub until your health care provider approves. °· Do not lift anything that is heavier than 10 lb (4.5  kg) for a week, or as told by your health care provider. °· Ask your health care provider when it is okay to: °? Return to work or school. °? Resume usual physical activities or sports. °· Do not drive for 24 hours if you were given a medicine to help you relax (sedative). °· Take over-the-counter and prescription medicines only as told by your health care provider. °· Wear a medical alert bracelet in case of an emergency. This will tell any health care providers that you have a port. °· Keep all follow-up visits as told by your health care provider. This is important. °Contact a health care provider if: °· You cannot flush your port with saline as directed, or you cannot draw blood from the port. °· You have a fever or chills. °· You have more redness, swelling, or pain around your port insertion site. °· You have more fluid or blood coming from your port insertion site. °· Your port insertion site feels warm to the touch. °· You have pus or a bad smell coming from the port insertion site. °Get help right away if: °· You have chest pain or shortness of breath. °· You have bleeding from your port that you cannot control. °Summary °· Take care of the port as told by your health care provider. °· Change your dressing as told by your health care provider. °· Keep all follow-up visits as told by your health care provider. °  This information is not intended to replace advice given to you by your health care provider. Make sure you discuss any questions you have with your health care provider. °Document Released: 07/20/2013 Document Revised: 08/20/2016 Document Reviewed: 08/20/2016 °Elsevier Interactive Patient Education © 2017 [employer] ° °

## 2017-12-07 NOTE — Telephone Encounter (Signed)
Critical Value Potassium 2.9 Dr Marin Olp notified. No orders at this time

## 2017-12-07 NOTE — Progress Notes (Signed)
Hematology and Oncology Follow Up Visit  Harry Hinton [id number] May 10, 1933 84 y.o. 12/07/2017   Principle Diagnosis:  Metastatic adenocarcinoma of the left lung - PD-L1 (-)[place] Dark (+)  Current Therapy:    Carboplatin/Etoposide - s/p cycle #2        Radiation Therapy to the chest - concurrent - completed                     06/01/2017       Carboplatin/Alimta/Pembrolizumab - start on 11/30/2017     Interim History:  Harry Hinton is in for his follow-up.  He is having some issues.  He was recently in the hospital.  He had problems with cardiomyopathy.  Surprisingly, his echocardiogram showed that his ejection fraction went down to 35-40%.  Is having some atrial arrhythmias.  He had congestive heart failure.  He was seen by cardiology.  He was put on Coreg.  He is on oxygen.  He just does not look that great.  He has orthopnea.  I did an EKG on him today in the office.  It read atrial fibrillation with rapid ventricular response.  I went across the hall to 1 of our cardiologist.  He says that this is multifocal atrial tachycardia.  He did go ahead and see Harry Hinton.  He will help [place] try to get him to feel better.  I think we can just get his heart rate down a little bit, then he will have a better quality of life.  He was due to start treatment today.  Unfortunately, we will have to hold off until tomorrow.  He has had no bleeding.  He has had a lot of bruising.  There is no fever.  He has had some leg swelling but this is a little bit better.  Overall, his performance status is ECOG 2.  Medications:  Current Outpatient Medications:  .  albuterol (PROVENTIL HFA;VENTOLIN HFA) 108 (90 Base) MCG/ACT inhaler, Inhale 2 puffs into the lungs every 6 (six) hours as needed for wheezing or shortness of breath., Disp: 1 Inhaler, Rfl: 2 .  albuterol (PROVENTIL) (2.5 MG/3ML) 0.083% nebulizer solution, Take 3 mLs (2.5 mg total) by nebulization every 6 (six) hours as needed for wheezing or shortness of  breath., Disp: 75 mL, Rfl: 12 .  aspirin EC 81 MG tablet, Take 81 mg by mouth at bedtime. , Disp: , Rfl:  .  carvedilol (COREG) 3.125 MG tablet, Take 1 tablet (3.125 mg total) by mouth 2 (two) times daily with a meal., Disp: 60 tablet, Rfl: 0 .  dronabinol (MARINOL) 2.5 MG capsule, Take 1 capsule (2.5 mg total) by mouth 2 (two) times daily before lunch and supper., Disp: 120 capsule, Rfl: 0 .  furosemide (LASIX) 80 MG tablet, Take 1 tablet (80 mg total) by mouth daily., Disp: 30 tablet, Rfl: 0 .  Multiple Vitamin (MULTIVITAMIN WITH MINERALS) TABS tablet, Take 1 tablet by mouth daily. Centrum Silver, Disp: , Rfl:  .  potassium chloride SA (K-DUR,KLOR-CON) 20 MEQ tablet, Take 2 tablets (40 mEq total) by mouth 2 (two) times daily., Disp: 120 tablet, Rfl: 4 .  predniSONE (DELTASONE) 20 MG tablet, Take 1 tablet (20 mg total) by mouth daily., Disp: 30 tablet, Rfl: 0 No current facility-administered medications for this visit.   Facility-Administered Medications Ordered in Other Visits:  .  sodium chloride flush (NS) 0.9 % injection 10 mL, 10 mL, Intravenous, PRN, [place], Holli Humbles, NP, 10 mL at 11/13/17 1456  Allergies:  Allergies  Allergen Reactions  . Chlorhexidine Itching and Rash    Past Medical History, Surgical history, Social history, and Family History were reviewed and updated.  Review of Systems:  Review of Systems  Constitutional: Positive for malaise/fatigue and weight loss.  Respiratory: Positive for cough and shortness of breath.   Cardiovascular: Positive for palpitations, orthopnea and leg swelling.  Gastrointestinal: Negative.   Genitourinary: Negative.   Musculoskeletal: Positive for back pain.  Neurological: Positive for focal weakness and weakness.  Endo/Heme/Allergies: Negative.   Psychiatric/Behavioral: The patient is nervous/anxious.     Physical Exam: 98.1,  71, 154/68,  Wt is 184lb  Wt Readings from Last 3 Encounters:  12/07/17 168 lb (76.2 kg)  12/03/17  168 lb 6.4 oz (76.4 kg)  11/29/17 170 lb 14.4 oz (77.5 kg)    Physical Exam  Constitutional: He is oriented to person, place, and time.  HENT:  Head: Normocephalic and atraumatic.  Mouth/Throat: Oropharynx is clear and moist.  Eyes: EOM are normal. Pupils are equal, round, and reactive to light.  Neck: Normal range of motion.  Cardiovascular: Normal rate, regular rhythm and normal heart sounds.  Pulmonary/Chest: Effort normal and breath sounds normal.  Abdominal: Soft. Bowel sounds are normal.  Musculoskeletal: Normal range of motion. He exhibits no edema, tenderness or deformity.  Lymphadenopathy:    He has no cervical adenopathy.  Neurological: He is alert and oriented to person, place, and time.  Skin: Skin is warm and dry. No rash noted. No erythema.  Psychiatric: He has a normal mood and affect. His behavior is normal. Judgment and thought content normal.  Vitals reviewed.    Lab Results  Component Value Date   WBC 9.5 12/07/2017   HGB 10.7 (L) 11/25/2017   HCT 32.8 (L) 12/07/2017   MCV 94.5 12/07/2017   PLT 187 12/07/2017     Chemistry      Component Value Date/Time   NA 138 12/07/2017 1130   NA 136 10/02/2017 1022   K 2.9 (LL) 12/07/2017 1130   K 4.4 10/02/2017 1022   CL 86 (L) 12/07/2017 1130   CL 96 (L) 10/02/2017 1022   CO2 37 (H) 12/07/2017 1130   CO2 28 10/02/2017 1022   BUN 39 (H) 12/07/2017 1130   BUN 28 (H) 10/02/2017 1022   CREATININE 1.20 12/07/2017 1130   CREATININE 1.1 10/02/2017 1022      Component Value Date/Time   CALCIUM 8.9 12/07/2017 1130   CALCIUM 8.8 10/02/2017 1022   ALKPHOS 132 (H) 12/07/2017 1130   ALKPHOS 92 (H) 10/02/2017 1022   AST 56 (H) 12/07/2017 1130   ALT 77 (H) 12/07/2017 1130   ALT 30 10/02/2017 1022   BILITOT 0.6 12/07/2017 1130         Impression and Plan: Harry Hinton is a 84 year old white male. He had local advanced non-small cell lung cancer of the left lung.  He was treated with radiation/chemotherapy.   Unfortunately, he now has metastatic disease that is biopsy-proven.    For right now, we will not be able to treat him today.  Hopefully, we can treat him tomorrow.  Again, I think we can get his heart rate better, he will feel better.  I spent about 45 minutes with he and his family.  Over 50% of the time was spent face-to-face talking to him about the issues with his heart.  I went over his lab work.  We did the EKG.  This is incredibly complicated.  The heart issues  are totally unexpected and are making things very tricky as far as treatment for his cancer.    Volanda Napoleon,  MD 2/25/20192:53 PM

## 2017-12-08 ENCOUNTER — Inpatient Hospital Stay: Payer: Medicare Other

## 2017-12-08 ENCOUNTER — Other Ambulatory Visit: Payer: Self-pay

## 2017-12-08 ENCOUNTER — Other Ambulatory Visit: Payer: Self-pay | Admitting: *Deleted

## 2017-12-08 ENCOUNTER — Encounter: Payer: Self-pay | Admitting: Hematology & Oncology

## 2017-12-08 VITALS — BP 138/94 | HR 56 | Temp 97.5°F | Resp 18

## 2017-12-08 DIAGNOSIS — C3402 Malignant neoplasm of left main bronchus: Secondary | ICD-10-CM

## 2017-12-08 DIAGNOSIS — C3491 Malignant neoplasm of unspecified part of right bronchus or lung: Secondary | ICD-10-CM

## 2017-12-08 DIAGNOSIS — Z5112 Encounter for antineoplastic immunotherapy: Secondary | ICD-10-CM | POA: Diagnosis not present

## 2017-12-08 DIAGNOSIS — D500 Iron deficiency anemia secondary to blood loss (chronic): Secondary | ICD-10-CM

## 2017-12-08 DIAGNOSIS — D509 Iron deficiency anemia, unspecified: Secondary | ICD-10-CM

## 2017-12-08 LAB — CBC WITH DIFFERENTIAL (CANCER CENTER ONLY)
Basophils Absolute: 0 10*3/uL (ref 0.0–0.1)
Basophils Relative: 0 %
EOS ABS: 0 10*3/uL (ref 0.0–0.5)
Eosinophils Relative: 0 %
HCT: 33.3 % — ABNORMAL LOW (ref 38.7–49.9)
HEMOGLOBIN: 10.5 g/dL — AB (ref 13.0–17.1)
LYMPHS ABS: 0.4 10*3/uL — AB (ref 0.9–3.3)
LYMPHS PCT: 5 %
MCH: 29.9 pg (ref 28.0–33.4)
MCHC: 31.5 g/dL — AB (ref 32.0–35.9)
MCV: 94.9 fL (ref 82.0–98.0)
MONOS PCT: 5 %
Monocytes Absolute: 0.4 10*3/uL (ref 0.1–0.9)
Neutro Abs: 7.6 10*3/uL — ABNORMAL HIGH (ref 1.5–6.5)
Neutrophils Relative %: 90 %
Platelet Count: 189 10*3/uL (ref 145–400)
RBC: 3.51 MIL/uL — ABNORMAL LOW (ref 4.20–5.70)
RDW: 16.2 % — ABNORMAL HIGH (ref 11.1–15.7)
WBC Count: 8.4 10*3/uL (ref 4.0–10.0)

## 2017-12-08 LAB — CMP (CANCER CENTER ONLY)
ALK PHOS: 129 U/L — AB (ref 26–84)
ALT: 158 U/L — ABNORMAL HIGH (ref 10–47)
ANION GAP: 15 (ref 5–15)
AST: 119 U/L — ABNORMAL HIGH (ref 11–38)
Albumin: 2.6 g/dL — ABNORMAL LOW (ref 3.5–5.0)
BILIRUBIN TOTAL: 0.6 mg/dL (ref 0.2–1.6)
BUN: 41 mg/dL — AB (ref 7–22)
CHLORIDE: 90 mmol/L — AB (ref 98–108)
CO2: 36 mmol/L — ABNORMAL HIGH (ref 18–33)
Calcium: 9.4 mg/dL (ref 8.0–10.3)
Creatinine: 1 mg/dL (ref 0.60–1.20)
Glucose, Bld: 159 mg/dL — ABNORMAL HIGH (ref 73–118)
POTASSIUM: 4.5 mmol/L (ref 3.3–4.7)
SODIUM: 141 mmol/L (ref 128–145)
TOTAL PROTEIN: 6.6 g/dL (ref 6.4–8.1)

## 2017-12-08 LAB — SAMPLE TO BLOOD BANK

## 2017-12-08 MED ORDER — SODIUM CHLORIDE 0.9 % IV SOLN
Freq: Once | INTRAVENOUS | Status: AC
  Administered 2017-12-08: 13:00:00 via INTRAVENOUS

## 2017-12-08 MED ORDER — FOLIC ACID 1 MG PO TABS: 1.0000 mg | ORAL_TABLET | Freq: Every day | ORAL | 3 refills | Status: DC

## 2017-12-08 MED ORDER — PALONOSETRON HCL INJECTION 0.25 MG/5ML
INTRAVENOUS | Status: AC
  Filled 2017-12-08: qty 5

## 2017-12-08 MED ORDER — FOLIC ACID 1 MG PO TABS: ORAL_TABLET | ORAL | 3 refills | Status: DC

## 2017-12-08 MED ORDER — ONDANSETRON HCL 8 MG PO TABS: ORAL_TABLET | ORAL | 1 refills | Status: DC

## 2017-12-08 MED ORDER — PROCHLORPERAZINE MALEATE 10 MG PO TABS: 10.0000 mg | ORAL_TABLET | Freq: Four times a day (QID) | ORAL | 1 refills | Status: DC | PRN

## 2017-12-08 MED ORDER — LORAZEPAM 0.5 MG PO TABS: 0.5000 mg | ORAL_TABLET | Freq: Four times a day (QID) | ORAL | 0 refills | Status: DC | PRN

## 2017-12-08 MED ORDER — SODIUM CHLORIDE 0.9 % IV SOLN
200.0000 mg | Freq: Once | INTRAVENOUS | Status: AC
  Administered 2017-12-08: 200 mg via INTRAVENOUS
  Filled 2017-12-08: qty 8

## 2017-12-08 MED ORDER — FOSAPREPITANT DIMEGLUMINE INJECTION 150 MG
Freq: Once | INTRAVENOUS | Status: AC
  Administered 2017-12-08: 13:00:00 via INTRAVENOUS
  Filled 2017-12-08: qty 5

## 2017-12-08 MED ORDER — SODIUM CHLORIDE 0.9 % IV SOLN
510.0000 mg | Freq: Once | INTRAVENOUS | Status: AC
  Administered 2017-12-08: 510 mg via INTRAVENOUS
  Filled 2017-12-08: qty 17

## 2017-12-08 MED ORDER — ONDANSETRON HCL 8 MG PO TABS: 8.0000 mg | ORAL_TABLET | Freq: Two times a day (BID) | ORAL | 1 refills | Status: DC

## 2017-12-08 MED ORDER — SODIUM CHLORIDE 0.9 % IV SOLN
400.0000 mg/m2 | Freq: Once | INTRAVENOUS | Status: AC
  Administered 2017-12-08: 800 mg via INTRAVENOUS
  Filled 2017-12-08: qty 20

## 2017-12-08 MED ORDER — HEPARIN SOD (PORK) LOCK FLUSH 100 UNIT/ML IV SOLN
500.0000 [IU] | Freq: Once | INTRAVENOUS | Status: AC | PRN
  Administered 2017-12-08: 500 [IU]
  Filled 2017-12-08: qty 5

## 2017-12-08 MED ORDER — SODIUM CHLORIDE 0.9% FLUSH
10.0000 mL | INTRAVENOUS | Status: DC | PRN
Start: 2017-12-08 — End: 2017-12-08
  Administered 2017-12-08: 10 mL
  Filled 2017-12-08: qty 10

## 2017-12-08 MED ORDER — PALONOSETRON HCL INJECTION 0.25 MG/5ML
0.2500 mg | Freq: Once | INTRAVENOUS | Status: AC
  Administered 2017-12-08: 0.25 mg via INTRAVENOUS

## 2017-12-08 MED ORDER — DEXAMETHASONE 4 MG PO TABS
ORAL_TABLET | ORAL | 1 refills | Status: DC
Start: 2017-12-08 — End: 2017-12-09

## 2017-12-08 MED ORDER — SODIUM CHLORIDE 0.9 % IV SOLN
314.4000 mg | Freq: Once | INTRAVENOUS | Status: AC
  Administered 2017-12-08: 310 mg via INTRAVENOUS
  Filled 2017-12-08: qty 31

## 2017-12-08 NOTE — Patient Instructions (Signed)
[facility] Discharge Instructions for Patients Receiving Chemotherapy  Today you received the following chemotherapy agents:  Keytruda, Alimta and Carboplatin  To help prevent nausea and vomiting after your treatment, we encourage you to take your nausea medication as ordered per MD.    If you develop nausea and vomiting that is not controlled by your nausea medication, call the clinic.   BELOW ARE SYMPTOMS THAT SHOULD BE REPORTED IMMEDIATELY:  *FEVER GREATER THAN 100.5 F  *CHILLS WITH OR WITHOUT FEVER  NAUSEA AND VOMITING THAT IS NOT CONTROLLED WITH YOUR NAUSEA MEDICATION  *UNUSUAL SHORTNESS OF BREATH  *UNUSUAL BRUISING OR BLEEDING  TENDERNESS IN MOUTH AND THROAT WITH OR WITHOUT PRESENCE OF ULCERS  *URINARY PROBLEMS  *BOWEL PROBLEMS  UNUSUAL RASH Items with * indicate a potential emergency and should be followed up as soon as possible.  Feel free to call the clinic should you have any questions or concerns. The clinic phone number is [phone].  Please show the [facility] at check-in to the Emergency Department and triage nurse.

## 2017-12-09 ENCOUNTER — Telehealth: Payer: Self-pay | Admitting: *Deleted

## 2017-12-09 ENCOUNTER — Telehealth: Payer: Self-pay | Admitting: Hematology & Oncology

## 2017-12-09 LAB — IRON AND TIBC
Iron: 47 ug/dL (ref 42–163)
SATURATION RATIOS: 30 % — AB (ref 42–163)
TIBC: 156 ug/dL — ABNORMAL LOW (ref 202–409)
UIBC: 109 ug/dL

## 2017-12-09 LAB — FERRITIN: FERRITIN: 5459 ng/mL — AB (ref 22–316)

## 2017-12-09 NOTE — Telephone Encounter (Signed)
lvm to inform patient of lab/flush appt 12/14/17 at 1130 per sch msg

## 2017-12-09 NOTE — Telephone Encounter (Signed)
Patient is doing well. He's not had any problems or side effects since treatment. He is taking his medications as prescribed. He has all prn medications at home in case he develops symptoms.  Answered questions about each prn and new scheduled medication. Reviewed with Dr Marin Olp and since patient is already on prednisone, he will not start the decadron. Wife is aware and she will put that medication aside.  Patient is scheduled for a lab follow up on Monday. Wife is aware of appointment.

## 2017-12-10 ENCOUNTER — Encounter: Payer: Self-pay | Admitting: Hematology & Oncology

## 2017-12-11 DIAGNOSIS — J9691 Respiratory failure, unspecified with hypoxia: Secondary | ICD-10-CM

## 2017-12-11 DIAGNOSIS — J189 Pneumonia, unspecified organism: Secondary | ICD-10-CM | POA: Diagnosis not present

## 2017-12-11 DIAGNOSIS — E871 Hypo-osmolality and hyponatremia: Secondary | ICD-10-CM | POA: Diagnosis not present

## 2017-12-11 DIAGNOSIS — N179 Acute kidney failure, unspecified: Secondary | ICD-10-CM

## 2017-12-11 DIAGNOSIS — I509 Heart failure, unspecified: Secondary | ICD-10-CM

## 2017-12-11 DIAGNOSIS — R748 Abnormal levels of other serum enzymes: Secondary | ICD-10-CM

## 2017-12-11 DIAGNOSIS — D72819 Decreased white blood cell count, unspecified: Secondary | ICD-10-CM | POA: Diagnosis not present

## 2017-12-11 DIAGNOSIS — E875 Hyperkalemia: Secondary | ICD-10-CM

## 2017-12-11 DIAGNOSIS — R739 Hyperglycemia, unspecified: Secondary | ICD-10-CM

## 2017-12-12 DIAGNOSIS — J9600 Acute respiratory failure, unspecified whether with hypoxia or hypercapnia: Secondary | ICD-10-CM | POA: Diagnosis not present

## 2017-12-12 DIAGNOSIS — R748 Abnormal levels of other serum enzymes: Secondary | ICD-10-CM | POA: Diagnosis not present

## 2017-12-12 DIAGNOSIS — R0602 Shortness of breath: Secondary | ICD-10-CM

## 2017-12-12 DIAGNOSIS — I509 Heart failure, unspecified: Secondary | ICD-10-CM

## 2017-12-12 DIAGNOSIS — D72819 Decreased white blood cell count, unspecified: Secondary | ICD-10-CM | POA: Diagnosis not present

## 2017-12-13 DIAGNOSIS — J9690 Respiratory failure, unspecified, unspecified whether with hypoxia or hypercapnia: Secondary | ICD-10-CM

## 2017-12-13 DIAGNOSIS — J9600 Acute respiratory failure, unspecified whether with hypoxia or hypercapnia: Secondary | ICD-10-CM | POA: Diagnosis not present

## 2017-12-13 DIAGNOSIS — I509 Heart failure, unspecified: Secondary | ICD-10-CM

## 2017-12-13 DIAGNOSIS — C3482 Malignant neoplasm of overlapping sites of left bronchus and lung: Secondary | ICD-10-CM

## 2017-12-13 DIAGNOSIS — E43 Unspecified severe protein-calorie malnutrition: Secondary | ICD-10-CM

## 2017-12-13 DIAGNOSIS — I10 Essential (primary) hypertension: Secondary | ICD-10-CM

## 2017-12-13 DIAGNOSIS — E871 Hypo-osmolality and hyponatremia: Secondary | ICD-10-CM

## 2017-12-13 DIAGNOSIS — E876 Hypokalemia: Secondary | ICD-10-CM

## 2017-12-13 DIAGNOSIS — D72819 Decreased white blood cell count, unspecified: Secondary | ICD-10-CM | POA: Diagnosis not present

## 2017-12-13 DIAGNOSIS — I4891 Unspecified atrial fibrillation: Secondary | ICD-10-CM | POA: Diagnosis not present

## 2017-12-13 DIAGNOSIS — R748 Abnormal levels of other serum enzymes: Secondary | ICD-10-CM | POA: Diagnosis not present

## 2017-12-13 DIAGNOSIS — N179 Acute kidney failure, unspecified: Secondary | ICD-10-CM

## 2017-12-13 DIAGNOSIS — I2510 Atherosclerotic heart disease of native coronary artery without angina pectoris: Secondary | ICD-10-CM

## 2017-12-13 DIAGNOSIS — D61810 Antineoplastic chemotherapy induced pancytopenia: Secondary | ICD-10-CM

## 2017-12-13 DIAGNOSIS — C787 Secondary malignant neoplasm of liver and intrahepatic bile duct: Secondary | ICD-10-CM

## 2017-12-14 ENCOUNTER — Encounter: Payer: Self-pay | Admitting: Hematology & Oncology

## 2017-12-14 ENCOUNTER — Inpatient Hospital Stay: Payer: Medicare Other

## 2017-12-15 ENCOUNTER — Encounter: Payer: Self-pay | Admitting: Hematology & Oncology

## 2017-12-21 ENCOUNTER — Other Ambulatory Visit: Payer: Medicare Other

## 2017-12-21 ENCOUNTER — Ambulatory Visit: Payer: Medicare Other | Admitting: Hematology & Oncology

## 2017-12-21 ENCOUNTER — Ambulatory Visit: Payer: Medicare Other | Admitting: Cardiology

## 2017-12-21 ENCOUNTER — Ambulatory Visit: Payer: Medicare Other

## 2017-12-23 ENCOUNTER — Other Ambulatory Visit: Payer: Self-pay | Admitting: Nurse Practitioner

## 2017-12-28 ENCOUNTER — Ambulatory Visit: Payer: Medicare Other

## 2017-12-28 ENCOUNTER — Other Ambulatory Visit: Payer: Medicare Other

## 2017-12-28 ENCOUNTER — Ambulatory Visit: Payer: Medicare Other | Admitting: Hematology & Oncology

## 2018-01-11 DEATH — deceased

## 2018-01-18 ENCOUNTER — Other Ambulatory Visit: Payer: Medicare Other

## 2018-01-18 ENCOUNTER — Ambulatory Visit: Payer: Medicare Other | Admitting: Hematology & Oncology

## 2018-01-18 ENCOUNTER — Ambulatory Visit: Payer: Medicare Other

## 2018-01-22 ENCOUNTER — Ambulatory Visit: Payer: Medicare Other | Admitting: Cardiovascular Disease

## 2018-02-08 ENCOUNTER — Ambulatory Visit: Payer: Medicare Other | Admitting: Hematology & Oncology

## 2018-02-08 ENCOUNTER — Other Ambulatory Visit: Payer: Medicare Other

## 2018-02-08 ENCOUNTER — Ambulatory Visit: Payer: Medicare Other
